# Patient Record
Sex: Male | Born: 1970
Health system: Southern US, Community
[De-identification: ages and names within clinical notes are randomized; demographics above are authoritative.]

## PROBLEM LIST (undated history)

## (undated) DIAGNOSIS — F319 Bipolar disorder, unspecified: Secondary | ICD-10-CM

## (undated) DIAGNOSIS — E669 Obesity, unspecified: Secondary | ICD-10-CM

## (undated) DIAGNOSIS — S82891A Other fracture of right lower leg, initial encounter for closed fracture: Secondary | ICD-10-CM

## (undated) DIAGNOSIS — I10 Essential (primary) hypertension: Secondary | ICD-10-CM

## (undated) DIAGNOSIS — E785 Hyperlipidemia, unspecified: Secondary | ICD-10-CM

## (undated) DIAGNOSIS — G4733 Obstructive sleep apnea (adult) (pediatric): Secondary | ICD-10-CM

## (undated) DIAGNOSIS — G510 Bell's palsy: Secondary | ICD-10-CM

## (undated) DIAGNOSIS — G4730 Sleep apnea, unspecified: Secondary | ICD-10-CM

## (undated) HISTORY — PX: VASECTOMY: SHX75

## (undated) HISTORY — PX: KELOID EXCISION: SHX1856

## (undated) HISTORY — DX: Sleep apnea, unspecified: G47.30

## (undated) HISTORY — DX: Hyperlipidemia, unspecified: E78.5

---

## 1999-07-06 ENCOUNTER — Emergency Department (HOSPITAL_COMMUNITY): Admission: EM | Admit: 1999-07-06 | Discharge: 1999-07-06 | Payer: Self-pay | Admitting: *Deleted

## 1999-08-02 ENCOUNTER — Emergency Department (HOSPITAL_COMMUNITY): Admission: EM | Admit: 1999-08-02 | Discharge: 1999-08-02 | Payer: Self-pay | Admitting: Emergency Medicine

## 1999-08-02 ENCOUNTER — Encounter: Payer: Self-pay | Admitting: Emergency Medicine

## 2000-03-12 ENCOUNTER — Emergency Department (HOSPITAL_COMMUNITY): Admission: EM | Admit: 2000-03-12 | Discharge: 2000-03-12 | Payer: Self-pay | Admitting: Emergency Medicine

## 2000-03-12 ENCOUNTER — Encounter: Payer: Self-pay | Admitting: Emergency Medicine

## 2000-03-20 ENCOUNTER — Encounter: Admission: RE | Admit: 2000-03-20 | Discharge: 2000-03-20 | Payer: Self-pay | Admitting: Internal Medicine

## 2000-05-30 ENCOUNTER — Encounter: Admission: RE | Admit: 2000-05-30 | Discharge: 2000-05-30 | Payer: Self-pay | Admitting: Internal Medicine

## 2000-06-02 ENCOUNTER — Encounter: Admission: RE | Admit: 2000-06-02 | Discharge: 2000-06-02 | Payer: Self-pay | Admitting: Internal Medicine

## 2000-06-09 ENCOUNTER — Encounter: Admission: RE | Admit: 2000-06-09 | Discharge: 2000-06-09 | Payer: Self-pay | Admitting: Internal Medicine

## 2000-06-14 ENCOUNTER — Emergency Department (HOSPITAL_COMMUNITY): Admission: EM | Admit: 2000-06-14 | Discharge: 2000-06-14 | Payer: Self-pay | Admitting: Emergency Medicine

## 2000-06-22 ENCOUNTER — Encounter: Admission: RE | Admit: 2000-06-22 | Discharge: 2000-06-22 | Payer: Self-pay | Admitting: Hematology and Oncology

## 2000-06-23 ENCOUNTER — Ambulatory Visit (HOSPITAL_COMMUNITY): Admission: RE | Admit: 2000-06-23 | Discharge: 2000-06-23 | Payer: Self-pay | Admitting: *Deleted

## 2000-07-06 ENCOUNTER — Encounter: Admission: RE | Admit: 2000-07-06 | Discharge: 2000-07-06 | Payer: Self-pay | Admitting: Hematology and Oncology

## 2000-07-09 ENCOUNTER — Emergency Department (HOSPITAL_COMMUNITY): Admission: EM | Admit: 2000-07-09 | Discharge: 2000-07-09 | Payer: Self-pay | Admitting: Emergency Medicine

## 2000-07-13 ENCOUNTER — Encounter: Admission: RE | Admit: 2000-07-13 | Discharge: 2000-07-13 | Payer: Self-pay | Admitting: Hematology and Oncology

## 2000-07-18 ENCOUNTER — Encounter: Payer: Self-pay | Admitting: *Deleted

## 2000-07-18 ENCOUNTER — Ambulatory Visit (HOSPITAL_COMMUNITY): Admission: RE | Admit: 2000-07-18 | Discharge: 2000-07-18 | Payer: Self-pay | Admitting: *Deleted

## 2000-07-19 ENCOUNTER — Emergency Department (HOSPITAL_COMMUNITY): Admission: EM | Admit: 2000-07-19 | Discharge: 2000-07-19 | Payer: Self-pay | Admitting: Emergency Medicine

## 2000-07-19 ENCOUNTER — Encounter: Payer: Self-pay | Admitting: Emergency Medicine

## 2000-08-09 ENCOUNTER — Encounter: Admission: RE | Admit: 2000-08-09 | Discharge: 2000-08-09 | Payer: Self-pay | Admitting: Internal Medicine

## 2000-08-22 ENCOUNTER — Encounter: Payer: Self-pay | Admitting: Emergency Medicine

## 2000-08-22 ENCOUNTER — Emergency Department (HOSPITAL_COMMUNITY): Admission: EM | Admit: 2000-08-22 | Discharge: 2000-08-22 | Payer: Self-pay | Admitting: Emergency Medicine

## 2000-11-27 ENCOUNTER — Emergency Department (HOSPITAL_COMMUNITY): Admission: EM | Admit: 2000-11-27 | Discharge: 2000-11-27 | Payer: Self-pay | Admitting: Emergency Medicine

## 2000-12-08 ENCOUNTER — Emergency Department (HOSPITAL_COMMUNITY): Admission: EM | Admit: 2000-12-08 | Discharge: 2000-12-08 | Payer: Self-pay | Admitting: Emergency Medicine

## 2000-12-08 ENCOUNTER — Encounter: Payer: Self-pay | Admitting: Emergency Medicine

## 2001-02-21 ENCOUNTER — Emergency Department (HOSPITAL_COMMUNITY): Admission: EM | Admit: 2001-02-21 | Discharge: 2001-02-21 | Payer: Self-pay | Admitting: Emergency Medicine

## 2001-02-21 ENCOUNTER — Encounter: Payer: Self-pay | Admitting: Emergency Medicine

## 2001-05-19 ENCOUNTER — Encounter: Payer: Self-pay | Admitting: Emergency Medicine

## 2001-05-19 ENCOUNTER — Emergency Department (HOSPITAL_COMMUNITY): Admission: EM | Admit: 2001-05-19 | Discharge: 2001-05-19 | Payer: Self-pay | Admitting: Emergency Medicine

## 2001-05-24 ENCOUNTER — Encounter: Admission: RE | Admit: 2001-05-24 | Discharge: 2001-05-24 | Payer: Self-pay | Admitting: Nephrology

## 2001-05-24 ENCOUNTER — Encounter: Admission: RE | Admit: 2001-05-24 | Discharge: 2001-05-24 | Payer: Self-pay | Admitting: Internal Medicine

## 2001-05-24 ENCOUNTER — Encounter: Payer: Self-pay | Admitting: Nephrology

## 2001-08-12 ENCOUNTER — Emergency Department (HOSPITAL_COMMUNITY): Admission: EM | Admit: 2001-08-12 | Discharge: 2001-08-12 | Payer: Self-pay | Admitting: Emergency Medicine

## 2001-08-12 ENCOUNTER — Encounter: Payer: Self-pay | Admitting: Emergency Medicine

## 2001-12-04 ENCOUNTER — Ambulatory Visit (HOSPITAL_COMMUNITY): Admission: RE | Admit: 2001-12-04 | Discharge: 2001-12-04 | Payer: Self-pay | Admitting: Neurology

## 2001-12-04 ENCOUNTER — Encounter: Payer: Self-pay | Admitting: Neurology

## 2001-12-24 ENCOUNTER — Emergency Department (HOSPITAL_COMMUNITY): Admission: EM | Admit: 2001-12-24 | Discharge: 2001-12-24 | Payer: Self-pay | Admitting: Emergency Medicine

## 2001-12-24 ENCOUNTER — Encounter: Payer: Self-pay | Admitting: Emergency Medicine

## 2001-12-26 ENCOUNTER — Encounter: Payer: Self-pay | Admitting: Neurology

## 2001-12-26 ENCOUNTER — Encounter (INDEPENDENT_AMBULATORY_CARE_PROVIDER_SITE_OTHER): Payer: Self-pay | Admitting: *Deleted

## 2001-12-26 ENCOUNTER — Ambulatory Visit (HOSPITAL_COMMUNITY): Admission: RE | Admit: 2001-12-26 | Discharge: 2001-12-26 | Payer: Self-pay | Admitting: Neurology

## 2002-02-24 ENCOUNTER — Emergency Department (HOSPITAL_COMMUNITY): Admission: EM | Admit: 2002-02-24 | Discharge: 2002-02-24 | Payer: Self-pay | Admitting: Emergency Medicine

## 2002-04-09 ENCOUNTER — Emergency Department (HOSPITAL_COMMUNITY): Admission: EM | Admit: 2002-04-09 | Discharge: 2002-04-09 | Payer: Self-pay | Admitting: Emergency Medicine

## 2002-05-20 ENCOUNTER — Emergency Department (HOSPITAL_COMMUNITY): Admission: EM | Admit: 2002-05-20 | Discharge: 2002-05-21 | Payer: Self-pay | Admitting: Emergency Medicine

## 2002-05-20 ENCOUNTER — Encounter: Payer: Self-pay | Admitting: Emergency Medicine

## 2002-07-17 ENCOUNTER — Emergency Department (HOSPITAL_COMMUNITY): Admission: EM | Admit: 2002-07-17 | Discharge: 2002-07-17 | Payer: Self-pay | Admitting: Emergency Medicine

## 2002-07-24 ENCOUNTER — Encounter: Admission: RE | Admit: 2002-07-24 | Discharge: 2002-07-24 | Payer: Self-pay | Admitting: Internal Medicine

## 2003-04-07 ENCOUNTER — Emergency Department (HOSPITAL_COMMUNITY): Admission: EM | Admit: 2003-04-07 | Discharge: 2003-04-07 | Payer: Self-pay | Admitting: Emergency Medicine

## 2003-04-07 ENCOUNTER — Encounter: Payer: Self-pay | Admitting: Emergency Medicine

## 2003-05-25 ENCOUNTER — Emergency Department (HOSPITAL_COMMUNITY): Admission: AD | Admit: 2003-05-25 | Discharge: 2003-05-25 | Payer: Self-pay | Admitting: Emergency Medicine

## 2003-05-25 ENCOUNTER — Encounter: Payer: Self-pay | Admitting: Emergency Medicine

## 2003-06-06 ENCOUNTER — Emergency Department (HOSPITAL_COMMUNITY): Admission: EM | Admit: 2003-06-06 | Discharge: 2003-06-07 | Payer: Self-pay | Admitting: Emergency Medicine

## 2003-06-07 ENCOUNTER — Encounter: Payer: Self-pay | Admitting: Emergency Medicine

## 2003-07-21 ENCOUNTER — Emergency Department (HOSPITAL_COMMUNITY): Admission: EM | Admit: 2003-07-21 | Discharge: 2003-07-21 | Payer: Self-pay | Admitting: Emergency Medicine

## 2004-05-14 ENCOUNTER — Inpatient Hospital Stay (HOSPITAL_COMMUNITY): Admission: EM | Admit: 2004-05-14 | Discharge: 2004-05-18 | Payer: Self-pay | Admitting: Psychiatry

## 2004-05-16 ENCOUNTER — Encounter: Payer: Self-pay | Admitting: Emergency Medicine

## 2004-06-09 ENCOUNTER — Inpatient Hospital Stay (HOSPITAL_COMMUNITY): Admission: EM | Admit: 2004-06-09 | Discharge: 2004-06-10 | Payer: Self-pay | Admitting: Emergency Medicine

## 2004-09-28 ENCOUNTER — Emergency Department (HOSPITAL_COMMUNITY): Admission: EM | Admit: 2004-09-28 | Discharge: 2004-09-29 | Payer: Self-pay | Admitting: *Deleted

## 2005-07-26 ENCOUNTER — Emergency Department (HOSPITAL_COMMUNITY): Admission: EM | Admit: 2005-07-26 | Discharge: 2005-07-26 | Payer: Self-pay | Admitting: Family Medicine

## 2005-08-18 ENCOUNTER — Emergency Department (HOSPITAL_COMMUNITY): Admission: EM | Admit: 2005-08-18 | Discharge: 2005-08-18 | Payer: Self-pay | Admitting: Emergency Medicine

## 2006-02-14 ENCOUNTER — Emergency Department (HOSPITAL_COMMUNITY): Admission: EM | Admit: 2006-02-14 | Discharge: 2006-02-14 | Payer: Self-pay | Admitting: Family Medicine

## 2006-05-25 ENCOUNTER — Ambulatory Visit: Payer: Self-pay | Admitting: Family Medicine

## 2006-05-26 ENCOUNTER — Ambulatory Visit: Payer: Self-pay | Admitting: Family Medicine

## 2006-06-07 ENCOUNTER — Ambulatory Visit: Payer: Self-pay

## 2006-06-07 ENCOUNTER — Encounter: Admission: RE | Admit: 2006-06-07 | Discharge: 2006-09-05 | Payer: Self-pay | Admitting: Family Medicine

## 2006-06-07 ENCOUNTER — Encounter: Payer: Self-pay | Admitting: Cardiovascular Disease

## 2006-06-22 ENCOUNTER — Ambulatory Visit: Payer: Self-pay | Admitting: Family Medicine

## 2006-06-28 ENCOUNTER — Emergency Department (HOSPITAL_COMMUNITY): Admission: EM | Admit: 2006-06-28 | Discharge: 2006-06-28 | Payer: Self-pay | Admitting: Family Medicine

## 2006-07-20 ENCOUNTER — Encounter: Payer: Self-pay | Admitting: Pulmonary Disease

## 2006-07-20 ENCOUNTER — Ambulatory Visit (HOSPITAL_BASED_OUTPATIENT_CLINIC_OR_DEPARTMENT_OTHER): Admission: RE | Admit: 2006-07-20 | Discharge: 2006-07-20 | Payer: Self-pay | Admitting: Family Medicine

## 2006-07-26 ENCOUNTER — Ambulatory Visit: Payer: Self-pay | Admitting: Pulmonary Disease

## 2006-08-11 ENCOUNTER — Ambulatory Visit: Payer: Self-pay | Admitting: Family Medicine

## 2006-08-11 LAB — CONVERTED CEMR LAB
ALT: 26 units/L (ref 0–40)
AST: 23 units/L (ref 0–37)
Albumin: 3.6 g/dL (ref 3.5–5.2)
Alkaline Phosphatase: 63 units/L (ref 39–117)
BUN: 13 mg/dL (ref 6–23)
CO2: 29 meq/L (ref 19–32)
GFR calc non Af Amer: 80 mL/min
Glomerular Filtration Rate, Af Am: 97 mL/min/{1.73_m2}
MCHC: 33.3 g/dL (ref 30.0–36.0)
Monocytes Relative: 9.9 % (ref 3.0–11.0)
Platelets: 304 10*3/uL (ref 150–400)
RBC: 4.89 M/uL (ref 4.22–5.81)
RDW: 12.8 % (ref 11.5–14.6)
Total Bilirubin: 0.8 mg/dL (ref 0.3–1.2)
Total Protein: 7.6 g/dL (ref 6.0–8.3)

## 2006-08-31 ENCOUNTER — Ambulatory Visit: Payer: Self-pay | Admitting: Pulmonary Disease

## 2006-09-14 ENCOUNTER — Ambulatory Visit: Payer: Self-pay | Admitting: Family Medicine

## 2006-10-10 ENCOUNTER — Ambulatory Visit: Payer: Self-pay | Admitting: Family Medicine

## 2006-10-23 ENCOUNTER — Ambulatory Visit: Payer: Self-pay | Admitting: Family Medicine

## 2006-11-15 ENCOUNTER — Ambulatory Visit: Admission: RE | Admit: 2006-11-15 | Discharge: 2006-12-16 | Payer: Self-pay | Admitting: Radiation Oncology

## 2006-12-05 ENCOUNTER — Encounter (INDEPENDENT_AMBULATORY_CARE_PROVIDER_SITE_OTHER): Payer: Self-pay | Admitting: Specialist

## 2006-12-05 ENCOUNTER — Ambulatory Visit (HOSPITAL_BASED_OUTPATIENT_CLINIC_OR_DEPARTMENT_OTHER): Admission: RE | Admit: 2006-12-05 | Discharge: 2006-12-05 | Payer: Self-pay | Admitting: Otolaryngology

## 2006-12-28 ENCOUNTER — Ambulatory Visit: Payer: Self-pay | Admitting: Family Medicine

## 2007-01-03 ENCOUNTER — Encounter: Admission: RE | Admit: 2007-01-03 | Discharge: 2007-01-03 | Payer: Self-pay | Admitting: Family Medicine

## 2007-02-23 ENCOUNTER — Ambulatory Visit: Payer: Self-pay | Admitting: Family Medicine

## 2007-02-26 DIAGNOSIS — F209 Schizophrenia, unspecified: Secondary | ICD-10-CM | POA: Insufficient documentation

## 2007-02-26 DIAGNOSIS — F319 Bipolar disorder, unspecified: Secondary | ICD-10-CM | POA: Insufficient documentation

## 2007-02-26 DIAGNOSIS — G4730 Sleep apnea, unspecified: Secondary | ICD-10-CM | POA: Insufficient documentation

## 2007-02-27 ENCOUNTER — Encounter: Payer: Self-pay | Admitting: Family Medicine

## 2007-03-01 ENCOUNTER — Telehealth (INDEPENDENT_AMBULATORY_CARE_PROVIDER_SITE_OTHER): Payer: Self-pay | Admitting: *Deleted

## 2007-03-01 DIAGNOSIS — R809 Proteinuria, unspecified: Secondary | ICD-10-CM | POA: Insufficient documentation

## 2007-03-01 DIAGNOSIS — IMO0002 Reserved for concepts with insufficient information to code with codable children: Secondary | ICD-10-CM | POA: Insufficient documentation

## 2007-03-01 DIAGNOSIS — E1165 Type 2 diabetes mellitus with hyperglycemia: Secondary | ICD-10-CM

## 2007-03-01 LAB — CONVERTED CEMR LAB
AST: 20 units/L (ref 0–37)
Bilirubin, Direct: 0.1 mg/dL (ref 0.0–0.3)
Chloride: 108 meq/L (ref 96–112)
GFR calc non Af Amer: 101 mL/min
Glucose, Bld: 102 mg/dL — ABNORMAL HIGH (ref 70–99)
Hgb A1c MFr Bld: 6.6 % — ABNORMAL HIGH (ref 4.6–6.0)
Protein, Ur: 2050 mg/24hr — ABNORMAL HIGH (ref 50–100)
Sodium: 142 meq/L (ref 135–145)

## 2007-03-16 ENCOUNTER — Telehealth (INDEPENDENT_AMBULATORY_CARE_PROVIDER_SITE_OTHER): Payer: Self-pay | Admitting: *Deleted

## 2007-03-16 DIAGNOSIS — R079 Chest pain, unspecified: Secondary | ICD-10-CM | POA: Insufficient documentation

## 2007-03-27 ENCOUNTER — Telehealth (INDEPENDENT_AMBULATORY_CARE_PROVIDER_SITE_OTHER): Payer: Self-pay | Admitting: *Deleted

## 2007-03-29 ENCOUNTER — Ambulatory Visit: Payer: Self-pay | Admitting: Pulmonary Disease

## 2007-04-10 ENCOUNTER — Encounter: Payer: Self-pay | Admitting: Family Medicine

## 2007-04-19 ENCOUNTER — Encounter: Payer: Self-pay | Admitting: Family Medicine

## 2007-05-06 ENCOUNTER — Ambulatory Visit: Payer: Self-pay | Admitting: Psychiatry

## 2007-05-06 ENCOUNTER — Inpatient Hospital Stay (HOSPITAL_COMMUNITY): Admission: AD | Admit: 2007-05-06 | Discharge: 2007-05-08 | Payer: Self-pay | Admitting: Psychiatry

## 2007-05-06 ENCOUNTER — Emergency Department (HOSPITAL_COMMUNITY): Admission: EM | Admit: 2007-05-06 | Discharge: 2007-05-06 | Payer: Self-pay | Admitting: Emergency Medicine

## 2007-05-19 ENCOUNTER — Emergency Department (HOSPITAL_COMMUNITY): Admission: EM | Admit: 2007-05-19 | Discharge: 2007-05-19 | Payer: Self-pay | Admitting: Family Medicine

## 2007-05-22 ENCOUNTER — Encounter: Admission: RE | Admit: 2007-05-22 | Discharge: 2007-05-22 | Payer: Self-pay | Admitting: Nephrology

## 2007-05-25 ENCOUNTER — Telehealth (INDEPENDENT_AMBULATORY_CARE_PROVIDER_SITE_OTHER): Payer: Self-pay | Admitting: *Deleted

## 2007-05-30 ENCOUNTER — Encounter (INDEPENDENT_AMBULATORY_CARE_PROVIDER_SITE_OTHER): Payer: Self-pay | Admitting: *Deleted

## 2007-06-18 ENCOUNTER — Encounter: Payer: Self-pay | Admitting: Family Medicine

## 2007-07-02 ENCOUNTER — Emergency Department (HOSPITAL_COMMUNITY): Admission: EM | Admit: 2007-07-02 | Discharge: 2007-07-02 | Payer: Self-pay | Admitting: Emergency Medicine

## 2007-07-18 ENCOUNTER — Telehealth: Payer: Self-pay | Admitting: Family Medicine

## 2007-10-03 ENCOUNTER — Telehealth (INDEPENDENT_AMBULATORY_CARE_PROVIDER_SITE_OTHER): Payer: Self-pay | Admitting: *Deleted

## 2007-10-31 ENCOUNTER — Telehealth (INDEPENDENT_AMBULATORY_CARE_PROVIDER_SITE_OTHER): Payer: Self-pay | Admitting: *Deleted

## 2007-11-01 ENCOUNTER — Encounter: Payer: Self-pay | Admitting: Family Medicine

## 2007-11-01 ENCOUNTER — Encounter (INDEPENDENT_AMBULATORY_CARE_PROVIDER_SITE_OTHER): Payer: Self-pay | Admitting: *Deleted

## 2007-11-26 ENCOUNTER — Telehealth: Payer: Self-pay | Admitting: Family Medicine

## 2008-03-12 ENCOUNTER — Encounter: Payer: Self-pay | Admitting: Family Medicine

## 2008-05-13 ENCOUNTER — Telehealth (INDEPENDENT_AMBULATORY_CARE_PROVIDER_SITE_OTHER): Payer: Self-pay | Admitting: *Deleted

## 2008-08-18 ENCOUNTER — Emergency Department (HOSPITAL_COMMUNITY): Admission: EM | Admit: 2008-08-18 | Discharge: 2008-08-18 | Payer: Self-pay | Admitting: Family Medicine

## 2008-09-23 ENCOUNTER — Emergency Department (HOSPITAL_COMMUNITY): Admission: EM | Admit: 2008-09-23 | Discharge: 2008-09-23 | Payer: Self-pay | Admitting: Family Medicine

## 2008-10-17 ENCOUNTER — Ambulatory Visit: Payer: Self-pay | Admitting: Family Medicine

## 2008-10-18 ENCOUNTER — Encounter: Payer: Self-pay | Admitting: Family Medicine

## 2008-10-22 ENCOUNTER — Encounter (INDEPENDENT_AMBULATORY_CARE_PROVIDER_SITE_OTHER): Payer: Self-pay | Admitting: *Deleted

## 2008-10-30 ENCOUNTER — Encounter: Payer: Self-pay | Admitting: Family Medicine

## 2008-11-03 ENCOUNTER — Telehealth (INDEPENDENT_AMBULATORY_CARE_PROVIDER_SITE_OTHER): Payer: Self-pay | Admitting: *Deleted

## 2008-11-03 ENCOUNTER — Encounter (INDEPENDENT_AMBULATORY_CARE_PROVIDER_SITE_OTHER): Payer: Self-pay | Admitting: *Deleted

## 2008-11-14 ENCOUNTER — Ambulatory Visit: Payer: Self-pay | Admitting: Family Medicine

## 2008-11-14 DIAGNOSIS — E785 Hyperlipidemia, unspecified: Secondary | ICD-10-CM | POA: Insufficient documentation

## 2008-11-18 ENCOUNTER — Encounter (INDEPENDENT_AMBULATORY_CARE_PROVIDER_SITE_OTHER): Payer: Self-pay | Admitting: *Deleted

## 2008-11-18 ENCOUNTER — Encounter: Payer: Self-pay | Admitting: Family Medicine

## 2008-11-26 LAB — CONVERTED CEMR LAB: Protein, Ur: 2760 mg/24hr — ABNORMAL HIGH (ref 50–100)

## 2008-11-27 ENCOUNTER — Telehealth (INDEPENDENT_AMBULATORY_CARE_PROVIDER_SITE_OTHER): Payer: Self-pay | Admitting: *Deleted

## 2009-01-19 ENCOUNTER — Encounter: Payer: Self-pay | Admitting: Family Medicine

## 2009-02-04 ENCOUNTER — Encounter: Payer: Self-pay | Admitting: Family Medicine

## 2009-02-04 ENCOUNTER — Telehealth (INDEPENDENT_AMBULATORY_CARE_PROVIDER_SITE_OTHER): Payer: Self-pay | Admitting: *Deleted

## 2009-02-06 ENCOUNTER — Encounter: Payer: Self-pay | Admitting: Family Medicine

## 2009-03-13 ENCOUNTER — Emergency Department (HOSPITAL_COMMUNITY): Admission: EM | Admit: 2009-03-13 | Discharge: 2009-03-13 | Payer: Self-pay | Admitting: Emergency Medicine

## 2009-03-13 ENCOUNTER — Emergency Department (HOSPITAL_COMMUNITY): Admission: EM | Admit: 2009-03-13 | Discharge: 2009-03-13 | Payer: Self-pay | Admitting: Family Medicine

## 2009-03-16 ENCOUNTER — Telehealth (INDEPENDENT_AMBULATORY_CARE_PROVIDER_SITE_OTHER): Payer: Self-pay | Admitting: *Deleted

## 2009-03-17 ENCOUNTER — Telehealth (INDEPENDENT_AMBULATORY_CARE_PROVIDER_SITE_OTHER): Payer: Self-pay | Admitting: *Deleted

## 2009-03-19 ENCOUNTER — Encounter: Payer: Self-pay | Admitting: Family Medicine

## 2009-03-23 ENCOUNTER — Emergency Department (HOSPITAL_COMMUNITY): Admission: EM | Admit: 2009-03-23 | Discharge: 2009-03-23 | Payer: Self-pay | Admitting: Family Medicine

## 2009-05-18 ENCOUNTER — Emergency Department (HOSPITAL_COMMUNITY): Admission: EM | Admit: 2009-05-18 | Discharge: 2009-05-18 | Payer: Self-pay | Admitting: Family Medicine

## 2009-06-16 ENCOUNTER — Ambulatory Visit: Payer: Self-pay | Admitting: Family Medicine

## 2009-06-16 DIAGNOSIS — S93409A Sprain of unspecified ligament of unspecified ankle, initial encounter: Secondary | ICD-10-CM | POA: Insufficient documentation

## 2009-06-16 DIAGNOSIS — M1A0710 Idiopathic chronic gout, right ankle and foot, without tophus (tophi): Secondary | ICD-10-CM | POA: Insufficient documentation

## 2009-06-16 DIAGNOSIS — L02818 Cutaneous abscess of other sites: Secondary | ICD-10-CM | POA: Insufficient documentation

## 2009-06-16 DIAGNOSIS — L03818 Cellulitis of other sites: Secondary | ICD-10-CM

## 2009-06-17 ENCOUNTER — Telehealth (INDEPENDENT_AMBULATORY_CARE_PROVIDER_SITE_OTHER): Payer: Self-pay | Admitting: *Deleted

## 2009-06-17 ENCOUNTER — Encounter: Payer: Self-pay | Admitting: Family Medicine

## 2009-06-23 ENCOUNTER — Encounter: Payer: Self-pay | Admitting: Family Medicine

## 2009-06-24 ENCOUNTER — Telehealth: Payer: Self-pay | Admitting: Family Medicine

## 2009-06-24 LAB — CONVERTED CEMR LAB
ALT: 16 units/L (ref 0–53)
BUN: 9 mg/dL (ref 6–23)
Basophils Absolute: 0.1 10*3/uL (ref 0.0–0.1)
Bilirubin, Direct: 0.1 mg/dL (ref 0.0–0.3)
Chloride: 105 meq/L (ref 96–112)
Cholesterol: 242 mg/dL — ABNORMAL HIGH (ref 0–200)
Creatinine, Ser: 0.9 mg/dL (ref 0.4–1.5)
Eosinophils Absolute: 0.2 10*3/uL (ref 0.0–0.7)
Eosinophils Relative: 5.1 % — ABNORMAL HIGH (ref 0.0–5.0)
Glucose, Bld: 84 mg/dL (ref 70–99)
HCT: 43 % (ref 39.0–52.0)
Hgb A1c MFr Bld: 6.2 % (ref 4.6–6.5)
Lymphs Abs: 2.5 10*3/uL (ref 0.7–4.0)
MCHC: 32.6 g/dL (ref 30.0–36.0)
MCV: 88.3 fL (ref 78.0–100.0)
Microalb Creat Ratio: 695.4 mg/g — ABNORMAL HIGH (ref 0.0–30.0)
Microalb, Ur: 240.8 mg/dL — ABNORMAL HIGH (ref 0.0–1.9)
Monocytes Absolute: 0.9 10*3/uL (ref 0.1–1.0)
Neutrophils Relative %: 17 % — ABNORMAL LOW (ref 43.0–77.0)
Platelets: 207 10*3/uL (ref 150.0–400.0)
Potassium: 4.5 meq/L (ref 3.5–5.1)
RDW: 13 % (ref 11.5–14.6)
TSH: 2.7 microintl units/mL (ref 0.35–5.50)
Total Bilirubin: 0.8 mg/dL (ref 0.3–1.2)
Uric Acid, Serum: 9.3 mg/dL — ABNORMAL HIGH (ref 4.0–7.8)
VLDL: 51.6 mg/dL — ABNORMAL HIGH (ref 0.0–40.0)
WBC: 4.5 10*3/uL (ref 4.5–10.5)

## 2010-03-24 ENCOUNTER — Telehealth (INDEPENDENT_AMBULATORY_CARE_PROVIDER_SITE_OTHER): Payer: Self-pay | Admitting: *Deleted

## 2010-03-30 ENCOUNTER — Ambulatory Visit: Payer: Self-pay | Admitting: Family Medicine

## 2010-03-30 DIAGNOSIS — IMO0001 Reserved for inherently not codable concepts without codable children: Secondary | ICD-10-CM | POA: Insufficient documentation

## 2010-03-30 DIAGNOSIS — N529 Male erectile dysfunction, unspecified: Secondary | ICD-10-CM | POA: Insufficient documentation

## 2010-03-31 ENCOUNTER — Encounter: Payer: Self-pay | Admitting: Family Medicine

## 2010-04-01 ENCOUNTER — Telehealth (INDEPENDENT_AMBULATORY_CARE_PROVIDER_SITE_OTHER): Payer: Self-pay | Admitting: *Deleted

## 2010-04-01 DIAGNOSIS — R799 Abnormal finding of blood chemistry, unspecified: Secondary | ICD-10-CM | POA: Insufficient documentation

## 2010-04-01 LAB — CONVERTED CEMR LAB
Anti Nuclear Antibody(ANA): POSITIVE — AB
Rhuematoid fact SerPl-aCnc: <20 intl units/mL (ref 0–20)

## 2010-04-02 ENCOUNTER — Encounter: Payer: Self-pay | Admitting: Family Medicine

## 2010-04-06 ENCOUNTER — Telehealth (INDEPENDENT_AMBULATORY_CARE_PROVIDER_SITE_OTHER): Payer: Self-pay | Admitting: *Deleted

## 2010-04-06 ENCOUNTER — Encounter: Payer: Self-pay | Admitting: Family Medicine

## 2010-04-06 LAB — CONVERTED CEMR LAB
ALT: 20 units/L (ref 0–53)
Albumin: 3.5 g/dL (ref 3.5–5.2)
Alkaline Phosphatase: 71 units/L (ref 39–117)
Basophils Relative: 0.4 % (ref 0.0–3.0)
Bilirubin, Direct: 0.1 mg/dL (ref 0.0–0.3)
CO2: 27 meq/L (ref 19–32)
Calcium: 9 mg/dL (ref 8.4–10.5)
Chloride: 108 meq/L (ref 96–112)
Cholesterol: 240 mg/dL — ABNORMAL HIGH (ref 0–200)
Creatinine, Ser: 0.8 mg/dL (ref 0.4–1.5)
Creatinine,U: 219.5 mg/dL
Direct LDL: 125.1 mg/dL
Eosinophils Relative: 3.6 % (ref 0.0–5.0)
Hemoglobin: 13.9 g/dL (ref 13.0–17.0)
Lymphocytes Relative: 22 % (ref 12.0–46.0)
MCV: 88.3 fL (ref 78.0–100.0)
Microalb Creat Ratio: 61 mg/g — ABNORMAL HIGH (ref 0.0–30.0)
Neutro Abs: 5.3 10*3/uL (ref 1.4–7.7)
Neutrophils Relative %: 64.7 % (ref 43.0–77.0)
RBC: 4.63 M/uL (ref 4.22–5.81)
Sed Rate: 21 mm/hr (ref 0–22)
Sodium: 140 meq/L (ref 135–145)
Total CHOL/HDL Ratio: 7
Total Protein: 7.3 g/dL (ref 6.0–8.3)
VLDL: 60.4 mg/dL — ABNORMAL HIGH (ref 0.0–40.0)
WBC: 8.2 10*3/uL (ref 4.5–10.5)

## 2010-04-08 ENCOUNTER — Encounter: Payer: Self-pay | Admitting: Family Medicine

## 2010-04-08 ENCOUNTER — Telehealth: Payer: Self-pay | Admitting: Family Medicine

## 2010-04-20 ENCOUNTER — Encounter: Payer: Self-pay | Admitting: Family Medicine

## 2010-04-22 ENCOUNTER — Encounter: Payer: Self-pay | Admitting: Family Medicine

## 2010-05-18 ENCOUNTER — Encounter: Payer: Self-pay | Admitting: Cardiovascular Disease

## 2010-05-18 ENCOUNTER — Emergency Department (HOSPITAL_COMMUNITY): Admission: EM | Admit: 2010-05-18 | Discharge: 2010-05-18 | Payer: Self-pay | Admitting: Family Medicine

## 2010-05-19 ENCOUNTER — Ambulatory Visit: Payer: Self-pay | Admitting: Cardiovascular Disease

## 2010-05-19 DIAGNOSIS — R55 Syncope and collapse: Secondary | ICD-10-CM | POA: Insufficient documentation

## 2010-05-19 DIAGNOSIS — R072 Precordial pain: Secondary | ICD-10-CM | POA: Insufficient documentation

## 2010-05-20 ENCOUNTER — Telehealth (INDEPENDENT_AMBULATORY_CARE_PROVIDER_SITE_OTHER): Payer: Self-pay | Admitting: *Deleted

## 2010-06-06 ENCOUNTER — Emergency Department (HOSPITAL_COMMUNITY): Admission: EM | Admit: 2010-06-06 | Discharge: 2010-06-06 | Payer: Self-pay | Admitting: Emergency Medicine

## 2010-06-07 ENCOUNTER — Emergency Department (HOSPITAL_COMMUNITY): Admission: EM | Admit: 2010-06-07 | Discharge: 2010-06-07 | Payer: Self-pay | Admitting: Emergency Medicine

## 2010-06-09 ENCOUNTER — Encounter: Payer: Self-pay | Admitting: Family Medicine

## 2010-06-17 ENCOUNTER — Emergency Department (HOSPITAL_COMMUNITY): Admission: EM | Admit: 2010-06-17 | Discharge: 2010-06-17 | Payer: Self-pay | Admitting: Emergency Medicine

## 2010-07-29 ENCOUNTER — Encounter: Payer: Self-pay | Admitting: Family Medicine

## 2010-07-29 ENCOUNTER — Ambulatory Visit: Payer: Self-pay | Admitting: Family Medicine

## 2010-08-03 ENCOUNTER — Encounter: Payer: Self-pay | Admitting: Cardiovascular Disease

## 2010-08-03 ENCOUNTER — Ambulatory Visit (HOSPITAL_COMMUNITY): Admission: RE | Admit: 2010-08-03 | Discharge: 2010-08-03 | Payer: Self-pay | Admitting: Cardiovascular Disease

## 2010-08-03 ENCOUNTER — Ambulatory Visit: Payer: Self-pay | Admitting: Cardiology

## 2010-08-03 ENCOUNTER — Encounter: Payer: Self-pay | Admitting: Family Medicine

## 2010-08-04 ENCOUNTER — Ambulatory Visit: Payer: Self-pay | Admitting: Family Medicine

## 2010-08-10 LAB — CONVERTED CEMR LAB
Basophils Relative: 0.5 % (ref 0.0–3.0)
Eosinophils Absolute: 0.3 10*3/uL (ref 0.0–0.7)
Eosinophils Relative: 3.9 % (ref 0.0–5.0)
HCT: 39.2 % (ref 39.0–52.0)
Hemoglobin: 13.1 g/dL (ref 13.0–17.0)
Lymphs Abs: 1.7 10*3/uL (ref 0.7–4.0)
MCHC: 33.4 g/dL (ref 30.0–36.0)
MCV: 88.7 fL (ref 78.0–100.0)
Monocytes Absolute: 0.7 10*3/uL (ref 0.1–1.0)
Neutro Abs: 6 10*3/uL (ref 1.4–7.7)
RBC: 4.41 M/uL (ref 4.22–5.81)

## 2010-08-13 ENCOUNTER — Ambulatory Visit: Payer: Self-pay | Admitting: Pulmonary Disease

## 2010-08-13 DIAGNOSIS — G4733 Obstructive sleep apnea (adult) (pediatric): Secondary | ICD-10-CM | POA: Insufficient documentation

## 2010-09-06 ENCOUNTER — Ambulatory Visit: Payer: Self-pay | Admitting: Family Medicine

## 2010-09-24 ENCOUNTER — Encounter: Payer: Self-pay | Admitting: Family Medicine

## 2010-09-29 ENCOUNTER — Encounter: Payer: Self-pay | Admitting: Family Medicine

## 2010-09-30 ENCOUNTER — Encounter: Payer: Self-pay | Admitting: Family Medicine

## 2010-10-12 ENCOUNTER — Encounter: Payer: Self-pay | Admitting: Family Medicine

## 2010-10-16 ENCOUNTER — Encounter: Payer: Self-pay | Admitting: Cardiovascular Disease

## 2010-10-20 ENCOUNTER — Telehealth (INDEPENDENT_AMBULATORY_CARE_PROVIDER_SITE_OTHER): Payer: Self-pay | Admitting: *Deleted

## 2010-10-24 LAB — CONVERTED CEMR LAB
ALT: 23 units/L (ref 0–53)
AST: 19 units/L (ref 0–37)
Albumin: 3.4 g/dL — ABNORMAL LOW (ref 3.5–5.2)
Alkaline Phosphatase: 68 units/L (ref 39–117)
BUN: 7 mg/dL (ref 6–23)
Basophils Relative: 0.7 % (ref 0.0–3.0)
CO2: 29 meq/L (ref 19–32)
Chloride: 102 meq/L (ref 96–112)
Creatinine, Ser: 0.9 mg/dL (ref 0.4–1.5)
Creatinine,U: 245.5 mg/dL
Direct LDL: 146.7 mg/dL
Eosinophils Relative: 2.8 % (ref 0.0–5.0)
HDL: 27.1 mg/dL — ABNORMAL LOW (ref >39.0)
Lymphocytes Relative: 18.2 % (ref 12.0–46.0)
Neutrophils Relative %: 71.2 % (ref 43.0–77.0)
Nitrite: NEGATIVE
Potassium: 4.2 meq/L (ref 3.5–5.1)
Protein, U semiquant: >=300
RBC: 4.83 M/uL (ref 4.22–5.81)
Specific Gravity, Urine: >=1.03
TSH: 2.88 microintl units/mL (ref 0.35–5.50)
Total Bilirubin: 0.9 mg/dL (ref 0.3–1.2)
Total CHOL/HDL Ratio: 9.3
Urobilinogen, UA: NEGATIVE
VLDL: 63 mg/dL — ABNORMAL HIGH (ref 0–40)
WBC Urine, dipstick: NEGATIVE
WBC: 9.9 10*3/uL (ref 4.5–10.5)

## 2010-10-26 NOTE — Miscellaneous (Signed)
Summary: Appointment Canceled  Appointment status changed to canceled by [employer] on 06/09/2010 2:50 PM.  Cancellation Comments --------------------- wt 400/c r/s/780.2/mcalhany/humana/prec. req/ echo @ 10:30/saf  Appointment Information ----------------------- Appt Type:  CARDIOLOGY NUCLEAR TESTING      Date:  Thursday, June 10, 2010      Time:  8:15 AM for 15 min   Urgency:  Routine   Made By:  Pearson Grippe  To Visit:  [id number]    Reason:  wt 400/c r/s/780.2/mcalhany/humana/prec. req/ echo @ 10:30/saf  Appt Comments ------------- -- 06/09/10 14:50: ([facility]) CANCELED -- wt 400/c r/s/780.2/mcalhany/humana/prec. req/ echo @ 10:30/saf -- 06/09/10 12:28: ([facility]) BOOKED -- Routine CARDIOLOGY NUCLEAR TESTING at 06/10/2010 8:15 AM for 15 min wt 400/c r/s/780.2/mcalhany/humana/prec. req/ ec

## 2010-10-26 NOTE — Medication Information (Signed)
Summary: Diabetes Supplies/[facility]  Diabetes Supplies/[facility]   Imported By: Lanelle Bal 04/06/2010 12:43:33  _____________________________________________________________________  External Attachment:    Type:   Image     Comment:   External Document

## 2010-10-26 NOTE — Assessment & Plan Note (Signed)
Summary: PAINS IN JOINTS--FEET, ELBOW, NECK; ALSO SEE FOR ED, FASTING ...   Vital Signs:  Patient profile:   40 year old male Height:      75.5 inches Weight:      405.25 pounds BMI:     50.16 O2 Sat:      97 % on Room air Temp:     98.3 degrees F oral Pulse rate:   84 / minute BP sitting:   134 / 88  (right arm) Cuff size:   large  O2 Flow:  Room air CC: Est pt c/o joint pain x 2 weeks./kb Is Patient Diabetic? Yes Pain Assessment Patient in pain? yes     Location: knee Intensity: 4 Type: aching Onset of pain  6 months ago but worse x2 weeks Comments Patient also c/o inability to perform and neck spams nightly. Patient is taking only one Crestor, but is unsure of dosage./kb   History of Present Illness: Pt here c/o joint pains for about 6 months but the last 2 weeks it has been worse.  Pain is mostly in knees but is also in forearms and he c/o spasms in neck about 3 days a week.  It lasts 10 min and then goes away.    Type 1 diabetes mellitus follow-up      This is a 40 year old man who presents with Type 2 diabetes mellitus follow-up.  The patient denies polyuria, polydipsia, blurred vision, self managed hypoglycemia, hypoglycemia requiring help, weight loss, weight gain, and numbness of extremities.  The patient denies the following symptoms: neuropathic pain, chest pain, vomiting, orthostatic symptoms, poor wound healing, intermittent claudication, vision loss, and foot ulcer.  Since the last visit the patient reports good dietary compliance, compliance with medications, exercising regularly, and monitoring blood glucose.  The patient has been measuring capillary blood glucose before breakfast and at bedtime.  Since the last visit, the patient reports having had eye care by an ophthalmologist and foot care by a podiatrist.  Complications from diabetes include sexual dysfunction.    Hyperlipidemia follow-up      The patient also presents for Hyperlipidemia follow-up.  The  patient complains of muscle aches, but denies GI upset, abdominal pain, flushing, itching, constipation, diarrhea, and fatigue.  The patient denies the following symptoms: chest pain/pressure, exercise intolerance, dypsnea, palpitations, syncope, and pedal edema.  Compliance with medications (by patient report) has been near 100%.  Dietary compliance has been good.  The patient reports exercising 3-4X per week.    Hypertension follow-up      The patient also presents for Hypertension follow-up.  The patient complains of impotence, but denies lightheadedness, urinary frequency, headaches, edema, rash, and fatigue.  The patient denies the following associated symptoms: chest pain, chest pressure, exercise intolerance, dyspnea, palpitations, syncope, leg edema, and pedal edema.  Compliance with medications (by patient report) has been near 100%.  The patient reports that dietary compliance has been good.  The patient reports exercising 3-4X per week.  Adjunctive measures currently used by the patient include salt restriction.    Current Medications (verified): 1)  Freestyle Lite   Strp (Glucose Blood) .... Accu Check Tid 2)  Januvia 100 Mg Tabs (Sitagliptin Phosphate) .Marland Kitchen.. 1 By Mouth Once Daily 3)  Lisinopril 10 Mg Tabs (Lisinopril) .... Take 1 By Mouth Once Daily 4)  Crestor 40 Mg Tabs (Rosuvastatin Calcium) .Marland Kitchen.. 1 By Mouth At Bedtime 5)  Levitra 20 Mg Tabs (Vardenafil Hcl) .... As Directed  Allergies (verified): No Known  Drug Allergies  Past History:  Past medical, surgical, family and social histories (including risk factors) reviewed for relevance to current acute and chronic problems.  Past Medical History: Reviewed history from 11/14/2008 and no changes required. ? Lupus Diabetes mellitus, type II Current Problems:  BIPOLAR DISORDER UNSPECIFIED (ICD-296.80) DIABETES MELLITUS, TYPE II (ICD-250.00) CHEST PAIN, ACUTE (ICD-786.50) DM, UNCOMPLICATED, TYPE II (ICD-250.00) PROTEINURIA  (ICD-791.0) FAMILY HISTORY DIABETES 1ST DEGREE RELATIVE (ICD-V18.0) SLEEP APNEA (ICD-780.57) DISORDER, BIPOLAR NOS (ICD-296.80) SCHIZOPHRENIA (ICD-295.90) Hyperlipidemia  Past Surgical History: Reviewed history from 10/17/2008 and no changes required. keloid surgery Vasectomy  Family History: Reviewed history from 10/17/2008 and no changes required. Family History of Arthritis Family History Diabetes 1st degree relative Family History Hypertension Family History Other cancer-Throat Family History Heart Attack Family History Kidney Failure F--Pancreatic Cancer  Social History: Reviewed history from 10/17/2008 and no changes required. Married Current Smoker Occupation: disability Alcohol use-no Drug use-no Regular exercise-yes  Review of Systems      See HPI  Physical Exam  General:  Well-developed,well-nourished,in no acute distress; alert,appropriate and cooperative throughout examination Neck:  No deformities, masses, or tenderness noted. Lungs:  Normal respiratory effort, chest expands symmetrically. Lungs are clear to auscultation, no crackles or wheezes. Heart:  normal rate and no murmur.   Msk:  normal ROM, no joint tenderness, no joint swelling, no joint warmth, and no redness over joints.   Extremities:  No clubbing, cyanosis, edema, or deformity noted with normal full range of motion of all joints.    Diabetes Management Exam:    Foot Exam (with socks and/or shoes not present):       Sensory-Pinprick/Light touch:          Left medial foot (L-4): normal          Left dorsal foot (L-5): normal          Left lateral foot (S-1): normal          Right medial foot (L-4): normal          Right dorsal foot (L-5): normal          Right lateral foot (S-1): normal       Sensory-Monofilament:          Left foot: normal          Right foot: normal       Inspection:          Left foot: normal          Right foot: normal       Nails:          Left foot: thickened           Right foot: normal    Eye Exam:       Eye Exam done elsewhere   Impression & Recommendations:  Problem # 1:  MUSCLE PAIN (ICD-729.1) stop crestor for 2 weeks--we may need to change meds  tylenol arthritis  rto as needed  Orders: Venipuncture (96045) TLB-Lipid Panel (80061-LIPID) TLB-BMP (Basic Metabolic Panel-BMET) (80048-METABOL) TLB-CBC Platelet - w/Differential (85025-CBCD) TLB-Hepatic/Liver Function Pnl (80076-HEPATIC) TLB-TSH (Thyroid Stimulating Hormone) (84443-TSH) TLB-Uric Acid, Blood (84550-URIC) TLB-CK Total Only(Creatine Kinase/CPK) (82550-CK) TLB-A1C / Hgb A1C (Glycohemoglobin) (83036-A1C) TLB-Microalbumin/Creat Ratio, Urine (82043-MALB) TLB-Sedimentation Rate (ESR) (85652-ESR) T-Antinuclear Antib (ANA) (40981-19147) T-Vitamin D (25-Hydroxy) (82956-21308)  Problem # 2:  ERECTILE DYSFUNCTION, ORGANIC (ICD-607.84)  His updated medication list for this problem includes:    Levitra 20 Mg Tabs (Vardenafil hcl) .Marland Kitchen... As directed  Orders: Urology Referral (Urology)  Discussed proper  use of medications, as well as side effects.   Problem # 3:  GOUT, UNSPECIFIED (ICD-274.9)  Orders: Venipuncture (56213) TLB-Lipid Panel (80061-LIPID) TLB-BMP (Basic Metabolic Panel-BMET) (80048-METABOL) TLB-CBC Platelet - w/Differential (85025-CBCD) TLB-Hepatic/Liver Function Pnl (80076-HEPATIC) TLB-TSH (Thyroid Stimulating Hormone) (84443-TSH) TLB-Uric Acid, Blood (84550-URIC) TLB-CK Total Only(Creatine Kinase/CPK) (82550-CK) TLB-A1C / Hgb A1C (Glycohemoglobin) (83036-A1C) TLB-Microalbumin/Creat Ratio, Urine (82043-MALB)  Elevate extremity; warm compresses, symptomatic relief and medication as directed.   Problem # 4:  HYPERLIPIDEMIA (ICD-272.4)  The following medications were removed from the medication list:    Crestor 20 Mg Tabs (Rosuvastatin calcium) .Marland Kitchen... Take 1 by mouth at bedtime**labs due now** His updated medication list for this problem includes:    Crestor  40 Mg Tabs (Rosuvastatin calcium) .Marland Kitchen... 1 by mouth at bedtime  Orders: Venipuncture (08657) TLB-Lipid Panel (80061-LIPID) TLB-BMP (Basic Metabolic Panel-BMET) (80048-METABOL) TLB-CBC Platelet - w/Differential (85025-CBCD) TLB-Hepatic/Liver Function Pnl (80076-HEPATIC) TLB-TSH (Thyroid Stimulating Hormone) (84443-TSH) TLB-Uric Acid, Blood (84550-URIC) TLB-CK Total Only(Creatine Kinase/CPK) (82550-CK) TLB-A1C / Hgb A1C (Glycohemoglobin) (83036-A1C) TLB-Microalbumin/Creat Ratio, Urine (82043-MALB)  Labs Reviewed: SGOT: 17 (06/16/2009)   SGPT: 16 (06/16/2009)   HDL:25.00 (06/16/2009), 27.1 (10/17/2008)  LDL:DEL (10/17/2008)  Chol:242 (06/16/2009), 253 (10/17/2008)  Trig:258.0 (06/16/2009), 316 (10/17/2008)  Problem # 5:  DIABETES MELLITUS, TYPE II (ICD-250.00)  His updated medication list for this problem includes:    Januvia 100 Mg Tabs (Sitagliptin phosphate) .Marland Kitchen... 1 by mouth once daily    Lisinopril 10 Mg Tabs (Lisinopril) .Marland Kitchen... Take 1 by mouth once daily  Orders: Venipuncture (84696) TLB-Lipid Panel (80061-LIPID) TLB-BMP (Basic Metabolic Panel-BMET) (80048-METABOL) TLB-CBC Platelet - w/Differential (85025-CBCD) TLB-Hepatic/Liver Function Pnl (80076-HEPATIC) TLB-TSH (Thyroid Stimulating Hormone) (84443-TSH) TLB-Uric Acid, Blood (84550-URIC) TLB-CK Total Only(Creatine Kinase/CPK) (82550-CK) TLB-A1C / Hgb A1C (Glycohemoglobin) (83036-A1C) TLB-Microalbumin/Creat Ratio, Urine (82043-MALB)  Labs Reviewed: Creat: 0.9 (06/16/2009)    Reviewed HgBA1c results: 6.2 (06/16/2009)  7.4 (10/17/2008)  Complete Medication List: 1)  Freestyle Lite Strp (Glucose blood) .... Accu check tid 2)  Januvia 100 Mg Tabs (Sitagliptin phosphate) .Marland Kitchen.. 1 by mouth once daily 3)  Lisinopril 10 Mg Tabs (Lisinopril) .... Take 1 by mouth once daily 4)  Crestor 40 Mg Tabs (Rosuvastatin calcium) .Marland Kitchen.. 1 by mouth at bedtime 5)  Levitra 20 Mg Tabs (Vardenafil hcl) .... As directed  Patient Instructions: 1)   stop crestor for 2 weeks--- call us to let us know if muscle aches have improved 2)  Take tylenol arthritis for pain as needed ---as directed on bottle Prescriptions: LISINOPRIL 10 MG TABS (LISINOPRIL) Take 1 by mouth once daily  #30 x 5   Entered and Authorized by:   Loreen Freud DO   Signed by:   Loreen Freud DO on 03/30/2010   Method used:   Electronically to        [facility] (retail)       [address]       [place], [place]  [id number]       Ph: [phone]       Fax: [phone]   RxID:   [id number]
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
## 2010-10-26 NOTE — Progress Notes (Signed)
Summary: Diabetic Medications  Phone Note Outgoing Call Call back at [facility] Phone [phone]   Call placed by: Rea College, CMA,  May 20, 2010 3:59 PM Call placed to: Patient Reason for Call: Get patient information Summary of Call: I spoke with [name] yesterday and gave him instructions for his nuclear stress test on 06/10/10 with regards of medications.  He told be he was on diabetic meds., but there were none listed on his papers.  I checked EMR and Dr. Laury Axon listed [name], so I called the patient back and ask him about that med.  He said he would call his pharmacy and call me back.  He called back saying that it had been put on "hold" for some unknown reason, but that he was getting it filled today.  He has been off of his Januvia for about 1 month.     Appended Document: Diabetic Medications pt needed prior auth --- it was approved

## 2010-10-26 NOTE — Medication Information (Signed)
Summary: Approved Janumet / [employer]  Approved Janumet / [employer]   Imported By: Lennie Odor 04/21/2010 14:37:25  _____________________________________________________________________  External Attachment:    Type:   Image     Comment:   External Document

## 2010-10-26 NOTE — Medication Information (Signed)
Summary: Prior Authorization for Janumet/[employer]  Prior Authorization for Janumet/[employer]   Imported By: Lanelle Bal 04/14/2010 09:37:35  _____________________________________________________________________  External Attachment:    Type:   Image     Comment:   External Document

## 2010-10-26 NOTE — Progress Notes (Signed)
Summary: [facility]- Verbal order for DM supplies  Phone Note From Other Clinic Call back at [phone]   Caller: [facility] DM Testing Supplies Summary of Call: Message left on Dr.Lowne's assistants VM: Please call to give verbal order for DM testing supplies Ref # [id number]  I called [facility] and ok'd for DM supplies to be dispensed, testing 2x daily based on previous order for DM supplies./[facility]  March 24, 2010 2:46 PM

## 2010-10-26 NOTE — Progress Notes (Signed)
Summary: lab result  Phone Note Outgoing Call   Call placed by: [facility] CMA,  April 06, 2010 9:22 AM Details for Reason: Ideally your LDL (bad cholesterol) should be <_70___, your HDL (good cholesterol) should be >__40_ and your triglycerides should be< 150.  Diet and exercise will increase HDL and decrease the LDL and triglycerides. Read Dr. Danice Goltz book--Eat Drink and Be Healthy.  We will recheck labs in __3_ months. Secondary to joint pains and elevated cpk---stop crestor for 2 weeks.---recheck cpk 2 weeks start tricor 145mg  #30  1 by mouth once daily , 2 refills  Uric acid is high----start allopurinol 100mg  #30  1 by mouth once daily 2 refills---this is to help prevent gout  DM---not Alverda Skeans Alma Friendly to janumet 50/1000 #60  1 by mouth  two times a day ,  2refills    repeat labs in 3 months----250.00  272.4  bmp, hgba1c, lipid, hep,uric acid (dx gout)  Summary of Call: left message to call  office..............Marland KitchenFelecia Deloach CMA  April 06, 2010 9:23 AM   Follow-up for Phone Call        DISCUSS WITH PATIENT, letter mailed, rx sent to pharmacy............Marland KitchenFelecia Deloach CMA  April 07, 2010 12:39 PM     New/Updated Medications: JANUMET 50-1000 MG TABS (SITAGLIPTIN-METFORMIN HCL) Take 1 by mouth  two times a day TRICOR 145 MG TABS (FENOFIBRATE) Take 1 by mouth once daily ALLOPURINOL 100 MG TABS (ALLOPURINOL) Take 1 by mouth once daily to prevent gout Prescriptions: JANUMET 50-1000 MG TABS (SITAGLIPTIN-METFORMIN HCL) Take 1 by mouth  two times a day  #60 x 2   Entered by:   Jeremy Johann CMA   Authorized by:   Loreen Freud DO   Signed by:   Jeremy Johann CMA on 04/07/2010   Method used:   Faxed to ...       [facility] (retail)       [address]       [place], [place]  [id number]       Ph: [phone]       Fax: [phone]   RxID:   [id number] ALLOPURINOL 100 MG TABS (ALLOPURINOL) Take 1 by mouth once daily to prevent  gout  #30 x 2   Entered by:   Jeremy Johann CMA   Authorized by:   Loreen Freud DO   Signed by:   Jeremy Johann CMA on 04/07/2010   Method used:   Faxed to ...       [facility] (retail)       [address]       [place], [place]  [id number]       Ph: [phone]       Fax: [phone]   RxID:   [id number] TRICOR 145 MG TABS (FENOFIBRATE) Take 1 by mouth once daily  #30 x 2   Entered by:   Jeremy Johann CMA   Authorized by:   Loreen Freud DO   Signed by:   Jeremy Johann CMA on 04/07/2010   Method used:   Faxed to ...       [facility] [address] (retail)       [address]       [place], [place]  [id number]       Ph: [phone]       Fax: [phone]   RxID:   [id number]

## 2010-10-26 NOTE — Progress Notes (Signed)
Summary: [facility]  Phone Note From Pharmacy Call back at [phone]   Caller: [facility] Summary of Call: Message left on triage VM: Please call with update status on Vaccum device paperwork. Ref # [id number]   Shonna Chock  April 01, 2010 3:23 PM   left message to call  office.............Marland KitchenFelecia Deloach CMA  April 01, 2010 4:12 PM   Follow-up for Phone Call        per dr Laury Axon pt referred to urology. form faxed back indicated that............Marland KitchenFelecia Deloach CMA  April 02, 2010 10:48 AM

## 2010-10-26 NOTE — Letter (Signed)
Summary: Medical Report Form/[facility]  Medical Report Form/[facility]   Imported By: Lanelle Bal 08/05/2010 13:39:08  _____________________________________________________________________  External Attachment:    Type:   Image     Comment:   External Document

## 2010-10-26 NOTE — Letter (Signed)
[facility]: [facility]  [facility]   Imported By: Lanelle Bal 05/25/2010 09:28:43  _____________________________________________________________________  External Attachment:    Type:   Image     Comment:   External Document  Appended Document: [facility] please make sure labs were faxed to urology  Appended Document: [facility] labs faxed.

## 2010-10-26 NOTE — Medication Information (Signed)
Summary: [facility]  [facility]   Imported By: Lanelle Bal 04/12/2010 12:53:21  _____________________________________________________________________  External Attachment:    Type:   Image     Comment:   External Document

## 2010-10-26 NOTE — Letter (Signed)
Summary: CMN Request for Vacuum Erection System/[facility]  CMN Request for Vacuum Erection System/[facility]   Imported By: Lanelle Bal 04/09/2010 09:20:35  _____________________________________________________________________  External Attachment:    Type:   Image     Comment:   External Document

## 2010-10-26 NOTE — Consult Note (Signed)
Summary: Stacey Drain MD Rheumatology  Stacey Drain MD Rheumatology   Imported By: Lanelle Bal 05/05/2010 09:56:13  _____________________________________________________________________  External Attachment:    Type:   Image     Comment:   External Document
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
## 2010-10-26 NOTE — Progress Notes (Signed)
Summary: labs results  Phone Note Call from Patient Call back at Home Phone [phone]   Caller: Patient Summary of Call: Message left on Triage VM: Patient was returning call, I reviewed chart -? who called patient (no phone note on hold)    Chrae Malloy  April 01, 2010 2:35 PM   Follow-up for Phone Call        left message to call office. Marti Sleigh Deloach CMA  April 01, 2010 2:56 PM   -- ANA- elevated---refer to rheum  --low---take otc vita D3 2000u daily---recheck 3 months left message to call office.................Marland KitchenFelecia Deloach CMA  April 02, 2010 8:42 AM  DISCUSS WITH PATIENT............Marland KitchenFelecia Deloach CMA  April 05, 2010 11:57 AM

## 2010-10-26 NOTE — Letter (Signed)
Summary: [facility]   [facility]   Imported By: Roderic Ovens 05/25/2010 16:45:40  _____________________________________________________________________  External Attachment:    Type:   Image     Comment:   External Document

## 2010-10-26 NOTE — Miscellaneous (Signed)
Summary: Appointment Canceled  Appointment status changed to canceled by [employer] on 06/09/2010 2:50 PM.  Cancellation Comments --------------------- echo/780.2/humana/no prec req/stress @ 8:15/saf  Appointment Information ----------------------- Appt Type:  CARDIOLOGY ANCILLARY VISIT      Date:  Thursday, June 10, 2010      Time:  10:30 AM for 60 min   Urgency:  Routine   Made By:  Pearson Grippe  To Visit:  [id number]    Reason:  echo/780.2/humana/no prec req/stress @ 8:15/saf  Appt Comments ------------- -- 06/09/10 14:50: ([facility]) CANCELED -- echo/780.2/humana/no prec req/stress @ 8:15/saf -- 05/19/10 15:47: ([facility]) BOOKED -- Routine CARDIOLOGY ANCILLARY VISIT at 06/10/2010 10:30 AM for 60 min echo/780.2/humana/no prec req/stress @ 8:15/saf
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
## 2010-10-26 NOTE — Progress Notes (Signed)
Summary: Janumet PA  Phone Note Refill Request Call back at [phone] Message from:  Pharmacy on April 08, 2010 7:50 AM  Refills Requested: Medication #1:  JANUMET 50-1000 MG TABS Take 1 by mouth  two times a day   Dosage confirmed as above?Dosage Confirmed   Supply Requested: 1 month [facility] [address] PRIOR AUTH: [id number]  Next Appointment Scheduled: none Initial call taken by: Lavell Islam,  April 08, 2010 7:51 AM  Follow-up for Phone Call        Form requested. Lucious Groves CMA  April 08, 2010 9:26 AM   Form completed and faxed, will await insurance company reply. Lucious Groves CMA  April 08, 2010 11:21 AM      Appended Document: Janumet PA Called insurance company to check status and is approved until 2013.

## 2010-10-28 ENCOUNTER — Telehealth: Payer: Self-pay | Admitting: Family Medicine

## 2010-10-28 NOTE — Medication Information (Signed)
Summary: [facility]  [facility]   Imported By: Lanelle Bal 10/05/2010 12:48:00  _____________________________________________________________________  External Attachment:    Type:   Image     Comment:   External Document

## 2010-10-28 NOTE — Medication Information (Signed)
Summary: [facility]  [facility]   Imported By: Lanelle Bal 10/08/2010 13:48:05  _____________________________________________________________________  External Attachment:    Type:   Image     Comment:   External Document

## 2010-10-28 NOTE — Letter (Signed)
Summary: [facility]  [facility]   Imported By: Lanelle Bal 10/22/2010 10:23:14  _____________________________________________________________________  External Attachment:    Type:   Image     Comment:   External Document

## 2010-10-28 NOTE — Progress Notes (Signed)
Summary: cpap order--order sent to [facility]  Phone Note Call from Patient Call back at Home Phone [phone]   Caller: Patient Call For: clance Summary of Call: Needs an order sent to [facility] for his cpap machine. Initial call taken by: Darletta Moll,  October 20, 2010 9:03 AM  Follow-up for Phone Call        Spoke with pt.  He states that back in November when [facility] came out to bring him his new mask and do download, they advised that his machine was too old and his humidifier was not working so they said he needs order for a new machine.  Pls advise thanks. Follow-up by: Vernie Murders,  October 20, 2010 9:13 AM  Additional Follow-up for Phone Call Additional follow up Details #1::        will send an order to [facility] for this.  Let pt know. Additional Follow-up by: Barbaraann Share MD,  October 20, 2010 5:22 PM    Additional Follow-up for Phone Call Additional follow up Details #2::    atc pt to inform him order was sent. Was unable to leave vm due to "the subsciber i have reached is out of the area". Will try back later [facility]  October 20, 2010 5:26 PM   Pt is aware order has been sent to [facility] per Dr. Shelle Iron.Michel Bickers CMA  October 21, 2010 9:12 AM

## 2010-10-28 NOTE — Consult Note (Signed)
Summary: [facility]  [facility]   Imported By: Lanelle Bal 10/12/2010 10:58:25  _____________________________________________________________________  External Attachment:    Type:   Image     Comment:   External Document

## 2010-10-28 NOTE — Progress Notes (Signed)
Summary: [facility]   Imported By: Sherian Rein 09/06/2010 10:14:36  _____________________________________________________________________  External Attachment:    Type:   Image     Comment:   External Document

## 2010-11-03 NOTE — Progress Notes (Signed)
Summary: letter for [facility]mv  Phone Note Call from Patient Call back at Home Phone [phone]   Caller: Patient Summary of Call: patient wants dr Laury Axon to send another letter to [facility]mv stating he can drive - he said he was cleared by neuro & cardio Initial call taken by: Okey Regal Spring,  October 28, 2010 1:50 PM  Follow-up for Phone Call        please advise Follow-up by: Almeta Monas CMA Duncan Dull),  October 28, 2010 4:56 PM  Additional Follow-up for Phone Call Additional follow up Details #1::        only notes I have from Neuro says not to drive----I need notes from cardio and neuro Additional Follow-up by: Loreen Freud DO,  October 28, 2010 8:18 PM    Additional Follow-up for Phone Call Additional follow up Details #2::    I made pt aware and advised him to have the other offices fax [place] information stating he can operate a Motor Vehicle, I gave him the fax number to send to [place] and he voiced understanding....  Follow-up by: Almeta Monas CMA (AAMA),  October 29, 2010 8:20 AM

## 2010-11-08 ENCOUNTER — Ambulatory Visit: Payer: Self-pay | Admitting: Family Medicine

## 2010-11-11 ENCOUNTER — Telehealth (INDEPENDENT_AMBULATORY_CARE_PROVIDER_SITE_OTHER): Payer: Self-pay | Admitting: *Deleted

## 2010-11-17 NOTE — Progress Notes (Signed)
Summary: Results---  Phone Note Outgoing Call   Call placed by: Almeta Monas CMA Duncan Dull),  November 11, 2010 1:20 PM Call placed to: Patient Summary of Call: [facility] came back 08/23/10 and I have contacted patient several times but he had No Showed or cancelled appts to review.... I will mail a copy of the [facility] t the patient but he will need to come in to pick up his booklet. RX Crestor 20mg  was recommended by Dr.Lowne and has been sent along with the lab results for patient to start....... Both mailed Initial call taken by: Almeta Monas CMA (AAMA),  November 11, 2010 1:22 PM    New/Updated Medications: CRESTOR 20 MG TABS (ROSUVASTATIN CALCIUM) 1 by mouth at bedtime-recheck labs in 3 mos Prescriptions: CRESTOR 20 MG TABS (ROSUVASTATIN CALCIUM) 1 by mouth at bedtime-recheck labs in 3 mos  #30 x 2   Entered by:   Almeta Monas CMA (AAMA)   Authorized by:   Loreen Freud DO   Signed by:   Almeta Monas CMA (AAMA) on 11/11/2010   Method used:   Print then Give to Patient   RxID:   [id number]

## 2010-12-09 LAB — POCT I-STAT, CHEM 8
Hemoglobin: 16.3 g/dL (ref 13.0–17.0)
Sodium: 140 mEq/L (ref 135–145)
TCO2: 26 mmol/L (ref 0–100)

## 2010-12-27 ENCOUNTER — Other Ambulatory Visit: Payer: Self-pay | Admitting: Pulmonary Disease

## 2010-12-27 DIAGNOSIS — G4733 Obstructive sleep apnea (adult) (pediatric): Secondary | ICD-10-CM

## 2010-12-28 ENCOUNTER — Encounter: Payer: Self-pay | Admitting: Family Medicine

## 2011-01-03 LAB — POCT URINALYSIS DIP (DEVICE)
Bilirubin Urine: NEGATIVE
Glucose, UA: NEGATIVE mg/dL
Nitrite: NEGATIVE
Urobilinogen, UA: 0.2 mg/dL (ref 0.0–1.0)
pH: 5.5 (ref 5.0–8.0)

## 2011-02-08 NOTE — Assessment & Plan Note (Signed)
[facility]                             PULMONARY OFFICE NOTE   OZIAS, DICENZO                      MRN:          [id number]  DATE:03/29/2007                            DOB:          11-14-1970    SUBJECTIVE:  Mr. Loftin comes in today for followup of his obstructive  sleep apnea.  He has been wearing his CPAP most nights and is having no  difficulties with the mask or pressure.  He feels that he is sleeping  better on a more consistent basis, and has noticed increased alertness.  He is going to need surgical clearance for gastric bypass.   PHYSICAL EXAMINATION:  IN GENERAL:  He is an obese male in no acute  distress.  Blood pressure is 120/98, pulse is 72, temperature is 98.0, weight is  409 pounds.  O2 saturation on room air is 93%.  There is no evidence of skin breakdown or pressure necrosis from the  CPAP mask.   IMPRESSION:  Severe obstructive sleep apnea which is better with CPAP  therapy.  The patient is having no difficulty with mask or pressure.  At  this point in time he needs to have his pressure optimized, and will  arrange for an auto titrate device as a loaner for 2 weeks with  download.  From a surgical standpoint, he should have no difficulty with  his gastric surgery with respect to his sleep apnea.  I have asked him  to take his mask and head gear with him to his surgery, but hospital  policy dictates that he will have to use their machine.  I would be  happy to see him in the postoperative period if needed.   PLAN:  1. Auto titrate device for 2 weeks with download for pressure      optimization.  I will call the patient and let him know.  2. Work on weight loss.  3. He will follow up in 6 months or sooner if there are problems.     Barbaraann Share, MD,FCCP  Electronically Signed    [facility]/MedQ  DD: 03/29/2007  DT: 03/29/2007  Job #: [id number]   cc:   Lelon Perla, DO

## 2011-02-08 NOTE — Discharge Summary (Signed)
[name], [name]               ACCOUNT NO.:  [id number]   MEDICAL RECORD NO.:  [id number]          PATIENT TYPE:  IPS   LOCATION:  0507                          [facility]ILITY[facility]   PHYSICIAN:  Geoffery Lyons, M.D.      DATE OF BIRTH:  1971/05/07   DATE OF ADMISSION:  05/06/2007  DATE OF DISCHARGE:  05/08/2007                               DISCHARGE SUMMARY   CHIEF COMPLAINT AND PRESENT ILLNESS:  This was the second admission to  [facility] for this 40 year old married African-  American male voluntarily admitted.  History of intentional overdose.  Took Aricept, his medication, about 20 tablets.  Endorsed it was very  impulsive.  Felt he didn't need to be here.  Left a note for his wife  who was working nights and found him the next day and called EMS.  On  this evaluation though he was glad he was still alive.  He took himself  off all his medication nine months prior to this admission because it  was making him too sedated.  Off his medication, felt he was doing well.   PAST PSYCHIATRIC HISTORY:  Only admission two years prior to this  admission due to depression.  Had a history of overdosing at age 40.  Was also an outpatient at [facility].  Has been on Wellbutrin,  Aricept and Abilify.   ALCOHOL/DRUG HISTORY:  Claims that he drinks alcohol on rare occasions.  Denies any abuse.   MEDICAL HISTORY:  Hypertension, hypercholesterolemia, sleep apnea, on a  C-PAP machine.   MEDICATIONS:  Has been on Lipitor.   PHYSICAL EXAMINATION:  Performed and failed to show any acute findings.   LABORATORY DATA:  CMET within normal limits.  Alcohol level less than 5.  CBC within normal limits.  Urinalysis negative.  UDS was negative.   MENTAL STATUS EXAM:  Fully alert, cooperative male, fair eye contact.  Casually dressed, appropriately groomed.  Speech is clear, normal in  pace, tempo and production.  Mood endorsed he was feeling stupid for  what he did.  Affect was  polite, pleasant, broad.  Thought processes are  logical, coherent and relevant.  There is no evidence of delusions.  No  active suicidal or homicidal ideation.  No hallucinations.  Cognition  well-preserved.   ADMISSION DIAGNOSES:  AXIS I:  Major depressive disorder.  AXIS II:  No diagnosis.  AXIS III:  Hypertension, sleep apnea, elevated cholesterol.  AXIS IV:  Moderate.  AXIS V:  GAF upon admission 35; highest GAF in the last year 65-70.   HOSPITAL COURSE:  He was admitted.  He was started in individual and  group psychotherapy.  He was on Crestor but last seen in January and  lisinopril/hydrochlorothiazide on hold since June so he has not been  taking any medications.  He admitted that he felt he did something  stupid.  The wife told him she wanted to leave.  He got very upset and  took 20 of his pills.  Has two children, ages 74 and 69.  He is on  disability, working a  few hours in the post office.  Endorsed he did  really well for a long time.  Was able to come off his medications.  After the incident with his wife telling him she had found someone else,  he became very upset.  Has not been able to sleep, constantly thinking,  ruminating about what happened with the wife.   As already stated, Earnestine Leys in 2005.  No current  follow-up.  Initially depressed, tearful, upset but denied any suicidal  ideas.  Endorsed that he found out by being in the hospital that there  were people who really liked him and loved him.  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  We tried to schedule a session with his wife  to address some of their issues.  There was a family session with his  wife.  Wife endorsed that they had a lot of work to do between them once  he got home but apparently her perception of things was that he took a  whole lot of nothing and made it into something.  He expressed to her  how vulnerable he felt.  He also endorsed that he learned that   overdosing would not accomplish anything and, if his wife and he did  not make it, he would just have to learn how to move on.  On May 08, 2007, he was in full contact with reality.  Endorsing no suicidal or  homicidal ideation.  No hallucinations.  No delusions.  The wife was  going to be willing to allow him to stay in the house in separate rooms  and he was clear in that they might not get back together but he felt he  was ready to work on the relationship or, if it was not going to work  out, he was ready to let go.   DISCHARGE DIAGNOSES:  AXIS I:  Adjustment disorder with depressed mood.  AXIS II:  No diagnosis.  AXIS III:  Hypertension, sleep apnea, hypercholesterolemia.  AXIS IV:  Moderate.  AXIS V:  GAF upon discharge 55-60.   DISCHARGE MEDICATIONS:  None.   FOLLOWUP:  Going to pursue counseling through [facility].      Geoffery Lyons, M.D.  Electronically Signed     IL/MEDQ  D:  05/29/2007  T:  05/29/2007  Job:  [id number]

## 2011-02-08 NOTE — H&P (Signed)
[name], [name]               ACCOUNT NO.:  [id number]   MEDICAL RECORD NO.:  [id number]          PATIENT TYPE:  IPS   LOCATION:  0507                          [facility]ACILITY[facility]  [facility]   PHYSICIAN:  [name], M.D.      DATE OF BIRTH:  January 07, 1971   DATE OF ADMISSION:  05/06/2007  DATE OF DISCHARGE:  05/08/2007                       PSYCHIATRIC ADMISSION ASSESSMENT   IDENTIFYING INFORMATION:  This is a 41 year old married African-American  male voluntarily admitted on May 06, 2007.   HISTORY OF PRESENT ILLNESS:  The patient presents with a history of  intentional overdose.  The patient reports that he took Aricept, his  medications, about 20 tablets on Saturday.  States it was very  impulsive.  His intention was that he didn't think he needed to be  here.  He had left a note for his wife.  Has wife was working nights  and found him the next day and had called EMS.  Per chart, medication  identified as an overdose was Risperdal but the patient believes that he  overdosed on Aricept.  Today, he is glad he is still here.  He states  he took himself off all of his medication about nine months ago because  it was making him sedated and he states, after he was off his  medications, he felt he was doing well.  He had gotten himself a job.  He denies any hallucinations.  Denies any substance abuse.   PAST PSYCHIATRIC HISTORY:  The patient was here about two years ago for  depressive symptoms.  Has a history of overdosing in the past at the age  of 71.  He was hospitalized at that time.  Was an outpatient at [facility].  In the past, has been on Wellbutrin, Aricept and  Abilify.   SOCIAL HISTORY:  This is a 40 year old African-American male.  Married  for two years.  This is his second marriage.  He has three children.  He  lives with his wife and two youngest child and no legal problems.   FAMILY HISTORY:  Mother on depressive medication.   ALCOHOL/DRUG HISTORY:  The  patient smokes.  Drinks alcohol on rare  occasions.  Denies any drug use.   PRIMARY CARE PHYSICIAN:  Primary care [name] is Dr. Deatra [name] in [place]  on [facility] at the [facility].   MEDICAL PROBLEMS:  Hypertension, hypercholesteremia, sleep apnea, is on  the C-PAP machine.   MEDICATIONS:  Has been on Lipitor for his cholesterol.  Again in the  past has been on Wellbutrin, Aricept and Abilify.   ALLERGIES:  ASPIRIN (he states he gets a nosebleed).   PHYSICAL EXAMINATION:  The patient was assessed at [facility].  This is a morbidly obese male with no acute distress.  His temperature  is 99.2, heart rate 87, respirations 20, blood pressure 138/92.   LABORATORY DATA:  Urine drug screen was negative.  CMET is within normal  limits.  Alcohol level less than 5.  CBC within normal limits.  Urinalysis is negative.  This is a fully alert, cooperative  male.  Fair  eye contact.  He is casually dressed.  His speech is clear, normal pace  and tone.  The patient's mood, he feels stupid today.  Affect is  polite, pleasant.  Thought processes are coherent.  There is no evidence  of any thought disorder.  Cognitive function intact.  Memory is good.  Judgment is fair.  Insight is fair.   DIAGNOSES:  AXIS I:  Depressive disorder not otherwise specified.  AXIS II:  Deferred.  AXIS III:  Hypertension, sleep apnea and elevated cholesterol.  AXIS IV:  Psychosocial problems, possible medical problems.  AXIS V:  Current 35.   PLAN:  Contract for safety.  Stabilize mood and thinking.  Will check  his pulse ox.  Will obtain a C-PAP machine for the patient.  Will have a  family session with wife.  Will consider an antidepressant.  The patient  will follow up with mental health.   TENTATIVE LENGTH OF STAY:  Three to five days.      [name], N.P.      [name], M.D.  Electronically Signed    JO/MEDQ  D:  05/08/2007  T:  05/09/2007  Job:  [id number]

## 2011-02-11 NOTE — Assessment & Plan Note (Signed)
[facility]                             PULMONARY OFFICE NOTE   CARTHEL, CASTILLE                      MRN:          [id number]  DATE:08/31/2006                            DOB:          09-Jun-1971    REFERRING PHYSICIAN:  Loreen Freud, M.D.   SLEEP MEDICINE CONSULTATION:   HISTORY OF PRESENT ILLNESS:  Patient is a 40 year old male who I have  been asked to see for obstructive sleep apnea.  The patient underwent  nocturnal polysomnography on July 20, 2006, whereby a split-night  protocol, he was found to have severe obstructive sleep apnea with a  Respiratory Disturbance Index of 65 events per hour and O2 desaturations  as low as 87%.  He was then placed on CPAP and titrated to 9 cm with  what appeared to be excellent tolerance and control.  Patient states  that he has been told he has loud snoring and pauses in his breathing  during sleep, and he has frequent awakenings with choking arousals.  He  typically gets to bed at 10 and 11 and gets up at 6:20 to start his day.  He has not rested upon arising.  Patient currently on disability and  states that he will fall asleep almost anytime he sits down.  He does  have sleepiness with driving.  Of note, his weight has been neutral over  the last two years.   PAST MEDICAL HISTORY:  1. Hypertension.  2. Dyslipidemia.  3. History of depression.  4. History of bipolar illness.  5. History of schizophrenia.   CURRENT MEDICATIONS:  1. Lisinopril/hydrochlorothiazide 10/12.5 daily.  2. Crestor 10 mg daily.   Patient has no known drug allergies.   SOCIAL HISTORY:  He is married and has children.  He has a history of  smoking one pack per day for seven years.  He has not smoked since  October, 2007.   FAMILY HISTORY:  Remarkable his father having asthma, brother having  heart disease, otherwise noncontributory.   REVIEW OF SYSTEMS:  As per the history of present illness, also see  patient  intake form documented on the chart.   PHYSICAL EXAMINATION:  GENERAL:  He is a morbidly obese male in no acute  distress.  VITAL SIGNS:  Blood pressure 116/84, pulse 71, temperature 98.2.  Weight  is 388 pounds.  O2 saturation on room air is 97%.  HEENT:  Pupils are equal, round and reactive to light and accommodation.  Extraocular movements are intact.  Nares show deviated septum to the  left with significant narrowing.  Oropharynx shows significant  elongation of the uvula and palate with side wall narrowing.  NECK:  Supple without JVD or lymphadenopathy.  There is no palpable  thyromegaly.  CHEST:  Totally clear.  HEART:  Regular rate and rhythm with a 2/6 systolic murmur.  ABDOMEN:  Soft, nontender.  Good bowel sounds.  GENITOURINARY/RECTAL/BREASTS:  Not done, not indicated.  LOWER EXTREMITIES:  Trace edema.  Pulses are intact distally.  NEUROLOGIC:  Alert and oriented with no obvious motor deficits.   IMPRESSION:  Severe obstructive  sleep apnea documented by nocturnal  polysomnography.  Patient is very symptomatic and morbidly obese.  I  think his only treatment option at this time is weight loss coupled with  CPAP.  Patient is agreeable to this approach.   PLAN:  1. Initiate CPAP at 10 cm.  2. Work on weight loss.  3. Patient will follow up in four weeks or sooner if there are      problems.  4. I have reminded the patient of his moral responsibilities to not      drive if he is sleepy.     Barbaraann Share, MD,FCCP  Electronically Signed    [facility]/MedQ  DD: 10/12/2006  DT: 10/12/2006  Job #: [id number]   cc:   Loreen Freud, M.D.

## 2011-02-11 NOTE — Procedures (Signed)
NAMECARY, [name]               ACCOUNT NO.:  [id number]   MEDICAL RECORD NO.:  [id number]          PATIENT TYPE:  OUT   LOCATION:  SLEEP CENTER                 [facility]ACILITY[facility]  [facility]   PHYSICIAN:  Barbaraann Share, MD,FCCPDATE OF BIRTH:  03/07/1971   DATE OF STUDY:  07/20/2006                              NOCTURNAL POLYSOMNOGRAM   INDICATION FOR STUDY:  Hypersomnia, with sleep apnea.   EPWORTH SLEEPINESS SCORE:  20.   MEDICATIONS:   SLEEP ARCHITECTURE:  The patient had a total sleep time of 369 minutes, with  very large amounts of REM in the amount of 178 minutes, however, never  achieved slow-wave sleep.  Sleep onset latency was normal, as was REM onset.  Sleep efficiency was decreased at 88%.   RESPIRATORY DATA:  The patient underwent split-night protocol, where they  were noted to have 143 obstructive events in the first 132 minutes of sleep.  This gave the patient a respiratory disturbance index of 65 events per hour  during the first half of the night.  The events were not positional, but  there was mild snoring noted throughout.  By protocol, the patient was then  placed on a large ResMed Quattro CPAP mask and ultimately titrated to a  final pressure of 9 cm in order to relieve both obstructive events and  snoring.  The patient had significant REM rebound associated with the  initiation of CPAP, and had excellent control of the events even through  REM.   OXYGEN DATA:  His O2 saturations were as low as 87% with the obstructive  events.   CARDIAC DATA:  No clinically significant cardiac arrhythmias.   MOVEMENT-PARASOMNIA:  The patient was found to have 49 leg jerks, with no  significant sleep disruption.   IMPRESSIONS-RECOMMENDATIONS:  Split-night study reveals severe obstructive  sleep apnea, with a respiratory disturbance index of 65 events per hour and  O2 desaturation as low at 87% during the first half of the night.  The  patient was then placed on CPAP with a large  ResMed Quattro mask and  ultimately titrated to 9 cm, with significant REM rebound documented.           ______________________________  Barbaraann Share, MD,FCCP  Diplomate, American Board of Sleep  Medicine    [facility]/MEDQ  D:  07/27/2006 16:12:06  T:  07/28/2006 07:26:16  Job:  [id number]

## 2011-02-11 NOTE — Op Note (Signed)
NAMESACHIN, FERENCZ               ACCOUNT NO.:  [id number]   MEDICAL RECORD NO.:  [id number]          PATIENT TYPE:  AMB   LOCATION:  DSC                          FACILITY:  [facility]   PHYSICIAN:  Christopher E. Ezzard Standing, M.D.DATE OF BIRTH:  [birth year]   DATE OF PROCEDURE:  12/05/2006  DATE OF DISCHARGE:  12/05/2006                               OPERATIVE REPORT   PREOPERATIVE DIAGNOSIS:  Posterior occipital keloid, 2.5 cm wide x 5.5  cm long.   POSTOPERATIVE DIAGNOSIS:  Posterior occipital keloid.   OPERATION:  Excision of posterior occipital keloid.   SURGEON:  Kristine Garbe. Ezzard Standing, M.D.   ANESTHESIA:  Local 1% Xylocaine with epinephrine.   COMPLICATIONS:  None.   BRIEF CLINICAL NOTE:  Octaviano Mukai is a 40 year old gentleman, who has  had a posterior occipital keloid for a number of years.  He previously  tried steroid injections without much benefit.  On examination, he had a  large, approximately a 2.5 to 3-cm x 6-cm keloid in the occipital area,  high posterior neck region.  He is taken to the operating room at this  time for excision of keloid and planned postoperative radiation therapy.   DESCRIPTION OF PROCEDURE:  The patient previously shaved the hair around  the keloid.  The neck and occipital area were then prepped with Betadine  solution and draped out with sterile towels.  Then, using 12 to 14 mL of  Xylocaine with epinephrine, the area was injected for local anesthetic.  Using a 15 blade, the keloid was totally excised.  Hemostasis was  obtained with cautery.  A small subcutaneous flap was elevated  inferiorly so as to allow adequate closure.  The wound was then closed  with 3-0 Vicryl sutures subcutaneously and a 5-0 nylon subcuticular  stitch to reapproximate the skin edges.  Steri-Strips and a dressing  were applied.  The patient tolerated the procedure well and was  subsequently discharged home.   DISPOSITION:  The Patient Is discharged home on Keflex 500 mg  t.i.d. for  5 days, Tylenol and Vicodin p.r.n. pain.  He has a scheduled appointment  to receive postoperative radiation therapy this afternoon with the  radiation therapist to help deter the recurrence of keloid.           ______________________________  Kristine Garbe. Ezzard Standing, M.D.     CEN/MEDQ  D:  12/05/2006  T:  12/07/2006  Job:  [id number]   cc:   Lelon Perla, DO

## 2011-02-11 NOTE — Discharge Summary (Signed)
NAME:  [name], [name]                         ACCOUNT NO.:  [id number]   MEDICAL RECORD NO.:  [id number]                   PATIENT TYPE:  INP   LOCATION:  0375                                 FACILITY:  [facility]   PHYSICIAN:  Elliot Cousin, M.D.                 DATE OF BIRTH:  10-Mar-1971   DATE OF ADMISSION:  06/09/2004  DATE OF DISCHARGE:  06/10/2004                                 DISCHARGE SUMMARY   DISCHARGE DIAGNOSIS:  1.  Chest pain, myocardial infarction ruled out.  2.  Hyperlipidemia.  3.  Tobacco abuse.   SECONDARY DIAGNOSIS:  1.  Morbid obesity.  2.  Schizophrenia.  3.  Question history of lupus.  4.  History of Bell's palsy.  5.  History of kidney stones.   DISCHARGE MEDICATIONS:  1.  Aspirin 81 mg daily.  2.  Lipitor 20 mg q.h.s.  3.  Protonix 40 mg daily.  4.  Neurontin 1200 mg q.h.s.  5.  Wellbutrin 300 mg daily.  6.  Trazodone 100 to 200 mg q.h.s.  7.  Risperdal 1 mg b.i.d.  8.  Aricept 10 mg q.h.s.   DISCHARGE DISPOSITION:  The patient was discharged to home on June 10, 2004, in improved and stable condition.  He has an outpatient stress test  scheduled with [facility] on Tuesday, September 20 at 12 noon.  The  patient was advised to follow up with the outpatient clinic in 3-4 weeks for  management of hyperlipidemia and obesity.  He was advised to follow up with  mental health as previously scheduled.   HISTORY OF PRESENT ILLNESS:  The patient is a 40 year old man with a past  medical history significant for tobacco use and obesity who presented to the  emergency department on June 09, 2004, with the onset of substernal  chest pain.  The patient stated that he woke up on the morning of admission  with a sharp, retrosternal chest pain that he graded as an 8 to 10/10 in  intensity.  There was no radiation and no diaphoresis.  The pain was  subsided with one sublingual nitroglycerin.  The patient developed chest  pain last Thanksgiving and  was in the hospital in [place].  His  workup was apparently negative and he was discharged to home.  During this  presentation, the patient had sharp chest pain but no pleurisy.  It seemed  to worsen with movement.  It was not relieved by rest.  He denied any  shortness of breath, cough, fever, or chills.   HOSPITAL COURSE:  The patient was given 1 sublingual nitroglycerin which  appeared to have eased the pain in the emergency department.  Cardiac  markers were ordered in the emergency department and were negative.  Subsequently, cardiac enzymes q.8h. x 3 were ordered.  The cardiac enzymes  were negative x 3 sets, although the patient's CK was elevated ranging  between 540 and 882.  The relative indices were all negative.  The patient's  troponin I x 3 was 0.01.  The patient's EKG on admission revealed normal  sinus rhythm with a heart rate of 69 and nonspecific ST wave abnormalities.  There were no ST elevations significantly and no ST depressions  significantly.  The patient was monitored on telemetry overnight.  He did  have several episodes of bradycardia with his heart rate dropping down into  the 35 to 50 range.  However, this was only during sleep.  While he was  awake, his pulse rate ranged between 62 and 72.  The patient's TSH was  assessed and was found to be within normal limits at 4.303.  His fasting  lipid panel was assessed and revealed a total cholesterol of 225,  triglycerides 246, HDL cholesterol 26, LDL 150.  The patient was, therefore,  started on Lipitor 20 mg q.h.s.  The patient was initially treated with  Plavix 75 mg daily and sublingual nitroglycerin as needed.  He was also  started on empiric treatment with Protonix 40 mg daily.  Following hospital  admission, the patient had no further complaints of chest pain.  The Plavix  was discontinued and the patient was started on an aspirin 81 mg daily until  the outpatient stress test.  [facility] was  contacted for scheduling  of the outpatient stress test for the patient.  This has been arranged for  Tuesday, September 20, at 12 noon.  It is also important to note that the  patient does smoke.  He was advised to stop smoking.  He is also morbidly  obese.  The patient was advised to follow up with the physicians in  outpatient clinic for assistance with weight loss.      DF/MEDQ  D:  06/14/2004  T:  06/14/2004  Job:  [id number]   cc:   [facility]

## 2011-02-11 NOTE — Consult Note (Signed)
[facility]  Patient:    WINDSOR, GOEKEN Visit Number: [id number] MRN: [id number]          Service Type: EMS Location: ED Attending Physician:  Tobey Bride Dictated by:   Deanna Artis. Sharene Skeans, M.D. Proc. Date: 02/24/02 Admit Date:  02/24/2002   CC:         Marlan Palau, M.D.   Consultation Report  DATE OF BIRTH:  [birth year]  HISTORY OF PRESENT ILLNESS:  The patient is a 40 year old right-handed African American gentleman who had onset of Bells palsy, December 08, 2000.  The patient had significant facial weakness on the left side, alteration in his hearing, alteration in taste in his mouth.  He was evaluated in the emergency room at [facility].  The evaluation showed evidence of a Bells palsy.  CT scan of the brain was negative.  The patient was treated with prednisone 5 mg, six-day Dosepak, and Valtrex 500 mg twice daily.  He was seen on December 19, 2000 with complaints of worsening weakness in the left face, severe constant pain localized to the left jaw and behind his left ear.  It was triggered by movement and cold and then an occipital headache. The patient continued to have problems with hearing, stating that there was an echoing sound in his left ear, tearing of his left eye.  There was disequilibrium and falling, blurring of the vision of the left eye and pain when talking and swallowing.  The patient had been given hydrocodone as well as Celebrex, in addition to his other treatments.  His examination showed evidence of a left peripheral 7th, no alteration in sensation, however, he was able to close his left eye.  He had no other focal neurologic deficits.  He was treated with Famvir and Neurontin was started.  This had to be discontinued because he developed gynecomastia and some sun sensitivity.  MRI scan of the brain, June 27, 2001, was normal, with and without contrast, and showed no evidence of abnormality  either in the craniocervical junction or within the pons.  CT scan of the neck with contrast on August 20, 2001 showed increased density in the right parotid gland compared with the left, prominent nasopharyngeal soft tissue, prominent submandibular glands, extensive adenopathy, but no signs of a discrete mass.  Differential diagnosis of the enlarged right parotid included sarcoidosis, infiltrative disorder such as lymphoma.  Other possibilities included infectious or autoimmune processes.  The patient has continued to have bouts of lancinating pain that emanates from the left mastoid process around the occipital region.  On June 25, 2001, he complains of having shooting pains in the V1 and V2 distributions of his face on the left, increased pain with chewing and talking, blurring of vision. His examination showed tenderness to light touch in the V2 distribution on the left, left facial weakness and no other focal findings.  Possibility of trigeminal neuralgia was entertained.  It was at that point that an MRI scan was ordered, followed by the CT scan.  The patient was treated with prednisone which did not improve his current symptomatology.  He was then evaluated with a sedimentation rate, thyroid profile, ANA, rheumatoid factor, angiotensin-converting enzyme level, comprehensive metabolic panel and a CBC. These studies were normal other than an ANA, which was positive speckled titer 1:640, and a glucose that was borderline at 115, sample drawn at 9 oclock; I do not know if it was a fasting study.  The patient was  seen in December by an ENT physician who concluded that the hyperplastic salivary and cervical lymph nodes and adenoidal tissue were suspicious for HIV.  HIV titer was obtained by them and was negative.  The patient was treated with Zanaflex, which did not help for more than a short time.  He continued to have pain localized in the left jaw area, shooting into the  left occipital region, associated with neck stiffness, difficulty chewing and pain with swallowing.  Dose of Zanaflex was 2 mg q.i.d. His examination was unchanged other than decreased sensation in his left face and scalp in the V1 distribution.  Plans were made to repeat MRI scan and carry out further laboratory workup.  He was also given Voltaren for his pain, Zanaflex was discontinued and plans were made to set up a lumbar puncture.  The MRI scan on December 04, 2001 showed no evidence of acute ischemic changes, no mass effect or enhancing mass in the cerebellopontine angle, persistence of reactive lymphoid hyperplasia, mild pansinusitis changes and a hyperintense T1 image, as it was thought to be a hemangioma in the parietal region posteriorly which is of no consequence to this.  The patient also had lumbar puncture that was fluoroscopically guided that showed normal opening pressure and no evidence of abnormality in CSF IgG production or oligoclonal bands, Lyme titer that was negative and CSF angiotensin-converting enzyme that was negative and VDRL that was negative.  Unfortunately, these results were never shared with the patient and he did not call.  His last visit to our office was December 24, 2001, again with similar complaints, the pain which flared and decreased over three to four days in similar distribution, extending from the left mastoid process to the back of his head.  Examination again showed left facial weakness and altered sensation in the V1 distribution.  The patient was started on Carbatrol 200 mg daily for one week and then 200 mg twice a day.  He has failed to keep appointments on April 30th, May 15th, December 18, 2001, October 21st and January 11, 2001.  The patient called this morning around 4 a.m. stating that he had had one hour of excruciating pain beginning in the left occipital region, extending around the back of his head, affecting his neck, tightening his jaw to  the point where he felt that it was difficult to open and close his mouth.  He states  that his pain has recently been relatively brief in duration, lasting minutes at a time, and that this was the longest, most intense pain that he had had in quite some time.  His medications included diclofenac, which had been prescribed by Dr. Marlan Palau, and hydrocodone, which had been prescribed years ago, which had run out.  I directed him to the emergency room, where he was seen by Dr. Smitty Cords. Cheek.  I then saw him after reviewing the chart in detail.  PAST MEDICAL HISTORY:  Remarkable for lumbosacral scoliosis, followed by Dr. Javier Docker, idiopathic proteinuria, followed by Dr. Fayrene Fearing L. Deterding, Neurontin-induced gynecomastia, hypercholesterolemia, morbid obesity.  CURRENT MEDICATIONS:  His current medications only include diclofenac 75 mg twice a day and p.r.n. hydrocodone, which has run out.  Other medication that have been tried for this include prednisone, Zanaflex, Carbatrol in low doses and Neurontin.  ALLERGIES TO MEDICINES:  None known.  Intolerances:  ASPIRIN induced nose bleeds.  NEURONTIN induced gynecomastia.  REVIEW OF SYSTEMS:  Review of systems is negative for fevers, chills,  productive cough, respiratory distress, palpitations, nausea, vomiting, diarrhea, back pain, difficulty with balance, difficulty with gait.  The patient does complain of blurred vision in his eye, pain and tenderness at the angle of his jaw and in the mastoid region and inability to sleep tonight because of his pain.  SOCIAL HISTORY:  The patient is a Emergency planning/management officer, working for [employer].  He smokes cigarettes rarely.  He does not drink alcohol.  He is single. He has two children who are alive and well.  FAMILY HISTORY:  Mother -- hypertension with two strokes, father -- degenerative arthritis, a brother who died of a myocardial infarction at age 83, a brother who is alive  and well, a sister with lupus and chronic renal insufficiency, on peritoneal dialysis.  There is also a family history of diabetes and thyroid disorder.  PHYSICAL EXAMINATION:  GENERAL:  Morbidly obese gentleman, pleasant, who is sleeping, having been given a shot of Demerol 100 mg, Vistaril 50 mg about 40 minutes before.  VITAL SIGNS:  Blood pressure 133/73.  Resting pulse 72.  Respirations 22. Temperature 97.2.  Pulse oximetry 96%.  HEENT:  No signs of infection.  NECK:  Supple neck with full range of motion, although he had tenderness when the neck on the left side was stretched or touched.  LUNGS:  Clear to auscultation.  HEART:  No murmurs.  Pulses normal.  ABDOMEN:  Protuberant.  Bowel sounds normal.  No masses palpated.  EXTREMITIES:  Extremities are well-formed without edema, cyanosis or alterations in tone.  NEUROLOGIC:  Mental status:  The patient was awake, somewhat sleepy and fully oriented without dysphasia or dyspraxia.  Cranial nerves:  Round, reactive pupils.  Normal fundi.  Full visual fields to double simultaneous stimuli.  He complained of blurred vision in his left eye.  I did not check visual acuity. He did not have an afferent pupillary defect or any other abnormality in the eye that I could discern.  He has evidence of facial weakness involving the mouth, the eyelid and the frontalis on the left.  He also had altered sensation that seemed to involve both V1 and V2 as well as the upper cervical C2 region.  He seemed to have preservation of V3 and C3.  He was able to protrude his tongue and elevate his uvula in midline.  Air conduction was greater than bone conduction bilaterally.  Motor examination:  Normal strength, tone and mass; good fine motor movements; no pronator drift. Sensation, except for his face, intact to cold, vibration and stereoagnosis. Cerebellar examination:  Good finger-to-nose, rapid repetitive movements and gait was not tested  because he was recently given narcotics and antiemetic. Deep tendon reflexes were symmetric but diminished.  The patient had bilateral flexor plantar responses.  IMPRESSION: 1. Remote effects of Bells palsy, 351.0. 2. Left pain that involves principally the upper cervical dermatome region,    including the angle of his jaw, mastoid region and the back of his head.  MEDICAL DECISION-MAKING:  It certainly would appear that at the same time that he developed inflammation of his facial nerve, that there was some coincident process involving possibly the trigeminal branches of his upper cervical nerves.  This seems to emanate and to behave very much like a tic douloureux but unfortunately has not responded well to medications for neuritis.  In certain cases, he has not given the drug a fair trial, such as Carbatrol. Other cases with Neurontin, he has had unfortunate side-effects that led  to it being discontinued.  Anti-inflammatory medication, diclofenac, has worked as well for him as anything, according to the patient, and should be continued. We need to consider use of other medications for neuritis which could include return to Tegretol products, Trileptal, Topamax, or Keppra.  I spoke with the patient and told him that it was very important for him to keep appointments that were made, to contact the office when he had questions and to deal with the office during office hours rather than on an episodic basis through the emergency room.  I acknowledged to him that I understood that the pain was very severe and that we were happy to help him with his problem tonight.  I told him that we would not prescribe narcotic analgesics and that he should continue to take the diclofenac and contact Demetrio Lapping, our physicians assistant, for a return appointment in our office on a day when Dr. Anne Hahn could be there. Dictated by:   Deanna Artis. Sharene Skeans, M.D. Attending Physician:  Tobey Bride DD:  02/24/02 TD:  02/26/02 Job: [id number]

## 2011-02-11 NOTE — H&P (Signed)
NAME:  Justin, [name]                         ACCOUNT NO.:  [id number]   MEDICAL RECORD NO.:  [id number]                   PATIENT TYPE:  INP   LOCATION:  0101                                 FACILITY:  [facility]   PHYSICIAN:  Lonia Blood, M.D.                   DATE OF BIRTH:  [birth year]   DATE OF ADMISSION:  06/09/2004  DATE OF DISCHARGE:                                HISTORY & PHYSICAL   PRIMARY CARE PHYSICIAN:  Unassigned.   CHIEF COMPLAINT:  Chest pain.   HISTORY OF PRESENT ILLNESS:  This is a 40 year old morbidly obese man who  presented with substernal chest pain.  The patient woke up early this  morning with a sharp retrosternal chest pain that he graded as 8-10/10.  It  was located centrally with no radiation.  He denies any diaphoresis.  The  pain was persistent until he came to the ED and was relieved only after he  got some nitroglycerin.  The patient has developed chest pain last  Thanksgiving, and was in the hospital over in [place].  He had a  workup done overnight and was released.  The pain was described as sharp but  not pleuritic. It is worsened by movement.  It is not relieved by rest.  The  patient has been fairly active and denies any aching or pain in his  extremities.  He denied any shortness of breath.  He denied any fever or  cough.   PAST MEDICAL HISTORY:  Bell's palsy, questionable lupus diagnosis, kidney  stones, obesity, psychiatric illnesses, depression.   MEDICATIONS:  The patient is taking Neurontin, trazodone, Risperdal and  Wellbutrin.  He is does not know the doses.  His fiance is going to bring  the medicine later today.   ALLERGIES:  The patient is allergic to ASPIRIN.  According to patient, it  causes some bleeding.   SOCIAL HISTORY:  The patient lives with his fiancee here in [place].  He  smokes about 1/2 to 1 pack per day.  Occasional alcohol.  The patient is  unemployed currently.   FAMILY HISTORY:  Significant for coronary  artery disease and hypertension.  His mother had myocardial infarction at an early age.  His brother died of  myocardial infarction in his 47's.   REVIEW OF SYSTEMS:  GENERAL:  He denied any fever, weight loss or gain.  CHEST:  Denied any cough or shortness of breath.  CARDIOVASCULAR:  Denied  any PND, orthopnea or shortness of breath.  ABDOMEN: Denied any abdominal  pain, nausea, vomiting, constipation or diarrhea.  EXTREMITIES:  Denies any  swelling, weakness or joint pain.   PHYSICAL EXAMINATION:  VITAL SIGNS:  Temperature 98.5, blood pressure 167/98  on admission, and down to 133/87.  Pulse 70.  Respiratory rate 18.  Saturations 93% on room air.  GENERAL:  The patient is alert and oriented, in no acute  distress.  He is  morbidly obese.  NECK:  Supple.  No JVD.  Positive keloid posteriorly.  CHEST:  Clear to auscultation bilaterally.  No rales or rubs.  CARDIOVASCULAR SYSTEM:  Heart sounds 1 and 2, with no murmurs.  ABDOMEN:  Obese, nontender.  Positive bowel sounds.  EXTREMITIES:  No edema, cyanosis or clubbing.   LABORATORY DATA:  White count 7.8, hemoglobin 12.8, platelets 285,000.  Sodium 140.  Potassium 3.9.  Chloride 106.  CO2 of 27.  BUN 8.  Creatinine  1.0.  Glucose 107.  LFT's are essentially negative.  Initial cardiac enzymes  were also negative.   The patient's EKG showed nonspecific with a rate of 69 beats per minute.  Normal intervals. No ST-depression or T wave inversion is appreciated.   Chest x-ray showed cardiomegaly but no edema with no active cardiopulmonary  disease.   ASSESSMENT:  This is a 40 year old African American man who is morbidly  obese, with risk factors for cardiac disease, including male sex, obvious  hypertension, family history of coronary artery disease at young age  strongly, and morbid obesity.  In addition, the patient is not aware of his  cholesterol level.  He is presenting with chest pain which seems to be  unprovoked.  Differentials  include acute coronary syndrome, but could also  be GI related or musculoskeletal.  The patient also could have had a PE,  although clinical suspicion is extremely low.   PLAN:  1.  Admit the patient for 23 hour observation to rule out myocardial      infarction.  Also check fasting lipid panel.  Check TSH.  Check a D-      dimer.  We will initiate nitroglycerin sublingually p.r.n., use Plavix      as needed daily for now, since the patient is allergic to aspirin.  The      patient has already been loaded with 300 mg of Plavix.  We will also      treat with Protonix 40 mg daily for possible GI causes.  Pain control      will also be considered with some morphine and oxygen also.  If the      patient rules out for myocardial infarction, I would strongly suggest      the patient should have some form of Cardiolite or stress test,      especially where this is second episode.  2.  Tobacco abuse.  The patient has been counseled extensively regarding his      tobacco use, especially with his recurrent chest pain and his strong      risk factors for cardiac disease.  3.  Psyche issues. The patient's fiancee will bring his medications, and we      will restart his home medicines as needed.                                                Lonia Blood, M.D.    Verlin Grills  D:  06/09/2004  T:  06/10/2004  Job:  [id number]

## 2011-02-11 NOTE — Consult Note (Signed)
[facility]  Patient:    [name], [name]                      MRN: [id number] Proc. Date: 12/08/00 Adm. Date:  [id number] Attending:  Ilene Qua CC:         Javier Docker, M.D.  [facility]  [facility]   Consultation Report  HISTORY OF PRESENT ILLNESS:  [name] is a 40 year old right-handed black gentleman born [birth year] with a history of obesity, low back pain currently followed by Dr. Paula Libra for this.  Patient has recently undergone an MRI scan of the lumbosacral spine but this morning awoke with some problems with left facial weakness, pain behind the left ear.  Patient has also noted alteration in hearing, feeling that the left ear is hearing "echoes."  The right ear is normal.  Patient has also had taste alteration in the mouth and not able to localize it one side to the next.  Patient became quite concerned, felt that the left hand was tingling a little bit, thought he might be having a stroke.  Came to the emergency room for evaluation.  A CT scan of the head was performed and was unremarkable.  Patient did note left facial pain beginning yesterday, but no weakness until this morning.  Patient is seen by neurology for further evaluation.  PAST MEDICAL HISTORY: 1. History of low back pain. 2. History of obesity. 3. New onset left Bells palsy. 4. History of "spot on the kidney."  Patient is being evaluated for this.    Cause unknown.  PAST SURGICAL HISTORY:  None.  CURRENT MEDICATIONS: 1. Celebrex 200 mg q.d. 2. Oxycodone for his back.  ALLERGIES:  ASPIRIN which causes nose bleeds.  SOCIAL HISTORY:  Smokes cigarettes on occasion.  Does not drink alcohol. Patient lives in the [place] area.  Works as a Emergency planning/management officer.  Is single. Has two children who are alive and well.  FAMILY HISTORY:  Notable in that mother is alive with hypertension, two strokes.  Father is alive  with degenerative arthritis.  Patient has one brother who died with an MI at age 74.  Patient has one brother who is alive and well, one sister with lupus, chronic renal insufficiency on peritoneal dialysis.  REVIEW OF SYSTEMS:  No fevers, chills.  Patient denies headache, double vision.  Does note taste alteration and hearing alteration as above.  Denies shortness of breath, chest pain, nausea, vomiting, abdominal pain.  Denies ______ the bowels or bladder.  Does have some back pain.  Denies blackout episodes previously.  PHYSICAL EXAMINATION  VITAL SIGNS:  Blood pressure 133/92, heart rate 78, respiratory rate 26, temperature afebrile.  GENERAL:  This patient is a markedly obese black male who is alert and cooperative at the time of examination.  HEENT:  Head is atraumatic.  Eyes:  Pupils are equal, round and reactive to light.  Disks:  Soft and flat bilaterally.  NECK:  Supple.  No carotid bruits noted.  RESPIRATORY:  Clear to auscultation and percussion.  CARDIOVASCULAR:  Regular rate and rhythm.  No obvious murmurs or rubs noted.  EXTREMITIES:  Without significant edema.  NEUROLOGIC:  Cranial nerves as above.  Very definite peripheral seventh nerve palsy noted on the left.  Patient is able to close the left eye with full effort.  Patient has normal sensation of the face to pin prick and soft touch bilaterally.  Tympanic membranes are clear bilaterally.  Patient is able to protrude the tongue in the midline.  Patient has normal speech pattern.  No aphasia was noted.  Extraocular movements are full.  Visual fields are full to double simultaneous stimulation.  No aphasia is seen.  Motor test reveals 5/5 strength in all four.  Good symmetric motor, tone is noted throughout. Sensory testing is intact to pin prick, soft touch, vibratory sensation throughout.  Finger-nose-finger, heel-to-shin is symmetric, normal.  No drift is seen.  Patient was not ambulated.  Deep tendon  reflexes are depressed, but symmetric.  Toes are neutral bilaterally.  LABORATORIES:  White count 7.3, hemoglobin 13.3, hematocrit 39.2, MCV 83.8, platelets 277,000.  CT of the head is unremarkable.  IMPRESSION:  Left Bells palsy.  The patient has had an acute onset of left Bells palsy.  Will initiate trial on low dose prednisone with a 5 mg six day pack and get patient on Valtrex for the next five days 500 mg p.o. b.i.d. Patient will follow-up with [facility] with Victorino Dike in three to four weeks.  Patient is to patch the left eye at night. DD:  12/08/00 TD:  12/08/00 Job: [id number]
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
## 2011-07-11 LAB — DIFFERENTIAL
Eosinophils Absolute: 0.2
Eosinophils Relative: 3
Lymphocytes Relative: 20
Lymphs Abs: 1.5
Monocytes Absolute: 0.5
Monocytes Relative: 6

## 2011-07-11 LAB — CBC
MCHC: 33.7
MCV: 83.7
Platelets: 254
RBC: 4.71
RDW: 13.9

## 2011-07-11 LAB — URINALYSIS, ROUTINE W REFLEX MICROSCOPIC
Bilirubin Urine: NEGATIVE
Nitrite: NEGATIVE
Protein, ur: >300 — AB
Specific Gravity, Urine: 1.026
Urobilinogen, UA: 1

## 2011-07-11 LAB — COMPREHENSIVE METABOLIC PANEL
ALT: 14
AST: 24
Albumin: 3.4 — ABNORMAL LOW
CO2: 27
Calcium: 9
Creatinine, Ser: 0.98
GFR calc Af Amer: >60
Sodium: 138
Total Protein: 7.1

## 2011-07-11 LAB — RAPID URINE DRUG SCREEN, HOSP PERFORMED
Amphetamines: NOT DETECTED
Cocaine: NOT DETECTED
Tetrahydrocannabinol: NOT DETECTED

## 2011-07-11 LAB — ACETAMINOPHEN LEVEL: Acetaminophen (Tylenol), Serum: <10 — ABNORMAL LOW

## 2011-07-11 LAB — URINE MICROSCOPIC-ADD ON

## 2011-07-11 LAB — PROTIME-INR: Prothrombin Time: 13.1

## 2011-10-02 ENCOUNTER — Encounter: Payer: Self-pay | Admitting: Emergency Medicine

## 2011-10-02 ENCOUNTER — Emergency Department (HOSPITAL_COMMUNITY)
Admission: EM | Admit: 2011-10-02 | Discharge: 2011-10-02 | Disposition: A | Discharge status: Home / Self Care | Payer: Medicare Other | Attending: Emergency Medicine | Admitting: Emergency Medicine

## 2011-10-02 ENCOUNTER — Emergency Department (HOSPITAL_COMMUNITY): Payer: Medicare Other

## 2011-10-02 DIAGNOSIS — S60229A Contusion of unspecified hand, initial encounter: Secondary | ICD-10-CM | POA: Insufficient documentation

## 2011-10-02 DIAGNOSIS — W208XXA Other cause of strike by thrown, projected or falling object, initial encounter: Secondary | ICD-10-CM | POA: Insufficient documentation

## 2011-10-02 DIAGNOSIS — M79609 Pain in unspecified limb: Secondary | ICD-10-CM | POA: Insufficient documentation

## 2011-10-02 DIAGNOSIS — M7989 Other specified soft tissue disorders: Secondary | ICD-10-CM | POA: Insufficient documentation

## 2011-10-02 HISTORY — DX: Essential (primary) hypertension: I10

## 2011-10-02 HISTORY — DX: Obesity, unspecified: E66.9

## 2011-10-02 HISTORY — DX: Bipolar disorder, unspecified: F31.9

## 2011-10-02 MED ORDER — OXYCODONE-ACETAMINOPHEN 5-325 MG PO TABS
2.0000 | ORAL_TABLET | Freq: Once | ORAL | Status: AC
  Administered 2011-10-02: 2 via ORAL
  Filled 2011-10-02: qty 2
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
## 2012-01-13 ENCOUNTER — Telehealth: Payer: Self-pay | Admitting: Family Medicine

## 2012-01-13 NOTE — Telephone Encounter (Signed)
Pt states he needs a physical before 02-01-12 for his insurance. I do not see any open slots. Is there anywhere I can put him?

## 2012-01-13 NOTE — Telephone Encounter (Signed)
This message is ok to wait for Dr.Lowne's CMA to address once reviewing the schedule

## 2012-01-16 NOTE — Telephone Encounter (Signed)
Done  KP

## 2012-01-26 ENCOUNTER — Ambulatory Visit (INDEPENDENT_AMBULATORY_CARE_PROVIDER_SITE_OTHER): Payer: Medicare Other | Admitting: Family Medicine

## 2012-01-26 ENCOUNTER — Encounter: Payer: Self-pay | Admitting: Family Medicine

## 2012-01-26 VITALS — BP 134/92 | HR 79 | Temp 98.3°F | Ht 74.0 in | Wt >= 6400 oz

## 2012-01-26 DIAGNOSIS — Z0000 Encounter for general adult medical examination without abnormal findings: Secondary | ICD-10-CM

## 2012-01-26 DIAGNOSIS — F209 Schizophrenia, unspecified: Secondary | ICD-10-CM

## 2012-01-26 DIAGNOSIS — I10 Essential (primary) hypertension: Secondary | ICD-10-CM

## 2012-01-26 DIAGNOSIS — Z125 Encounter for screening for malignant neoplasm of prostate: Secondary | ICD-10-CM

## 2012-01-26 DIAGNOSIS — Z23 Encounter for immunization: Secondary | ICD-10-CM

## 2012-01-26 DIAGNOSIS — E119 Type 2 diabetes mellitus without complications: Secondary | ICD-10-CM

## 2012-01-26 DIAGNOSIS — E785 Hyperlipidemia, unspecified: Secondary | ICD-10-CM

## 2012-01-26 DIAGNOSIS — G4733 Obstructive sleep apnea (adult) (pediatric): Secondary | ICD-10-CM

## 2012-01-26 LAB — POCT URINALYSIS DIPSTICK
Bilirubin, UA: NEGATIVE
Glucose, UA: NEGATIVE
Ketones, UA: NEGATIVE
Spec Grav, UA: 1.02
pH, UA: 6

## 2012-01-26 MED ORDER — FREESTYLE SYSTEM KIT: 1.0000 | PACK | Status: AC | PRN

## 2012-01-26 MED ORDER — LISINOPRIL 20 MG PO TABS: 20.0000 mg | ORAL_TABLET | Freq: Every day | ORAL | Status: DC

## 2012-01-26 NOTE — Assessment & Plan Note (Signed)
Pt is noncompliant with meds but blood sugars are controlled per pt He does not have his readings with him

## 2012-01-26 NOTE — Assessment & Plan Note (Signed)
Check labs Pt is off his meds

## 2012-01-26 NOTE — Assessment & Plan Note (Signed)
Discussed diet and exercise with pt 

## 2012-01-26 NOTE — Assessment & Plan Note (Signed)
Pt stopped cpap on his own Need ONO

## 2012-01-26 NOTE — Assessment & Plan Note (Signed)
Pt is off meds I believe pt took himself off meds--- we need records

## 2012-01-26 NOTE — Progress Notes (Signed)
Subjective:    [name] is a 41 y.o. male who presents for Medicare Annual/Subsequent preventive examination.   Preventive Screening-Counseling & Management  Tobacco History  Smoking status  . Current Everyday Smoker -- 0.5 packs/day for 25 years  . Types: Cigarettes  Smokeless tobacco  . Not on file    Problems Prior to Visit 1.   Current Problems (verified) Patient Active Problem List  Diagnoses  . DM w/o Complication Type II  . HYPERLIPIDEMIA  . GOUT, UNSPECIFIED  . MORBID OBESITY  . SCHIZOPHRENIA  . Bipolar Disorder, Unspecified  . OBSTRUCTIVE SLEEP APNEA  . ERECTILE DYSFUNCTION, ORGANIC  . CELLULITIS AND ABSCESS OF OTHER SPECIFIED SITE  . MUSCLE PAIN  . SYNCOPE AND COLLAPSE  . SLEEP APNEA  . CHEST PAIN, ACUTE  . CHEST PAIN-PRECORDIAL  . ANA POSITIVE  . PROTEINURIA  . Unspecified site of ankle sprain and strain    Medications Prior to Visit Current Outpatient Prescriptions on File Prior to Visit  Medication Sig Dispense Refill  . lisinopril (PRINIVIL,ZESTRIL) 20 MG tablet Take 1 tablet (20 mg total) by mouth daily.  90 tablet  3  . naproxen sodium (ANAPROX) 220 MG tablet Take 440 mg by mouth every 8 (eight) hours as needed. For pain         Current Medications (verified) Current Outpatient Prescriptions  Medication Sig Dispense Refill  . glucose monitoring kit (FREESTYLE) monitoring kit 1 each by Does not apply route as needed for other.  1 each    . lisinopril (PRINIVIL,ZESTRIL) 20 MG tablet Take 1 tablet (20 mg total) by mouth daily.  90 tablet  3  . naproxen sodium (ANAPROX) 220 MG tablet Take 440 mg by mouth every 8 (eight) hours as needed. For pain          Allergies (verified) Review of patient's allergies indicates no known allergies.   PAST HISTORY  Family History Family History  Problem Relation Age of Onset  . Hypertension Mother   . Diabetes Mother   . Hyperlipidemia Mother   . Throat cancer      aunt  . Kidney failure    .  Pancreatitis Father   . Arthritis Father   . Heart attack Brother     35  . Heart disease Brother     MI  . Heart attack Maternal Grandmother   . Cancer Maternal Grandmother     breast  . Kidney disease Sister     dialysis  . Lupus Sister   . Stroke Sister   . Diabetes Maternal Aunt   . Heart disease Maternal Aunt   . Diabetes Paternal Aunt   . Heart disease Paternal Aunt     pacemaker  . Heart disease Paternal Uncle     pacemaker  . Diabetes Paternal Grandmother   . Stroke Paternal Aunt   . COPD Maternal Aunt   . Heart disease Maternal Aunt     Social History History  Substance Use Topics  . Smoking status: Current Everyday Smoker -- 0.5 packs/day for 25 years    Types: Cigarettes  . Smokeless tobacco: Not on file  . Alcohol Use: No    Are there smokers in your home (other than you)?  No  Risk Factors Current exercise habits: Home exercise routine includes walking 1 hrs per day.  Dietary issues discussed: na   Cardiac risk factors: diabetes mellitus, dyslipidemia, family history of premature cardiovascular disease, hypertension, male gender, obesity (BMI >= 30 kg/m2), sedentary lifestyle  and smoking/ tobacco exposure.  Depression Screen (Note: if answer to either of the following is "Yes", a more complete depression screening is indicated)   Q1: Over the past two weeks, have you felt down, depressed or hopeless? No  Q2: Over the past two weeks, have you felt little interest or pleasure in doing things? No  Have you lost interest or pleasure in daily life? No  Do you often feel hopeless? No  Do you cry easily over simple problems? No  Activities of Daily Living In your present state of health, do you have any difficulty performing the following activities?:  Driving? No Managing money?  No Feeding yourself? No Getting from bed to chair? No Climbing a flight of stairs? No Preparing food and eating?: No Bathing or showering? No Getting dressed: No Getting to  the toilet? No Using the toilet:No Moving around from place to place: No In the past year have you fallen or had a near fall?:No   Are you sexually active?  Yes  Do you have more than one partner?  No  Hearing Difficulties: No Do you often ask people to speak up or repeat themselves? No Do you experience ringing or noises in your ears? No Do you have difficulty understanding soft or whispered voices? No   Do you feel that you have a problem with memory? No  Do you often misplace items? No  Do you feel safe at home?  No  Cognitive Testing  Alert? Yes  Normal Appearance?Yes  Oriented to person? Yes  Place? Yes   Time? Yes  Recall of three objects?  Yes  Can perform simple calculations? Yes  Displays appropriate judgment?Yes  Can read the correct time from a watch face?Yes   Advanced Directives have been discussed with the patient? Yes   List the Names of Other Physician/Practitioners you currently use: 1.  none  Indicate any recent Medical Services you may have received from other than [facility] providers in the past year (date may be approximate).  Immunization History  Administered Date(s) Administered  . Td 07/29/2010    Screening Tests Health Maintenance  Topic Date Due  . Influenza Vaccine  06/26/2012  . Tetanus/tdap  07/29/2020    All answers were reviewed with the patient and necessary referrals were made:  Loreen Freud, DO   01/26/2012   History reviewed: allergies, current medications, past family history, past medical history, past social history, past surgical history and problem list  Review of Systems  Review of Systems  Constitutional: Negative for activity change, appetite change and fatigue.  HENT: Negative for hearing loss, congestion, tinnitus and ear discharge.   Eyes: Negative for visual disturbance (see optho q1y -- vision corrected to 20/20 with glasses).  Respiratory: Negative for cough, chest tightness and shortness of breath.   Cardiovascular:  Negative for chest pain, palpitations and leg swelling.  Gastrointestinal: Negative for abdominal pain, diarrhea, constipation and abdominal distention.  Genitourinary: Negative for urgency, frequency, decreased urine volume and difficulty urinating.  Musculoskeletal: Negative for back pain, arthralgias and gait problem.  Skin: Negative for color change, pallor and rash.  Neurological: Negative for dizziness, light-headedness, numbness and headaches.  Hematological: Negative for adenopathy. Does not bruise/bleed easily.  Psychiatric/Behavioral: Negative for suicidal ideas, confusion, sleep disturbance, self-injury, dysphoric mood, decreased concentration and agitation.  Pt is able to read and write and can do all ADLs No risk for falling No abuse/ violence in home      Objective:  Vision by Snellen chart: right eye:20/25, left eye:20/25 Blood pressure 134/92, pulse 79, temperature 98.3 F (36.8 C), temperature source Oral, height 6\' 2"  (1.88 m), weight 405 lb (183.707 kg), SpO2 97.00%. Body mass index is 52.00 kg/(m^2).  BP 134/92  Pulse 79  Temp(Src) 98.3 F (36.8 C) (Oral)  Ht 6\' 2"  (1.88 m)  Wt 405 lb (183.707 kg)  BMI 52.00 kg/m2  SpO2 97%  General Appearance:    Alert, cooperative, no distress, appears stated age  Head:    Normocephalic, without obvious abnormality, atraumatic  Eyes:    PERRL, conjunctiva/corneas clear, EOM's intact, fundi    benign, both eyes       Ears:    Normal TM's and external ear canals, both ears  Nose:   Nares normal, septum midline, mucosa normal, no drainage    or sinus tenderness  Throat:   Lips, mucosa, and tongue normal; teeth and gums normal  Neck:   Supple, symmetrical, trachea midline, no adenopathy;       thyroid:  No enlargement/tenderness/nodules; no carotid   bruit or JVD  Back:     Symmetric, no curvature, ROM normal, no CVA tenderness  Lungs:     Clear to auscultation bilaterally, respirations unlabored  Chest wall:    No  tenderness or deformity  Heart:    Regular rate and rhythm, S1 and S2 normal, no murmur, rub   or gallop  Abdomen:     Soft, non-tender, bowel sounds active all four quadrants,    no masses, no organomegaly--obese  Genitalia:    Normal male without lesion, discharge or tenderness  Rectal:    Normal tone, normal prostate, no masses or tenderness;   guaiac negative stool  Extremities:   Extremities normal, atraumatic, no cyanosis or edema  Pulses:   2+ and symmetric all extremities  Skin:   Skin color, texture, turgor normal, no rashes or lesions  Lymph nodes:   Cervical, supraclavicular, and axillary nodes normal  Neurologic:   CNII-XII intact. Normal strength, sensation and reflexes      throughout       Assessment:    cpe     Plan:     During the course of the visit the patient was educated and counseled about appropriate screening and preventive services including:    Influenza vaccine  Td vaccine  Screening electrocardiogram  Prostate cancer screening  Diabetes screening  Glaucoma screening  Smoking cessation counseling  Advanced directives: has an advanced directive - a copy HAS NOT been provided.  Diet review for nutrition referral? Yes ____  Not Indicated __x_--pt refused   Patient Instructions (the written plan) was given to the patient.  Medicare Attestation I have personally reviewed: The patient's medical and social history Their use of alcohol, tobacco or illicit drugs Their current medications and supplements The patient's functional ability including ADLs,fall risks, home safety risks, cognitive, and hearing and visual impairment Diet and physical activities Evidence for depression or mood disorders  The patient's weight, height, BMI, and visual acuity have been recorded in the chart.  I have made referrals, counseling, and provided education to the patient based on review of the above and I have provided the patient with a written personalized  care plan for preventive services.     Loreen Freud, DO   01/26/2012

## 2012-01-26 NOTE — Patient Instructions (Addendum)
Preventive Care for Adults, Male A healthy lifestyle and preventative care can promote health and wellness. Preventative health guidelines for men include the following key practices:  A routine yearly physical is a good way to check with your caregiver about your health and preventative screening. It is a chance to share any concerns and updates on your health, and to receive a thorough exam.   Visit your dentist for a routine exam and preventative care every 6 months. Brush your teeth twice a day and floss once a day. Good oral hygiene prevents tooth decay and gum disease.   The frequency of eye exams is based on your age, health, family medical history, use of contact lenses, and other factors. Follow your caregiver's recommendations for frequency of eye exams.   Eat a healthy diet. Foods like vegetables, fruits, whole grains, low-fat dairy products, and lean protein foods contain the nutrients you need without too many calories. Decrease your intake of foods high in solid fats, added sugars, and salt. Eat the right amount of calories for you.Get information about a proper diet from your caregiver, if necessary.   Regular physical exercise is one of the most important things you can do for your health. Most adults should get at least 150 minutes of moderate-intensity exercise (any activity that increases your heart rate and causes you to sweat) each week. In addition, most adults need muscle-strengthening exercises on 2 or more days a week.   Maintain a healthy weight. The body mass index (BMI) is a screening tool to identify possible weight problems. It provides an estimate of body fat based on height and weight. Your caregiver can help determine your BMI, and can help you achieve or maintain a healthy weight.For adults 20 years and older:   A BMI below 18.5 is considered underweight.   A BMI of 18.5 to 24.9 is normal.   A BMI of 25 to 29.9 is considered overweight.   A BMI of 30 and above  is considered obese.   Maintain normal blood lipids and cholesterol levels by exercising and minimizing your intake of saturated fat. Eat a balanced diet with plenty of fruit and vegetables. Blood tests for lipids and cholesterol should begin at age 20 and be repeated every 5 years. If your lipid or cholesterol levels are high, you are over 50, or you are a high risk for heart disease, you may need your cholesterol levels checked more frequently.Ongoing high lipid and cholesterol levels should be treated with medicines if diet and exercise are not effective.   If you smoke, find out from your caregiver how to quit. If you do not use tobacco, do not start.   If you choose to drink alcohol, do not exceed 2 drinks per day. One drink is considered to be 12 ounces (355 mL) of beer, 5 ounces (148 mL) of wine, or 1.5 ounces (44 mL) of liquor.   Avoid use of street drugs. Do not share needles with anyone. Ask for help if you need support or instructions about stopping the use of drugs.   High blood pressure causes heart disease and increases the risk of stroke. Your blood pressure should be checked at least every 1 to 2 years. Ongoing high blood pressure should be treated with medicines, if weight loss and exercise are not effective.   If you are 45 to 43 years old, ask your caregiver if you should take aspirin to prevent heart disease.   Diabetes screening involves taking a blood   sample to check your fasting blood sugar level. This should be done once every 3 years, after age 45, if you are within normal weight and without risk factors for diabetes. Testing should be considered at a younger age or be carried out more frequently if you are overweight and have at least 1 risk factor for diabetes.   Colorectal cancer can be detected and often prevented. Most routine colorectal cancer screening begins at the age of 50 and continues through age 75. However, your caregiver may recommend screening at an earlier  age if you have risk factors for colon cancer. On a yearly basis, your caregiver may provide home test kits to check for hidden blood in the stool. Use of a small camera at the end of a tube, to directly examine the colon (sigmoidoscopy or colonoscopy), can detect the earliest forms of colorectal cancer. Talk to your caregiver about this at age 50, when routine screening begins. Direct examination of the colon should be repeated every 5 to 10 years through age 75, unless early forms of pre-cancerous polyps or small growths are found.   Hepatitis C blood testing is recommended for all people born from 1945 through 1965 and any individual with known risks for hepatitis C.   Practice safe sex. Use condoms and avoid high-risk sexual practices to reduce the spread of sexually transmitted infections (STIs). STIs include gonorrhea, chlamydia, syphilis, trichomonas, herpes, HPV, and human immunodeficiency virus (HIV). Herpes, HIV, and HPV are viral illnesses that have no cure. They can result in disability, cancer, and death.   A one-time screening for abdominal aortic aneurysm (AAA) and surgical repair of large AAAs by sound wave imaging (ultrasonography) is recommended for ages 65 to 75 years who are current or former smokers.   Healthy men should no longer receive prostate-specific antigen (PSA) blood tests as part of routine cancer screening. Consult with your caregiver about prostate cancer screening.   Testicular cancer screening is not recommended for adult males who have no symptoms. Screening includes self-exam, caregiver exam, and other screening tests. Consult with your caregiver about any symptoms you have or any concerns you have about testicular cancer.   Use sunscreen with skin protection factor (SPF) of 30 or more. Apply sunscreen liberally and repeatedly throughout the day. You should seek shade when your shadow is shorter than you. Protect yourself by wearing long sleeves, pants, a  wide-brimmed hat, and sunglasses year round, whenever you are outdoors.   Once a month, do a whole body skin exam, using a mirror to look at the skin on your back. Notify your caregiver of new moles, moles that have irregular borders, moles that are larger than a pencil eraser, or moles that have changed in shape or color.   Stay current with required immunizations.   Influenza. You need a dose every fall (or winter). The composition of the flu vaccine changes each year, so being vaccinated once is not enough.   Pneumococcal polysaccharide. You need 1 to 2 doses if you smoke cigarettes or if you have certain chronic medical conditions. You need 1 dose at age 65 (or older) if you have never been vaccinated.   Tetanus, diphtheria, pertussis (Tdap, Td). Get 1 dose of Tdap vaccine if you are younger than age 65 years, are over 65 and have contact with an infant, are a healthcare worker, or simply want to be protected from whooping cough. After that, you need a Td booster dose every 10 years. Consult your caregiver if   you have not had at least 3 tetanus and diphtheria-containing shots sometime in your life or have a deep or dirty wound.   HPV. This vaccine is recommended for males 13 through 43 years of age. This vaccine may be given to men 22 through 43 years of age who have not completed the 3 dose series. It is recommended for men through age 26 who have sex with men or whose immune system is weakened because of HIV infection, other illness, or medications. The vaccine is given in 3 doses over 6 months.   Measles, mumps, rubella (MMR). You need at least 1 dose of MMR if you were born in 1957 or later. You may also need a 2nd dose.   Meningococcal. If you are age 19 to 21 years and a first-year college student living in a residence hall, or have one of several medical conditions, you need to get vaccinated against meningococcal disease. You may also need additional booster doses.   Zoster (shingles).  If you are age 60 years or older, you should get this vaccine.   Varicella (chickenpox). If you have never had chickenpox or you were vaccinated but received only 1 dose, talk to your caregiver to find out if you need this vaccine.   Hepatitis A. You need this vaccine if you have a specific risk factor for hepatitis A virus infection, or you simply wish to be protected from this disease. The vaccine is usually given as 2 doses, 6 to 18 months apart.   Hepatitis B. You need this vaccine if you have a specific risk factor for hepatitis B virus infection or you simply wish to be protected from this disease. The vaccine is given in 3 doses, usually over 6 months.  Preventative Service / Frequency Ages 19 to 39  Blood pressure check.** / Every 1 to 2 years.   Lipid and cholesterol check.** / Every 5 years beginning at age 20.   Hepatitis C blood test.** / For any individual with known risks for hepatitis C.   Skin self-exam. / Monthly.   Influenza immunization.** / Every year.   Pneumococcal polysaccharide immunization.** / 1 to 2 doses if you smoke cigarettes or if you have certain chronic medical conditions.   Tetanus, diphtheria, pertussis (Tdap,Td) immunization. / A one-time dose of Tdap vaccine. After that, you need a Td booster dose every 10 years.   HPV immunization. / 3 doses over 6 months, if 26 and younger.   Measles, mumps, rubella (MMR) immunization. / You need at least 1 dose of MMR if you were born in 1957 or later. You may also need a 2nd dose.   Meningococcal immunization. / 1 dose if you are age 19 to 21 years and a first-year college student living in a residence hall, or have one of several medical conditions, you need to get vaccinated against meningococcal disease. You may also need additional booster doses.   Varicella immunization.** / Consult your caregiver.   Hepatitis A immunization.** / Consult your caregiver. 2 doses, 6 to 18 months apart.   Hepatitis B  immunization.** / Consult your caregiver. 3 doses usually over 6 months.  Ages 40 to 64  Blood pressure check.** / Every 1 to 2 years.   Lipid and cholesterol check.** / Every 5 years beginning at age 20.   Fecal occult blood test (FOBT) of stool. / Every year beginning at age 50 and continuing until age 75. You may not have to do this test if   you get colonoscopy every 10 years.   Flexible sigmoidoscopy** or colonoscopy.** / Every 5 years for a flexible sigmoidoscopy or every 10 years for a colonoscopy beginning at age 47 and continuing until age 64.   Hepatitis C blood test.** / For all people born from [birth year] through 1965 and any individual with known risks for hepatitis C.   Skin self-exam. / Monthly.   Influenza immunization.** / Every year.   Pneumococcal polysaccharide immunization.** / 1 to 2 doses if you smoke cigarettes or if you have certain chronic medical conditions.   Tetanus, diphtheria, pertussis (Tdap/Td) immunization.** / A one-time dose of Tdap vaccine. After that, you need a Td booster dose every 10 years.   Measles, mumps, rubella (MMR) immunization. / You need at least 1 dose of MMR if you were born in 1957 or later. You may also need a 2nd dose.   Varicella immunization.**/ Consult your caregiver.   Meningococcal immunization.** / Consult your caregiver.   Hepatitis A immunization.** / Consult your caregiver. 2 doses, 6 to 18 months apart.   Hepatitis B immunization.** / Consult your caregiver. 3 doses, usually over 6 months.  Ages 20 and over  Blood pressure check.** / Every 1 to 2 years.   Lipid and cholesterol check.**/ Every 5 years beginning at age 63.   Fecal occult blood test (FOBT) of stool. / Every year beginning at age 91 and continuing until age 18. You may not have to do this test if you get colonoscopy every 10 years.   Flexible sigmoidoscopy** or colonoscopy.** / Every 5 years for a flexible sigmoidoscopy or every 10 years for a colonoscopy  beginning at age 70 and continuing until age 52.   Hepatitis C blood test.** / For all people born from [birth year] through 1965 and any individual with known risks for hepatitis C.   Abdominal aortic aneurysm (AAA) screening.** / A one-time screening for ages 70 to 74 years who are current or former smokers.   Skin self-exam. / Monthly.   Influenza immunization.** / Every year.   Pneumococcal polysaccharide immunization.** / 1 dose at age 56 (or older) if you have never been vaccinated.   Tetanus, diphtheria, pertussis (Tdap, Td) immunization. / A one-time dose of Tdap vaccine if you are over 65 and have contact with an infant, are a Research scientist (physical sciences), or simply want to be protected from whooping cough. After that, you need a Td booster dose every 10 years.   Varicella immunization. ** / Consult your caregiver.   Meningococcal immunization.** / Consult your caregiver.   Hepatitis A immunization. ** / Consult your caregiver. 2 doses, 6 to 18 months apart.   Hepatitis B immunization.** / Check with your caregiver. 3 doses, usually over 6 months.  **Family history and personal history of risk and conditions may change your caregiver's recommendations. Document Released: 11/08/2001 Document Revised: 09/01/2011 Document Reviewed: 02/07/2011 [facility] Patient Information 2012 [place], [place]. Smoking Cessation, Tips for Success YOU CAN QUIT SMOKING If you are ready to quit smoking, congratulations! You have chosen to help yourself be healthier. Cigarettes bring nicotine, tar, carbon monoxide, and other irritants into your body. Your lungs, heart, and blood vessels will be able to work better without these poisons. There are many different ways to quit smoking. Nicotine gum, nicotine patches, a nicotine inhaler, or nicotine nasal spray can help with physical craving. Hypnosis, support groups, and medicines help break the habit of smoking. Here are some tips to help you quit for good.  Throw  away all  cigarettes.   Clean and remove all ashtrays from your home, work, and car.   On a card, write down your reasons for quitting. Carry the card with you and read it when you get the urge to smoke.   Cleanse your body of nicotine. Drink enough water and fluids to keep your urine clear or pale yellow. Do this after quitting to flush the nicotine from your body.   Learn to predict your moods. Do not let a bad situation be your excuse to have a cigarette. Some situations in your life might tempt you into wanting a cigarette.   Never have "just one" cigarette. It leads to wanting another and another. Remind yourself of your decision to quit.   Change habits associated with smoking. If you smoked while driving or when feeling stressed, try other activities to replace smoking. Stand up when drinking your coffee. Brush your teeth after eating. Sit in a different chair when you read the paper. Avoid alcohol while trying to quit, and try to drink fewer caffeinated beverages. Alcohol and caffeine may urge you to smoke.   Avoid foods and drinks that can trigger a desire to smoke, such as sugary or spicy foods and alcohol.   Ask people who smoke not to smoke around you.   Have something planned to do right after eating or having a cup of coffee. Take a walk or exercise to perk you up. This will help to keep you from overeating.   Try a relaxation exercise to calm you down and decrease your stress. Remember, you may be tense and nervous for the first 2 weeks after you quit, but this will pass.   Find new activities to keep your hands busy. Play with a pen, coin, or rubber band. Doodle or draw things on paper.   Brush your teeth right after eating. This will help cut down on the craving for the taste of tobacco after meals. You can try mouthwash, too.   Use oral substitutes, such as lemon drops, carrots, a cinnamon stick, or chewing gum, in place of cigarettes. Keep them handy so they are available when you  have the urge to smoke.   When you have the urge to smoke, try deep breathing.   Designate your home as a nonsmoking area.   If you are a heavy smoker, ask your caregiver about a prescription for nicotine chewing gum. It can ease your withdrawal from nicotine.   Reward yourself. Set aside the cigarette money you save and buy yourself something nice.   Look for support from others. Join a support group or smoking cessation program. Ask someone at home or at work to help you with your plan to quit smoking.   Always ask yourself, "Do I need this cigarette or is this just a reflex?" Tell yourself, "Today, I choose not to smoke," or "I do not want to smoke." You are reminding yourself of your decision to quit, even if you do smoke a cigarette.  HOW WILL I FEEL WHEN I QUIT SMOKING?  The benefits of not smoking start within days of quitting.   You may have symptoms of withdrawal because your body is used to nicotine (the addictive substance in cigarettes). You may crave cigarettes, be irritable, feel very hungry, cough often, get headaches, or have difficulty concentrating.   The withdrawal symptoms are only temporary. They are strongest when you first quit but will go away within 10 to 14 days.   When  withdrawal symptoms occur, stay in control. Think about your reasons for quitting. Remind yourself that these are signs that your body is healing and getting used to being without cigarettes.   Remember that withdrawal symptoms are easier to treat than the major diseases that smoking can cause.   Even after the withdrawal is over, expect periodic urges to smoke. However, these cravings are generally short-lived and will go away whether you smoke or not. Do not smoke!   If you relapse and smoke again, do not lose hope. Most smokers quit 3 times before they are successful.   If you relapse, do not give up! Plan ahead and think about what you will do the next time you get the urge to smoke.  LIFE  AS A NONSMOKER: MAKE IT FOR A MONTH, MAKE IT FOR LIFE Day 1: Hang this page where you will see it every day. Day 2: Get rid of all ashtrays, matches, and lighters. Day 3: Drink water. Breathe deeply between sips. Day 4: Avoid places with smoke-filled air, such as bars, clubs, or the smoking section of restaurants. Day 5: Keep track of how much money you save by not smoking. Day 6: Avoid boredom. Keep a good book with you or go to the movies. Day 7: Reward yourself! One week without smoking! Day 8: Make a dental appointment to get your teeth cleaned. Day 9: Decide how you will turn down a cigarette before it is offered to you. Day 10: Review your reasons for quitting. Day 11: Distract yourself. Stay active to keep your mind off smoking and to relieve tension. Take a walk, exercise, read a book, do a crossword puzzle, or try a new hobby. Day 12: Exercise. Get off the bus before your stop or use stairs instead of escalators. Day 13: Call on friends for support and encouragement. Day 14: Reward yourself! Two weeks without smoking! Day 15: Practice deep breathing exercises. Day 16: Bet a friend that you can stay a nonsmoker. Day 17: Ask to sit in nonsmoking sections of restaurants. Day 18: Hang up "No Smoking" signs. Day 19: Think of yourself as a nonsmoker. Day 20: Each morning, tell yourself you will not smoke. Day 21: Reward yourself! Three weeks without smoking! Day 22: Think of smoking in negative ways. Remember how it stains your teeth, gives you bad breath, and leaves you short of breath. Day 23: Eat a nutritious breakfast. Day 24:Do not relive your days as a smoker. Day 25: Hold a pencil in your hand when talking on the telephone. Day 26: Tell all your friends you do not smoke. Day 27: Think about how much better food tastes. Day 28: Remember, one cigarette is one too many. Day 29: Take up a hobby that will keep your hands busy. Day 30: Congratulations! One month without smoking!  Give yourself a big reward. Your caregiver can direct you to community resources or hospitals for support, which may include:  Group support.   Education.   Hypnosis.   Subliminal therapy.  Document Released: 06/10/2004 Document Revised: 09/01/2011 Document Reviewed: 06/29/2009 [facility] Patient Information 2012 [place], [place]. You Can Quit Smoking If you are ready to quit smoking or are thinking about it, congratulations! You have chosen to help yourself be healthier and live longer! There are lots of different ways to quit smoking. Nicotine gum, nicotine patches, a nicotine inhaler, or nicotine nasal spray can help with physical craving. Hypnosis, support groups, and medicines help break the habit of smoking. TIPS TO GET  OFF AND STAY OFF CIGARETTES  Learn to predict your moods. Do not let a bad situation be your excuse to have a cigarette. Some situations in your life might tempt you to have a cigarette.   Ask friends and co-workers not to smoke around you.   Make your home smoke-free.   Never have "just one" cigarette. It leads to wanting another and another. Remind yourself of your decision to quit.   On a card, make a list of your reasons for not smoking. Read it at least the same number of times a day as you have a cigarette. Tell yourself everyday, "I do not want to smoke. I choose not to smoke."   Ask someone at home or work to help you with your plan to quit smoking.   Have something planned after you eat or have a cup of coffee. Take a walk or get other exercise to perk you up. This will help to keep you from overeating.   Try a relaxation exercise to calm you down and decrease your stress. Remember, you may be tense and nervous the first two weeks after you quit. This will pass.   Find new activities to keep your hands busy. Play with a pen, coin, or rubber band. Doodle or draw things on paper.   Brush your teeth right after eating. This will help cut down the craving for  the taste of tobacco after meals. You can try mouthwash too.   Try gum, breath mints, or diet candy to keep something in your mouth.  IF YOU SMOKE AND WANT TO QUIT:  Do not stock up on cigarettes. Never buy a carton. Wait until one pack is finished before you buy another.   Never carry cigarettes with you at work or at home.   Keep cigarettes as far away from you as possible. Leave them with someone else.   Never carry matches or a lighter with you.   Ask yourself, "Do I need this cigarette or is this just a reflex?"   Bet with someone that you can quit. Put cigarette money in a piggy bank every morning. If you smoke, you give up the money. If you do not smoke, by the end of the week, you keep the money.   Keep trying. It takes 21 days to change a habit!   Talk to your doctor about using medicines to help you quit. These include nicotine replacement gum, lozenges, or skin patches.  Document Released: 07/09/2009 Document Revised: 09/01/2011 Document Reviewed: 07/09/2009 [facility] Patient Information 2012 [place], [place]. Thank you for enrolling in MyChart. Please follow the instructions below to securely access your online medical record. MyChart allows you to send messages to your doctor, view your test results, manage appointments, and more.   How Do I Sign Up? 1. In your Internet browser, go to [email or web address]. 2. Click on the Sign Up Now link in the Sign In box. You will see the New Member Sign Up page. 3. Enter your MyChart Access Code exactly as it appears below. You will not need to use this code after you've completed the sign-up process. If you do not sign up before the expiration date, you must request a new code. MyChart Access Code: [email or web address] Expires: 02/25/2012  1:56 PM  4. Enter your Social Security Number ([id number]) and Date of Birth (mm/dd/yyyy) as indicated and click Submit. You will be taken to the next sign-up page. 5. Create a MyChart ID. This  will be your MyChart login  ID and cannot be changed, so think of one that is secure and easy to remember. 6. Create a MyChart password. You can change your password at any time. 7. Enter your Password Reset Question and Answer. This can be used at a later time if you forget your password.  8. Enter your e-mail address. You will receive e-mail notification when new information is available in MyChart. 9. Click Sign Up. You can now view your medical record.   Additional Information If you have questions, you can call [phone]) to talk to our MyChart staff. Remember, MyChart is NOT to be used for urgent needs. For medical emergencies, dial 911.

## 2012-01-27 ENCOUNTER — Ambulatory Visit: Payer: Medicare Other

## 2012-01-27 DIAGNOSIS — E119 Type 2 diabetes mellitus without complications: Secondary | ICD-10-CM

## 2012-01-27 LAB — CBC WITH DIFFERENTIAL/PLATELET
Eosinophils Relative: 4 % (ref 0.0–5.0)
HCT: 44.7 % (ref 39.0–52.0)
Hemoglobin: 14.6 g/dL (ref 13.0–17.0)
Lymphs Abs: 2.6 10*3/uL (ref 0.7–4.0)
MCV: 90.3 fl (ref 78.0–100.0)
Monocytes Absolute: 0.9 10*3/uL (ref 0.1–1.0)
Monocytes Relative: 14.2 % — ABNORMAL HIGH (ref 3.0–12.0)
Neutro Abs: 2.4 10*3/uL (ref 1.4–7.7)
Platelets: 213 10*3/uL (ref 150.0–400.0)
RDW: 13.9 % (ref 11.5–14.6)
WBC: 6.2 10*3/uL (ref 4.5–10.5)

## 2012-01-27 LAB — BASIC METABOLIC PANEL
BUN: 9 mg/dL (ref 6–23)
Chloride: 102 mEq/L (ref 96–112)
GFR: 89.99 mL/min (ref >60.00)
Glucose, Bld: 116 mg/dL — ABNORMAL HIGH (ref 70–99)
Potassium: 4.3 mEq/L (ref 3.5–5.1)
Sodium: 138 mEq/L (ref 135–145)

## 2012-01-27 LAB — HEPATIC FUNCTION PANEL
ALT: 22 U/L (ref 0–53)
AST: 26 U/L (ref 0–37)
Albumin: 3.7 g/dL (ref 3.5–5.2)

## 2012-01-27 LAB — LIPID PANEL
Cholesterol: 250 mg/dL — ABNORMAL HIGH (ref 0–200)
Triglycerides: 403 mg/dL — ABNORMAL HIGH (ref 0.0–149.0)

## 2012-01-27 LAB — HEMOGLOBIN A1C: Hgb A1c MFr Bld: 8.1 % — ABNORMAL HIGH (ref <5.7)

## 2012-01-27 LAB — MICROALBUMIN / CREATININE URINE RATIO
Creatinine, Urine: 186.9 mg/dL
Microalb Creat Ratio: 1105.8 mg/g — ABNORMAL HIGH (ref 0.0–30.0)
Microalb, Ur: 206.67 mg/dL — ABNORMAL HIGH (ref 0.00–1.89)

## 2012-01-28 LAB — URINE CULTURE
Colony Count: NO GROWTH
Organism ID, Bacteria: NO GROWTH

## 2012-01-30 ENCOUNTER — Other Ambulatory Visit: Payer: Medicare Other

## 2012-01-30 ENCOUNTER — Telehealth: Payer: Self-pay

## 2012-01-30 DIAGNOSIS — G4730 Sleep apnea, unspecified: Secondary | ICD-10-CM

## 2012-01-30 MED ORDER — ATORVASTATIN CALCIUM 20 MG PO TABS: 20.0000 mg | ORAL_TABLET | Freq: Every day | ORAL | Status: DC

## 2012-01-30 MED ORDER — METFORMIN HCL ER 500 MG PO TB24: 1000.0000 mg | ORAL_TABLET | Freq: Every day | ORAL | Status: DC

## 2012-01-30 NOTE — Telephone Encounter (Signed)
Message copied by Arnette Norris on Mon Jan 30, 2012  8:37 AM ------      Message from: Lelon Perla      Created: Sun Jan 29, 2012  9:29 PM       DM and cholesterol is not controlled.    Add hgba1c to labs  250.00      Start metformin xr  500  2 po qd  #60   2 refills         Refer to nutritionist      Start lipitor 20 mg  #30  1 po qhs,  2 refills      Recheck 3 months------250.00,  272.4  Lipid, hep, bmp, hgba1c, microalbumin,        We also need 24 hour urine protein 250.00

## 2012-01-30 NOTE — Telephone Encounter (Signed)
Discussed with patient and he voiced understanding, he picked up the 24 hour urine container and his meds have been sent to the pharmacy.    KP

## 2012-02-08 ENCOUNTER — Encounter: Payer: Medicare Other | Admitting: *Deleted

## 2012-02-22 ENCOUNTER — Institutional Professional Consult (permissible substitution): Payer: Medicare Other | Admitting: Pulmonary Disease

## 2012-02-29 ENCOUNTER — Telehealth: Payer: Self-pay | Admitting: Family Medicine

## 2012-02-29 NOTE — Telephone Encounter (Signed)
Will forward to [facility] for review      KP

## 2012-02-29 NOTE — Telephone Encounter (Signed)
Please call DMV and tell them we can not fill out forms until he sees pulm and psych--- remind pt about psych as well.

## 2012-02-29 NOTE — Telephone Encounter (Signed)
Pt states he needs Dr. Laury Axon to call the [facility] regarding his appt with pulmonary specialist. He needs an extension and the only way for him to get one is if his doctor calls and confirms that he does have an appt in July.  DMV- [phone]

## 2012-03-01 ENCOUNTER — Encounter: Payer: Self-pay | Admitting: Family Medicine

## 2012-03-01 NOTE — Telephone Encounter (Signed)
Letter done

## 2012-03-01 NOTE — Telephone Encounter (Signed)
I contacted DMV and was made aware that we will need to fax over a letter requesting and extension on the forms. This is to be faxed to the Medical Review unit at [phone].      KP

## 2012-03-01 NOTE — Telephone Encounter (Signed)
Faxed.   KP 

## 2012-03-07 ENCOUNTER — Encounter: Payer: Medicare Other | Attending: Family Medicine | Admitting: *Deleted

## 2012-03-07 VITALS — Ht 75.0 in | Wt >= 6400 oz

## 2012-03-07 DIAGNOSIS — E119 Type 2 diabetes mellitus without complications: Secondary | ICD-10-CM | POA: Insufficient documentation

## 2012-03-07 DIAGNOSIS — Z713 Dietary counseling and surveillance: Secondary | ICD-10-CM | POA: Insufficient documentation

## 2012-03-18 ENCOUNTER — Encounter: Payer: Self-pay | Admitting: *Deleted

## 2012-03-18 NOTE — Patient Instructions (Signed)
Attend Core Diabetes Courses II and III as scheduled or follow up prn.

## 2012-03-18 NOTE — Progress Notes (Signed)
  Patient was seen on 03/07/12 for the first of a series of three diabetes self-management courses at the [facility]. The following learning objectives were met by the patient during this course:   Defines the role of glucose and insulin  Identifies type of diabetes and pathophysiology  Defines the diagnostic criteria for diabetes and prediabetes  States the risk factors for Type 2 Diabetes  States the symptoms of Type 2 Diabetes  Defines Type 2 Diabetes treatment goals  Defines Type 2 Diabetes treatment options  States the rationale for glucose monitoring  Identifies A1C, glucose targets, and testing times  Identifies proper sharps disposal  Defines the purpose of a diabetes food plan  Identifies carbohydrate food groups  Defines effects of carbohydrate foods on glucose levels  Identifies carbohydrate choices/grams/food labels  States benefits of physical activity and effect on glucose  Review of suggested activity guidelines  Lab Results  Component Value Date   HGBA1C 8.1* 01/27/2012   Handouts given during class include:  Type 2 Diabetes: Basics Book  My Food Plan Book  Food and Activity Log  Patient has established the following initial goals:  Increase exercise.   Stop smoking.  Lose weight.  Follow-Up Plan: Patient will attend Core Diabetes Courses II and III as scheduled or follow up prn.

## 2012-03-28 ENCOUNTER — Encounter: Payer: Self-pay | Admitting: Pulmonary Disease

## 2012-03-28 ENCOUNTER — Ambulatory Visit (INDEPENDENT_AMBULATORY_CARE_PROVIDER_SITE_OTHER): Payer: Medicare Other | Admitting: Pulmonary Disease

## 2012-03-28 VITALS — BP 120/74 | HR 72 | Temp 98.6°F | Ht 74.5 in | Wt >= 6400 oz

## 2012-03-28 DIAGNOSIS — G4733 Obstructive sleep apnea (adult) (pediatric): Secondary | ICD-10-CM

## 2012-03-28 NOTE — Patient Instructions (Signed)
Will schedule sleep study Will call to schedule follow up after sleep study reviewed 

## 2012-03-28 NOTE — Progress Notes (Signed)
Chief Complaint  Patient presents with  . office visit    Pt states he turned in his cpap machine back into [facility] 3-4 months ago. he states he has been doing better w/o it. Pt states he is sleeping all night 8-8.5 hrs a night.    History of Present Illness: [name] is a 41 y.o. male with OSA.  He last saw Dr. Shelle Iron in November 2011.  He had sleep study in 2007 showing severe sleep apnea (PSG 07/20/06>>RDI 65).  He was started on CPAP therapy.  He had difficulty tolerating CPAP, and turned his machine to his DME several months ago.  He did not feel like he had a problem with his sleep, and did not understand why he would need to continue CPAP therapy.  He has continued snoring, and has history of hypertension and diabetes.  As a result he was advised to reconsider CPAP therapy.  He goes to bed at 930 pm, and falls asleep quickly.  He sleeps through the night.  He wakes up at 8 am, and feels okay.  He does not use anything to help him sleep or stay awake.  Epworth score is 6 out of 24.  The patient denies sleep walking, sleep talking, bruxism, or nightmares.  There is no history of restless legs.  The patient denies sleep hallucinations, sleep paralysis, or cataplexy.   Past Medical History  Diagnosis Date  . Diabetes mellitus   . Obesity   . Hypertension   . Bipolar affective disorder   . Sleep apnea   . Gout   . Hyperlipidemia     Per pt - reported on 03/07/12    Past Surgical History  Procedure Date  . Keloid excision   . Vasectomy     Outpatient Encounter Prescriptions as of 03/28/2012  Medication Sig Dispense Refill  . atorvastatin (LIPITOR) 20 MG tablet Take 1 tablet (20 mg total) by mouth daily.  30 tablet  2  . glucose monitoring kit (FREESTYLE) monitoring kit 1 each by Does not apply route as needed for other.  1 each    . lisinopril (PRINIVIL,ZESTRIL) 20 MG tablet Take 1 tablet (20 mg total) by mouth daily.  90 tablet  3  . metFORMIN (GLUCOPHAGE XR) 500 MG  24 hr tablet Take 2 tablets (1,000 mg total) by mouth daily with breakfast.  60 tablet  2  . DISCONTD: naproxen sodium (ANAPROX) 220 MG tablet Take 440 mg by mouth every 8 (eight) hours as needed. For pain         No Known Allergies  Physical Exam:  Blood pressure 120/74, pulse 72, temperature 98.6 F (37 C), temperature source Oral, height 6' 2.5" (1.892 m), weight 408 lb 9.6 oz (185.34 kg), SpO2 95.00%.  Body mass index is 51.76 kg/(m^2).  Wt Readings from Last 2 Encounters:  03/28/12 408 lb 9.6 oz (185.34 kg)  03/07/12 407 lb 1.6 oz (184.659 kg)   General - Obese HEENT - no sinus tenderness, no oral exudate, MP 4, no LAN Cardiac - s1s2 regular Chest - no wheeze/rales Abd - soft, non tender Ext - no edema Neuro - normal strength Psych - normal mood, behavior    Assessment/Plan:  Coralyn Helling, MD [place] [facility]/Critical Care/Sleep Pager:  [phone] 03/28/2012, 2:53 PM

## 2012-03-28 NOTE — Assessment & Plan Note (Signed)
He has history of sleep apnea.  He has snoring.  He has history of HTN and DM.  He was not sure how sleep apnea affected his health, and was not sure if CPAP was beneficial.    I have reviewed his sleep test results with the patient.  Explained how sleep apnea can affect the patient's health.  Driving precautions and importance of weight loss were discussed.  Treatment options for sleep apnea were reviewed.  He is willing to reconsider therapy for sleep apnea.  Will arrange for in lab study to assess current status of his sleep apnea.

## 2012-04-03 ENCOUNTER — Encounter: Payer: Medicare Other | Attending: Family Medicine

## 2012-04-03 DIAGNOSIS — Z713 Dietary counseling and surveillance: Secondary | ICD-10-CM | POA: Insufficient documentation

## 2012-04-03 DIAGNOSIS — E119 Type 2 diabetes mellitus without complications: Secondary | ICD-10-CM | POA: Insufficient documentation

## 2012-04-05 ENCOUNTER — Ambulatory Visit: Payer: Medicare Other

## 2012-04-13 ENCOUNTER — Telehealth: Payer: Self-pay | Admitting: Family Medicine

## 2012-04-13 NOTE — Telephone Encounter (Signed)
Faxed to Medical Review unit at [phone]   [facility]

## 2012-04-13 NOTE — Telephone Encounter (Signed)
Please advise      KP 

## 2012-04-13 NOTE — Telephone Encounter (Signed)
Patient states he needs [place] to call the [facility] Medical Review Board at [phone] and let them know that the pt has appt for sleep study on 7/24 and that is why we have not sent in the pts paperwork. Pt states they will revoke his license unless we inform them that the issue is still pending.

## 2012-04-13 NOTE — Telephone Encounter (Signed)
Maybe because we sent one in June, Im not sure.   KP

## 2012-04-13 NOTE — Telephone Encounter (Signed)
Ok to fax them note or call but I'm curious as to why he didn't ask Dr Craige Cotta to do it when he was there.

## 2012-04-17 ENCOUNTER — Ambulatory Visit (HOSPITAL_BASED_OUTPATIENT_CLINIC_OR_DEPARTMENT_OTHER): Payer: Medicare Other | Attending: Pulmonary Disease | Admitting: Radiology

## 2012-04-17 VITALS — Ht 75.0 in | Wt >= 6400 oz

## 2012-04-17 DIAGNOSIS — E119 Type 2 diabetes mellitus without complications: Secondary | ICD-10-CM | POA: Insufficient documentation

## 2012-04-17 DIAGNOSIS — I10 Essential (primary) hypertension: Secondary | ICD-10-CM | POA: Insufficient documentation

## 2012-04-17 DIAGNOSIS — Z79899 Other long term (current) drug therapy: Secondary | ICD-10-CM | POA: Insufficient documentation

## 2012-04-17 DIAGNOSIS — G4733 Obstructive sleep apnea (adult) (pediatric): Secondary | ICD-10-CM | POA: Insufficient documentation

## 2012-04-30 ENCOUNTER — Telehealth: Payer: Self-pay | Admitting: Pulmonary Disease

## 2012-04-30 DIAGNOSIS — G4733 Obstructive sleep apnea (adult) (pediatric): Secondary | ICD-10-CM

## 2012-04-30 NOTE — Telephone Encounter (Signed)
PSG 04/17/12>>AHI 71, SpO2 low 78%.  CPAP 18 cm H2O>>+R, +S.  Discussed with pt over phone.  Explained he has severe OSA, and needs to start CPAP.  He is agreeable to this.  Will arrange for CPAP 18 cm H2O.  Will have my nurse schedule ROV 2 months after CPAP set up.

## 2012-05-01 NOTE — Procedures (Signed)
[name], [name]               ACCOUNT NO.:  [id number]  MEDICAL RECORD NO.:  [id number]          PATIENT TYPE:  OUT  LOCATION:  SLEEP CENTER                 FACILITY[facility]  [facility]  PHYSICIAN:  Coralyn Helling, MD        DATE OF BIRTH:  May 19, 1971  DATE OF STUDY:  04/17/2012                           NOCTURNAL POLYSOMNOGRAM  REFERRING PHYSICIAN:  Coralyn Helling, MD  FACILITY:  [facility].  INDICATION FOR STUDY:  Mr. Kalas is a 41 year old male who has a history of hypertension and diabetes.  He has a previous diagnosis of obstructive sleep apnea, but had not been on therapy for this.  He has continued difficulties with sleep disruption, snoring, witnessed apnea, and daytime sleepiness.  He was referred to the sleep lab for further evaluation of hypersomnia with obstructive sleep apnea.  Height is 63 inches, weight is 404 pounds, BMI is 50, neck size is 20.5 inches.  EPWORTH SLEEPINESS SCORE:  5.  MEDICATIONS:  Metformin, lisinopril, and atorvastatin.  SLEEP ARCHITECTURE:  The patient followed a split night study protocol. During the diagnostic portion of the study, the total recording time was 168 minutes.  Total sleep time was 131 minutes.  Sleep efficiency was 77%.  Sleep latency was 4.5 minutes.  This portion of the study was notable for lack of slow-wave sleep and REM sleep.  He slept in both the supine and nonsupine positions.  During the titration portion of the study, total recording time was 235 minutes.  Total sleep time was 201 minutes.  Sleep efficiency was 85%. Sleep latency was 17.5 minutes.  REM latency was 48 minutes.  This portion of study was notable for lack of slow-wave sleep, and he slept in both the supine and nonsupine positions.  RESPIRATORY DATA:  The average respiratory rate was 18.  During the diagnostic portion of study, the overall apnea-hypopnea index was 71. There was 1 mixed respiratory event.  The remainder of the events were obstructive  in nature.  During the titration portion of the study, the patient was started on CPAP of 5 and increased to 19 cm of water.  With CPAP set at 18 cm of water, the apnea-hypopnea index was reduced to 0 and the patient was observed in REM sleep and supine sleep.  OXYGEN DATA:  The baseline oxygenation was 96%.  The oxygen saturation nadir was 78%.  The study was conducted without the use of supplemental oxygen.  CARDIAC DATA:  The average heart rate was 55 and the rhythm strip showed sinus rhythm and occasional PACs.  MOVEMENT-PARASOMNIA:  The periodic limb movement index was 0 and the patient had 1 restroom trip.  IMPRESSION-RECOMMENDATIONS:  This patient has evidence for severe obstructive sleep apnea with an apnea-hypopnea index of 71, and oxygen saturation nadir of 78%.  He had good control of his sleep-disordered breathing with CPAP set at 18 cm of water.  He was fitted with a large ResMed Mirage Quattro full- face mask.  In addition to diet, exercise, and weight reduction, I would recommend the patient be started on CPAP of 18 cm of water and monitored for his clinical response.  If he does have difficulty  tolerating high CPAP pressures, then consideration may need to be given to trying him on BiPAP therapy.     Coralyn Helling, MD Diplomat, American Board of Sleep Medicine    VS/MEDQ  D:  04/30/2012 14:48:16  T:  05/01/2012 03:58:19  Job:  [id number]

## 2012-05-08 NOTE — Telephone Encounter (Signed)
Pt returned call. [name]  

## 2012-05-08 NOTE — Telephone Encounter (Signed)
lmomtcb x1 for pt--needs appt 2 months after being set up w/ cpap (october schedule is out)

## 2012-05-08 NOTE — Telephone Encounter (Signed)
Pt returned Mindy's call & stated that he will be getting his CPAP on 05/29/12.  November schedule is not out yet.  Do you want to schedule towards the end of Oct or wait until the schedule for November opens up?  Antionette Fairy'

## 2012-05-08 NOTE — Telephone Encounter (Signed)
Pt returned call. Kathleen W Perdue  

## 2012-05-08 NOTE — Telephone Encounter (Signed)
lmomtcb x1 for pt 

## 2012-05-11 NOTE — Telephone Encounter (Signed)
I spoke with pt and is aware will schedule appt in November. I have placed reminder in epic and to my self.

## 2012-05-22 ENCOUNTER — Emergency Department (INDEPENDENT_AMBULATORY_CARE_PROVIDER_SITE_OTHER): Payer: Medicare Other

## 2012-05-22 ENCOUNTER — Emergency Department (HOSPITAL_COMMUNITY)
Admission: EM | Admit: 2012-05-22 | Discharge: 2012-05-22 | Disposition: A | Discharge status: Home / Self Care | Payer: Medicare Other | Source: Home / Self Care | Attending: Emergency Medicine | Admitting: Emergency Medicine

## 2012-05-22 ENCOUNTER — Encounter (HOSPITAL_COMMUNITY): Payer: Self-pay | Admitting: Emergency Medicine

## 2012-05-22 DIAGNOSIS — S93409A Sprain of unspecified ligament of unspecified ankle, initial encounter: Secondary | ICD-10-CM

## 2012-05-22 DIAGNOSIS — S93401A Sprain of unspecified ligament of right ankle, initial encounter: Secondary | ICD-10-CM

## 2012-05-22 HISTORY — DX: Other fracture of right lower leg, initial encounter for closed fracture: S82.891A

## 2012-05-22 MED ORDER — IBUPROFEN 600 MG PO TABS: 600.0000 mg | ORAL_TABLET | Freq: Four times a day (QID) | ORAL | Status: AC | PRN

## 2012-05-22 MED ORDER — HYDROCODONE-ACETAMINOPHEN 5-325 MG PO TABS: 2.0000 | ORAL_TABLET | ORAL | Status: AC | PRN

## 2012-05-22 NOTE — ED Notes (Signed)
Patient returned from xray.

## 2012-05-22 NOTE — ED Provider Notes (Signed)
History     CSN: [id number]  Arrival date & time 05/22/12  1205   First MD Initiated Contact with Patient 05/22/12 1247      Chief Complaint  Patient presents with  . Ankle Pain    (Consider location/radiation/quality/duration/timing/severity/associated sxs/prior treatment) HPI Comments: Patient states that he slipped on an area red, and rolled his foot forward and outward last night. Did not hear a "pop". Was able to bear weight on it medially afterwards, but states it is more painful today and is using a cane for ambulation. Reports pain, swelling, bruising along lateral aspect of his ankle. No numbness, weakness, fevers, gross deformity. History of right ankle fracture, managed nonoperatively. Pt is a diabetic, smoker.  ROS as noted in HPI. All other ROS negative.   Patient is a 41 y.o. male presenting with ankle pain. The history is provided by the patient.  Ankle Pain  The incident occurred yesterday. The incident occurred at home. The injury mechanism was a fall. The pain is present in the right ankle. The quality of the pain is described as aching and throbbing. The pain has been constant since onset. Associated symptoms include inability to bear weight. Pertinent negatives include no numbness, no loss of motion, no muscle weakness, no loss of sensation and no tingling. He reports no foreign bodies present. The symptoms are aggravated by activity, bearing weight and palpation. He has tried ice for the symptoms. The treatment provided mild relief.    Past Medical History  Diagnosis Date  . Diabetes mellitus   . Obesity   . Hypertension   . Bipolar affective disorder   . Sleep apnea   . Gout   . Hyperlipidemia     Per pt - reported on 03/07/12  . Ankle fracture, right     Past Surgical History  Procedure Date  . Keloid excision   . Vasectomy     Family History  Problem Relation Age of Onset  . Hypertension Mother   . Diabetes Mother   . Hyperlipidemia Mother   .  Throat cancer      aunt  . Kidney failure    . Pancreatitis Father   . Arthritis Father   . Heart attack Brother     35  . Heart disease Brother     MI  . Heart attack Maternal Grandmother   . Cancer Maternal Grandmother     breast  . Kidney disease Sister     dialysis  . Lupus Sister   . Stroke Sister   . Diabetes Maternal Aunt   . Heart disease Maternal Aunt   . Diabetes Paternal Aunt   . Heart disease Paternal Aunt     pacemaker  . Heart disease Paternal Uncle     pacemaker  . Diabetes Paternal Grandmother   . Stroke Paternal Aunt   . COPD Maternal Aunt   . Heart disease Maternal Aunt     History  Substance Use Topics  . Smoking status: Current Everyday Smoker -- 0.5 packs/day for 25 years    Types: Cigarettes  . Smokeless tobacco: Not on file  . Alcohol Use: No      Review of Systems  Neurological: Negative for tingling and numbness.    Allergies  Review of patient's allergies indicates no known allergies.  Home Medications   Current Outpatient Rx  Name Route Sig Dispense Refill  . ATORVASTATIN CALCIUM 20 MG PO TABS Oral Take 1 tablet (20 mg total) by mouth daily. 30  tablet 2  . FREESTYLE SYSTEM KIT Does not apply 1 each by Does not apply route as needed for other. 1 each   . HYDROCODONE-ACETAMINOPHEN 5-325 MG PO TABS Oral Take 2 tablets by mouth every 4 (four) hours as needed for pain. 20 tablet 0  . IBUPROFEN 600 MG PO TABS Oral Take 1 tablet (600 mg total) by mouth every 6 (six) hours as needed for pain. 30 tablet 0  . LISINOPRIL 20 MG PO TABS Oral Take 1 tablet (20 mg total) by mouth daily. 90 tablet 3  . METFORMIN HCL ER 500 MG PO TB24 Oral Take 2 tablets (1,000 mg total) by mouth daily with breakfast. 60 tablet 2    BP 142/87  Pulse 78  Temp 97.6 F (36.4 C) (Oral)  Resp 22  SpO2 98%  Physical Exam  Nursing note and vitals reviewed. Constitutional: He is oriented to person, place, and time. He appears well-developed and well-nourished.    HENT:  Head: Normocephalic and atraumatic.  Eyes: Conjunctivae and EOM are normal.  Neck: Normal range of motion.  Cardiovascular: Normal rate.   Pulmonary/Chest: Effort normal. No respiratory distress.  Abdominal: He exhibits no distension.  Musculoskeletal:       Right ankle: He exhibits decreased range of motion, swelling and ecchymosis. He exhibits no deformity and normal pulse. tenderness. Lateral malleolus tenderness found. No head of 5th metatarsal and no proximal fibula tenderness found. Achilles tendon normal.       Tenderness, swelling along lateral ligaments. Deltoid ligaments NT. Pain with ankle inversion  Neurological: He is alert and oriented to person, place, and time. Coordination normal.  Skin: Skin is warm and dry.  Psychiatric: He has a normal mood and affect. His behavior is normal. Judgment and thought content normal.    ED Course  Procedures (including critical care time)  Labs Reviewed - No data to display Dg Ankle Complete Right  05/22/2012  *RADIOLOGY REPORT*  Clinical Data: Right-sided ankle pain and swelling.  RIGHT ANKLE - COMPLETE 3+ VIEW  Comparison: 05/18/2009.  Findings: Three views of the right ankle demonstrate extensive soft tissue swelling.  No acute displaced fracture, subluxation or dislocation is appreciated.  Degenerative changes of osteoarthritis are noted in the hind foot.  IMPRESSION: 1.  Extensive soft tissue swelling overlying the right ankle, without acute bony abnormality.   Original Report Authenticated By: Florencia Reasons, M.D.      1. Right ankle sprain     MDM    Imaging reviewed by myself. Report per radiologist. Discussed results with patient. Applied ASO, will have him continue using his cane,, instructed pt on ice, nsaid/ norco prn, and f/u with [facility] sports medicine clinic in 10 days if no improvement.   Luiz Blare, MD 05/22/12 [phone]

## 2012-05-22 NOTE — ED Notes (Signed)
Patient slipped on an area rug and wood floor.  Incident occurred last night.  Pain in ankle whether standing or attempting weight bearing.  Pain in lateral ankle, right ankle.

## 2012-05-24 ENCOUNTER — Telehealth: Payer: Self-pay | Admitting: Family Medicine

## 2012-05-24 NOTE — Telephone Encounter (Signed)
Pt states he needs [place] to send another form to the [facility] stating that his cpap machine will not arrive until 05/29/12. Ph # DMV Medical Review Board  [phone]. Fax # [phone].

## 2012-05-24 NOTE — Telephone Encounter (Signed)
Ok to send note.

## 2012-05-24 NOTE — Telephone Encounter (Signed)
Note sent    KP 

## 2012-06-06 ENCOUNTER — Telehealth: Payer: Self-pay | Admitting: *Deleted

## 2012-06-06 NOTE — Telephone Encounter (Signed)
Needs follow up in November on cpap   I spoke with pt and is scheduled to come in 08/07/12 at 1:45. Nothing further was needed

## 2012-07-09 ENCOUNTER — Encounter: Payer: Self-pay | Admitting: Family Medicine

## 2012-07-09 ENCOUNTER — Telehealth: Payer: Self-pay | Admitting: Family Medicine

## 2012-07-09 NOTE — Telephone Encounter (Signed)
Letter done

## 2012-07-09 NOTE — Telephone Encounter (Signed)
Discussed with pt. He states that the psychiatric office he went to previously no longer offers those service. I gave the pt 3 psychiatric offices that Dr. Laury Axon recommended & he states that he will contact them. Pt states that he does need a letter faxed to the [facility] explaining the delay in paper work. The paper work needs to be faxed to [phone].   DMV phone #: [phone].

## 2012-07-09 NOTE — Telephone Encounter (Signed)
Patient states the DMV is still saying they have not received his forms and that his license is going to be suspended. The patient was unable to articulate exactly what forms they are needing. Pt has new phone # [phone]

## 2012-07-09 NOTE — Telephone Encounter (Signed)
I'm waiting for his psych eval to finish paperwork

## 2012-07-09 NOTE — Telephone Encounter (Signed)
I've been waiting for psych eval----  We need that to finish filling out paperwork.

## 2012-07-09 NOTE — Telephone Encounter (Signed)
Are you familiar with forms pt is referring to? Please advise.

## 2012-08-07 ENCOUNTER — Ambulatory Visit: Payer: Medicare Other | Admitting: Pulmonary Disease

## 2012-09-17 ENCOUNTER — Ambulatory Visit: Payer: Medicare Other | Admitting: Pulmonary Disease

## 2012-10-01 ENCOUNTER — Telehealth: Payer: Self-pay | Admitting: Family Medicine

## 2012-10-01 NOTE — Telephone Encounter (Signed)
Please advise      KP 

## 2012-10-01 NOTE — Telephone Encounter (Signed)
pt needs extension sent to [facility] to continue to keep his job--please advise [phone], pt is due to follow up with Pulmonary 1.17.14

## 2012-10-02 NOTE — Telephone Encounter (Signed)
Pt needs to stop cancelling pulm appointments.  We will do it one more time.  He has to see pulmonary.

## 2012-10-03 NOTE — Telephone Encounter (Signed)
Correction letter faxed to [phone]    KP

## 2012-10-03 NOTE — Telephone Encounter (Signed)
discussed with patient and he voiced understanding-- letter mailed      KP

## 2012-10-12 ENCOUNTER — Encounter: Payer: Self-pay | Admitting: Pulmonary Disease

## 2012-10-12 ENCOUNTER — Ambulatory Visit (INDEPENDENT_AMBULATORY_CARE_PROVIDER_SITE_OTHER): Payer: Medicare Other | Admitting: Pulmonary Disease

## 2012-10-12 VITALS — BP 136/72 | HR 78 | Temp 98.3°F | Ht 75.0 in | Wt 399.6 lb

## 2012-10-12 DIAGNOSIS — G4733 Obstructive sleep apnea (adult) (pediatric): Secondary | ICD-10-CM

## 2012-10-12 NOTE — Progress Notes (Signed)
Chief Complaint  Patient presents with  . Follow-up    pt states he wears CPAP everynight except when he has a cold. dneies any problems w/ mask/mahcine. sleeps well at night. feels rested during the day    History of Present Illness: [name] is a 42 y.o. male with OSA.  He has been doing well with CPAP.  He uses his machine every night.  He has a full face mask.  He sleeps through the night, and gets about 7 hours sleep per night.  TESTS: PSG 07/20/06>>RDI 65 PSG 04/17/12>>AHI 71, SpO2 low 78%. CPAP 18 cm H2O>>+R, +S.   Past Medical History  Diagnosis Date  . Diabetes mellitus   . Obesity   . Hypertension   . Bipolar affective disorder   . Sleep apnea   . Gout   . Hyperlipidemia     Per pt - reported on 03/07/12  . Ankle fracture, right     Past Surgical History  Procedure Date  . Keloid excision   . Vasectomy     Outpatient Encounter Prescriptions as of 10/12/2012  Medication Sig Dispense Refill  . atorvastatin (LIPITOR) 20 MG tablet Take 1 tablet (20 mg total) by mouth daily.  30 tablet  2  . glucose monitoring kit (FREESTYLE) monitoring kit 1 each by Does not apply route as needed for other.  1 each    . lisinopril (PRINIVIL,ZESTRIL) 20 MG tablet Take 1 tablet (20 mg total) by mouth daily.  90 tablet  3  . metFORMIN (GLUCOPHAGE XR) 500 MG 24 hr tablet Take 2 tablets (1,000 mg total) by mouth daily with breakfast.  60 tablet  2    No Known Allergies  Physical Exam:  Filed Vitals:   10/12/12 1606 10/12/12 1607  BP:  136/72  Pulse:  78  Temp: 98.3 F (36.8 C)   TempSrc: Oral   Height: 6\' 3"  (1.905 m)   Weight: 399 lb 9.6 oz (181.257 kg)   SpO2:  95%     Current Encounter SPO2  10/12/12 1607 95%  05/22/12 1321 98%  03/28/12 1427 95%     Body mass index is 49.95 kg/(m^2).   Wt Readings from Last 2 Encounters:  10/12/12 399 lb 9.6 oz (181.257 kg)  04/17/12 404 lb (183.253 kg)     General - No distress ENT - No sinus tenderness, no oral  exudate, MP 4, no LAN Cardiac - s1s2 regular, no murmur Chest - No wheeze/rales/dullness Back - No focal tenderness Abd - Soft, non-tender Ext - No edema Neuro - Normal strength Skin - No rashes Psych - normal mood, and behavior   Assessment/Plan:  Coralyn Helling, MD [place] [facility]monary/Critical Care/Sleep Pager:  [phone] 10/12/2012, 4:12 PM

## 2012-10-12 NOTE — Patient Instructions (Signed)
Will get copy of CPAP report and call with results Follow up in one year

## 2012-10-12 NOTE — Assessment & Plan Note (Signed)
He reports compliance with therapy, and benefit from CPAP.  Will get his download and call him with results.

## 2012-11-15 ENCOUNTER — Emergency Department (HOSPITAL_COMMUNITY)
Admission: EM | Admit: 2012-11-15 | Discharge: 2012-11-15 | Disposition: A | Discharge status: Home / Self Care | Payer: Medicare Other | Attending: Emergency Medicine | Admitting: Emergency Medicine

## 2012-11-15 ENCOUNTER — Encounter (HOSPITAL_COMMUNITY): Payer: Self-pay | Admitting: Emergency Medicine

## 2012-11-15 DIAGNOSIS — E669 Obesity, unspecified: Secondary | ICD-10-CM | POA: Insufficient documentation

## 2012-11-15 DIAGNOSIS — M25519 Pain in unspecified shoulder: Secondary | ICD-10-CM | POA: Insufficient documentation

## 2012-11-15 DIAGNOSIS — M791 Myalgia, unspecified site: Secondary | ICD-10-CM

## 2012-11-15 DIAGNOSIS — E785 Hyperlipidemia, unspecified: Secondary | ICD-10-CM | POA: Insufficient documentation

## 2012-11-15 DIAGNOSIS — I10 Essential (primary) hypertension: Secondary | ICD-10-CM | POA: Insufficient documentation

## 2012-11-15 DIAGNOSIS — F319 Bipolar disorder, unspecified: Secondary | ICD-10-CM | POA: Insufficient documentation

## 2012-11-15 DIAGNOSIS — Z8639 Personal history of other endocrine, nutritional and metabolic disease: Secondary | ICD-10-CM | POA: Insufficient documentation

## 2012-11-15 DIAGNOSIS — Z79899 Other long term (current) drug therapy: Secondary | ICD-10-CM | POA: Insufficient documentation

## 2012-11-15 DIAGNOSIS — F17200 Nicotine dependence, unspecified, uncomplicated: Secondary | ICD-10-CM | POA: Insufficient documentation

## 2012-11-15 DIAGNOSIS — Z862 Personal history of diseases of the blood and blood-forming organs and certain disorders involving the immune mechanism: Secondary | ICD-10-CM | POA: Insufficient documentation

## 2012-11-15 DIAGNOSIS — E119 Type 2 diabetes mellitus without complications: Secondary | ICD-10-CM | POA: Insufficient documentation

## 2012-11-15 LAB — GLUCOSE, CAPILLARY: Glucose-Capillary: 188 mg/dL — ABNORMAL HIGH (ref 70–99)

## 2012-11-15 MED ORDER — CYCLOBENZAPRINE HCL 10 MG PO TABS: 10.0000 mg | ORAL_TABLET | Freq: Three times a day (TID) | ORAL | Status: DC | PRN

## 2012-11-15 MED ORDER — KETOROLAC TROMETHAMINE 60 MG/2ML IM SOLN
60.0000 mg | Freq: Once | INTRAMUSCULAR | Status: AC
  Administered 2012-11-15: 60 mg via INTRAMUSCULAR
  Filled 2012-11-15: qty 2

## 2012-11-15 MED ORDER — PERCOCET 5-325 MG PO TABS: 1.0000 | ORAL_TABLET | Freq: Four times a day (QID) | ORAL | Status: DC | PRN

## 2012-11-15 MED ORDER — NAPROXEN 375 MG PO TABS: 375.0000 mg | ORAL_TABLET | Freq: Two times a day (BID) | ORAL | Status: DC

## 2012-11-15 MED ORDER — OXYCODONE-ACETAMINOPHEN 5-325 MG PO TABS
2.0000 | ORAL_TABLET | Freq: Once | ORAL | Status: AC
  Administered 2012-11-15: 2 via ORAL
  Filled 2012-11-15: qty 2

## 2012-11-15 NOTE — ED Provider Notes (Signed)
Medical screening examination/treatment/procedure(s) were performed by non-physician practitioner and as supervising physician I was immediately available for consultation/collaboration.  Jasmine Awe, MD 11/15/12 [phone]

## 2012-11-15 NOTE — ED Provider Notes (Signed)
History     CSN: [id number]  Arrival date & time 11/15/12  0155   First MD Initiated Contact with Patient 11/15/12 0319      Chief Complaint  Patient presents with  . Back Pain    (Consider location/radiation/quality/duration/timing/severity/associated sxs/prior treatment) Patient is a 42 y.o. male presenting with shoulder pain. The history is provided by the patient.  Shoulder Pain This is a new problem. Episode onset: 3 days. The problem occurs constantly. The problem has been unchanged. Associated symptoms include arthralgias. Pertinent negatives include no abdominal pain, anorexia, change in bowel habit, chest pain, chills, congestion, coughing, diaphoresis, fatigue, fever, headaches, joint swelling, myalgias, nausea, neck pain, numbness, rash, sore throat, swollen glands, urinary symptoms, vertigo, visual change, vomiting or weakness. Exacerbated by: use of RUE. He has tried NSAIDs for the symptoms. The treatment provided no relief.    Past Medical History  Diagnosis Date  . Diabetes mellitus   . Obesity   . Hypertension   . Bipolar affective disorder   . Sleep apnea   . Gout   . Hyperlipidemia     Per pt - reported on 03/07/12  . Ankle fracture, right     Past Surgical History  Procedure Laterality Date  . Keloid excision    . Vasectomy      Family History  Problem Relation Age of Onset  . Hypertension Mother   . Diabetes Mother   . Hyperlipidemia Mother   . Throat cancer      aunt  . Kidney failure    . Pancreatitis Father   . Arthritis Father   . Heart attack Brother     35  . Heart disease Brother     MI  . Heart attack Maternal Grandmother   . Cancer Maternal Grandmother     breast  . Kidney disease Sister     dialysis  . Lupus Sister   . Stroke Sister   . Diabetes Maternal Aunt   . Heart disease Maternal Aunt   . Diabetes Paternal Aunt   . Heart disease Paternal Aunt     pacemaker  . Heart disease Paternal Uncle     pacemaker  . Diabetes  Paternal Grandmother   . Stroke Paternal Aunt   . COPD Maternal Aunt   . Heart disease Maternal Aunt     History  Substance Use Topics  . Smoking status: Current Every Day Smoker -- 0.50 packs/day for 25 years    Types: Cigarettes  . Smokeless tobacco: Not on file  . Alcohol Use: No      Review of Systems  Constitutional: Negative for fever, chills, diaphoresis and fatigue.  HENT: Negative for congestion, sore throat and neck pain.   Respiratory: Negative for cough.   Cardiovascular: Negative for chest pain.  Gastrointestinal: Negative for nausea, vomiting, abdominal pain, anorexia and change in bowel habit.  Musculoskeletal: Positive for arthralgias. Negative for myalgias and joint swelling.  Skin: Negative for rash.  Neurological: Negative for vertigo, weakness, numbness and headaches.  All other systems reviewed and are negative.    Allergies  Review of patient's allergies indicates no known allergies.  Home Medications   Current Outpatient Rx  Name  Route  Sig  Dispense  Refill  . atorvastatin (LIPITOR) 20 MG tablet   Oral   Take 1 tablet (20 mg total) by mouth daily.   30 tablet   2   . lisinopril (PRINIVIL,ZESTRIL) 20 MG tablet   Oral   Take 1 tablet (  20 mg total) by mouth daily.   90 tablet   3   . glucose monitoring kit (FREESTYLE) monitoring kit   Does not apply   1 each by Does not apply route as needed for other.   1 each        BP 141/89  Pulse 87  Temp(Src) 98.1 F (36.7 C) (Oral)  Resp 20  SpO2 96%  Physical Exam  Nursing note and vitals reviewed. Constitutional: He appears well-developed and well-nourished. No distress.  HENT:  Head: Normocephalic and atraumatic.  Eyes: Conjunctivae and EOM are normal.  Neck: Normal range of motion. Neck supple.  No cervical ttp. Normal ROM  Cardiovascular:  Intact distal pulses, capillary refill < 3 seconds  Musculoskeletal:  Right posterior shoulder w muscular ttp around scapula. Normal ROM.  All other extremities with normal ROM  Neurological:  No sensory deficit  Skin: He is not diaphoretic.  Skin intact, no tenting    ED Course  Procedures (including critical care time)  Labs Reviewed  GLUCOSE, CAPILLARY - Abnormal; Notable for the following:    Glucose-Capillary 188 (*)    All other components within normal limits  GLUCOSE, CAPILLARY - Abnormal; Notable for the following:    Glucose-Capillary 185 (*)    All other components within normal limits   No results found.   No diagnosis found.    MDM  Muscular tenderness, right shoulder Pt to ER c/o muscular shoulder tenderness. Normal ROM on exam and neurovascularly intact. No imaging indicated at this time. Pain managed in ED. Pt advised to follow up with orthopedics if symptoms persist. Conservative therapy recommended and discussed. Patient will be dc home & is agreeable with above plan.         Jaci Carrel, [place] 11/15/12 [phone]

## 2012-11-15 NOTE — ED Notes (Addendum)
C/o pain and redness to R shoulder blade x 3 days. No known injury.

## 2012-11-15 NOTE — ED Notes (Signed)
Pt states that for the past 3 days he has been experiencing pain around his R shoulder blade.  Swelling and redness noted at site.  Site is tender to touch.  No known trauma to cause pain.

## 2012-12-17 ENCOUNTER — Telehealth: Payer: Self-pay | Admitting: Family Medicine

## 2012-12-17 NOTE — Telephone Encounter (Signed)
Patient states that he dropped DMV forms off last month to be filled out and now the [facility] is stating that they never received forms from [place].

## 2012-12-17 NOTE — Telephone Encounter (Signed)
I know kim faxed them--- can we fax them again ?  They should have been scanned.

## 2012-12-18 NOTE — Telephone Encounter (Signed)
Forms printed and refaxed

## 2012-12-20 ENCOUNTER — Encounter: Payer: Self-pay | Admitting: Family Medicine

## 2012-12-20 ENCOUNTER — Ambulatory Visit (INDEPENDENT_AMBULATORY_CARE_PROVIDER_SITE_OTHER): Payer: Medicare Other | Admitting: Family Medicine

## 2012-12-20 VITALS — BP 136/76 | HR 68 | Temp 97.9°F | Wt >= 6400 oz

## 2012-12-20 DIAGNOSIS — E119 Type 2 diabetes mellitus without complications: Secondary | ICD-10-CM

## 2012-12-20 DIAGNOSIS — L0292 Furuncle, unspecified: Secondary | ICD-10-CM

## 2012-12-20 DIAGNOSIS — E785 Hyperlipidemia, unspecified: Secondary | ICD-10-CM

## 2012-12-20 DIAGNOSIS — R319 Hematuria, unspecified: Secondary | ICD-10-CM

## 2012-12-20 DIAGNOSIS — N529 Male erectile dysfunction, unspecified: Secondary | ICD-10-CM

## 2012-12-20 DIAGNOSIS — I10 Essential (primary) hypertension: Secondary | ICD-10-CM | POA: Insufficient documentation

## 2012-12-20 DIAGNOSIS — L0293 Carbuncle, unspecified: Secondary | ICD-10-CM

## 2012-12-20 LAB — LIPID PANEL
HDL: 19.5 mg/dL — ABNORMAL LOW (ref >39.00)
Triglycerides: 455 mg/dL — ABNORMAL HIGH (ref 0.0–149.0)
VLDL: 91 mg/dL — ABNORMAL HIGH (ref 0.0–40.0)

## 2012-12-20 LAB — POCT URINALYSIS DIPSTICK
Bilirubin, UA: NEGATIVE
Glucose, UA: NEGATIVE
Ketones, UA: NEGATIVE
Nitrite, UA: NEGATIVE
pH, UA: 6

## 2012-12-20 LAB — BASIC METABOLIC PANEL
CO2: 27 mEq/L (ref 19–32)
Calcium: 8.6 mg/dL (ref 8.4–10.5)
Creatinine, Ser: 1 mg/dL (ref 0.4–1.5)
GFR: 88.55 mL/min (ref >60.00)
Glucose, Bld: 142 mg/dL — ABNORMAL HIGH (ref 70–99)
Sodium: 134 mEq/L — ABNORMAL LOW (ref 135–145)

## 2012-12-20 LAB — HEPATIC FUNCTION PANEL
Albumin: 3.4 g/dL — ABNORMAL LOW (ref 3.5–5.2)
Alkaline Phosphatase: 71 U/L (ref 39–117)
Bilirubin, Direct: 0 mg/dL (ref 0.0–0.3)
Total Protein: 7.6 g/dL (ref 6.0–8.3)

## 2012-12-20 LAB — LDL CHOLESTEROL, DIRECT: Direct LDL: 129.2 mg/dL

## 2012-12-20 MED ORDER — DOXYCYCLINE HYCLATE 100 MG PO TABS: 100.0000 mg | ORAL_TABLET | Freq: Two times a day (BID) | ORAL | Status: DC

## 2012-12-20 MED ORDER — SILDENAFIL CITRATE 100 MG PO TABS: 50.0000 mg | ORAL_TABLET | Freq: Every day | ORAL | Status: DC | PRN

## 2012-12-20 MED ORDER — ATORVASTATIN CALCIUM 20 MG PO TABS: 20.0000 mg | ORAL_TABLET | Freq: Every day | ORAL | Status: DC

## 2012-12-20 MED ORDER — LISINOPRIL 20 MG PO TABS: 20.0000 mg | ORAL_TABLET | Freq: Every day | ORAL | Status: DC

## 2012-12-20 MED ORDER — CHLORHEXIDINE GLUCONATE 4 % EX LIQD: 60.0000 mL | Freq: Every day | CUTANEOUS | Status: DC | PRN

## 2012-12-20 NOTE — Progress Notes (Signed)
  Subjective:    Patient ID: [name], male    DOB: [birth year], 42 y.o.   MRN: [id number]  HPI HYPERTENSION Disease Monitoring Blood pressure range-not checking at home Chest pain--no      Dyspnea- no Medications Compliance- good Lightheadedness- no   Edema- no   DIABETES Disease Monitoring Blood Sugar ranges-60-75 Polyuria- no New Visual problems- no Medications Compliance- good Hypoglycemic symptoms- no   HYPERLIPIDEMIA Disease Monitoring See symptoms for Hypertension Medications Compliance- good RUQ pain- no  Muscle aches- no  Pt also c/o boils all over his body --esp in skin folds and axilla--- they are reoccuring and he c/o ED coming back.  No other symptoms. ROS See HPI above   PMH Smoking Status noted     Review of Systems    as above  Objective:   Physical Exam  BP 136/76  Pulse 68  Temp(Src) 97.9 F (36.6 C) (Oral)  Wt 400 lb (181.439 kg)  BMI 50 kg/m2  SpO2 94% General appearance: alert, cooperative, appears stated age and no distress Neck: no adenopathy, supple, symmetrical, trachea midline and thyroid not enlarged, symmetric, no tenderness/mass/nodules Lungs: clear to auscultation bilaterally Heart: S1, S2 normal Extremities: extremities normal, atraumatic, no cyanosis or edema Skin: mult boils in folds of skin and axilla b/l  Sensory exam of the foot is normal, tested with the monofilament. Good pulses, no lesions or ulcers, good peripheral pulses.      Assessment & Plan:

## 2012-12-20 NOTE — Assessment & Plan Note (Signed)
Check labs con't meds 

## 2012-12-20 NOTE — Patient Instructions (Signed)

## 2012-12-20 NOTE — Assessment & Plan Note (Signed)
viagra Check testosterone

## 2012-12-20 NOTE — Assessment & Plan Note (Signed)
Diet controlled.  

## 2012-12-20 NOTE — Assessment & Plan Note (Signed)
Stable con't meds 

## 2012-12-20 NOTE — Assessment & Plan Note (Signed)
See orders for abx hibiclens rto prn

## 2012-12-21 LAB — TESTOSTERONE, FREE, TOTAL, SHBG
Sex Hormone Binding: 30 nmol/L (ref 13–71)
Testosterone, Free: 46.2 pg/mL — ABNORMAL LOW (ref 47.0–244.0)
Testosterone-% Free: 2 % (ref 1.6–2.9)

## 2012-12-22 LAB — URINE CULTURE: Organism ID, Bacteria: NO GROWTH

## 2012-12-26 DIAGNOSIS — E785 Hyperlipidemia, unspecified: Secondary | ICD-10-CM

## 2012-12-26 MED ORDER — ATORVASTATIN CALCIUM 40 MG PO TABS: 40.0000 mg | ORAL_TABLET | Freq: Every day | ORAL | Status: DC

## 2012-12-26 MED ORDER — FENOFIBRATE 160 MG PO TABS: 160.0000 mg | ORAL_TABLET | Freq: Every day | ORAL | Status: DC

## 2012-12-26 MED ORDER — METFORMIN HCL ER 500 MG PO TB24: 1000.0000 mg | ORAL_TABLET | Freq: Every evening | ORAL | Status: DC

## 2013-01-01 ENCOUNTER — Telehealth: Payer: Self-pay | Admitting: *Deleted

## 2013-01-01 DIAGNOSIS — R7989 Other specified abnormal findings of blood chemistry: Secondary | ICD-10-CM

## 2013-01-01 NOTE — Telephone Encounter (Signed)
Discuss with patient  

## 2013-01-01 NOTE — Telephone Encounter (Signed)
Pt states that he received his labs in the mail an d his Testosterone was low at 228. Pt would like to know what he needs to do about this.Please advise

## 2013-01-01 NOTE — Telephone Encounter (Signed)
Refer to urology.  ?

## 2013-01-04 ENCOUNTER — Telehealth: Payer: Self-pay | Admitting: Family Medicine

## 2013-01-04 NOTE — Telephone Encounter (Signed)
Message forward to referral coordinator.

## 2013-01-04 NOTE — Telephone Encounter (Signed)
Caller: Tyrie/Patient; Phone: [phone]; Reason for Call: Dr.  Ernst Spell nurse, Luster Landsberg called this pt and he is calling back.  [phone] is the best # to reach him at.

## 2013-01-04 NOTE — Telephone Encounter (Signed)
I returned patient's call. 

## 2013-01-10 ENCOUNTER — Encounter: Payer: Self-pay | Admitting: Family Medicine

## 2013-01-16 ENCOUNTER — Emergency Department (HOSPITAL_COMMUNITY): Payer: Medicare Other

## 2013-01-16 ENCOUNTER — Encounter (HOSPITAL_COMMUNITY): Payer: Self-pay | Admitting: Emergency Medicine

## 2013-01-16 ENCOUNTER — Emergency Department (HOSPITAL_COMMUNITY)
Admission: EM | Admit: 2013-01-16 | Discharge: 2013-01-16 | Disposition: A | Discharge status: Home / Self Care | Payer: Medicare Other | Attending: Emergency Medicine | Admitting: Emergency Medicine

## 2013-01-16 DIAGNOSIS — E669 Obesity, unspecified: Secondary | ICD-10-CM | POA: Insufficient documentation

## 2013-01-16 DIAGNOSIS — I10 Essential (primary) hypertension: Secondary | ICD-10-CM | POA: Insufficient documentation

## 2013-01-16 DIAGNOSIS — Y9289 Other specified places as the place of occurrence of the external cause: Secondary | ICD-10-CM | POA: Insufficient documentation

## 2013-01-16 DIAGNOSIS — E785 Hyperlipidemia, unspecified: Secondary | ICD-10-CM | POA: Insufficient documentation

## 2013-01-16 DIAGNOSIS — Z79899 Other long term (current) drug therapy: Secondary | ICD-10-CM | POA: Insufficient documentation

## 2013-01-16 DIAGNOSIS — Y9389 Activity, other specified: Secondary | ICD-10-CM | POA: Insufficient documentation

## 2013-01-16 DIAGNOSIS — F17200 Nicotine dependence, unspecified, uncomplicated: Secondary | ICD-10-CM | POA: Insufficient documentation

## 2013-01-16 DIAGNOSIS — Z8781 Personal history of (healed) traumatic fracture: Secondary | ICD-10-CM | POA: Insufficient documentation

## 2013-01-16 DIAGNOSIS — G4730 Sleep apnea, unspecified: Secondary | ICD-10-CM | POA: Insufficient documentation

## 2013-01-16 DIAGNOSIS — W010XXA Fall on same level from slipping, tripping and stumbling without subsequent striking against object, initial encounter: Secondary | ICD-10-CM | POA: Insufficient documentation

## 2013-01-16 DIAGNOSIS — M109 Gout, unspecified: Secondary | ICD-10-CM | POA: Insufficient documentation

## 2013-01-16 DIAGNOSIS — W108XXA Fall (on) (from) other stairs and steps, initial encounter: Secondary | ICD-10-CM | POA: Insufficient documentation

## 2013-01-16 DIAGNOSIS — F319 Bipolar disorder, unspecified: Secondary | ICD-10-CM | POA: Insufficient documentation

## 2013-01-16 DIAGNOSIS — S99929A Unspecified injury of unspecified foot, initial encounter: Secondary | ICD-10-CM | POA: Insufficient documentation

## 2013-01-16 DIAGNOSIS — M25561 Pain in right knee: Secondary | ICD-10-CM

## 2013-01-16 DIAGNOSIS — E119 Type 2 diabetes mellitus without complications: Secondary | ICD-10-CM | POA: Insufficient documentation

## 2013-01-16 DIAGNOSIS — S8990XA Unspecified injury of unspecified lower leg, initial encounter: Secondary | ICD-10-CM | POA: Insufficient documentation

## 2013-01-16 DIAGNOSIS — R52 Pain, unspecified: Secondary | ICD-10-CM | POA: Insufficient documentation

## 2013-01-16 MED ORDER — OXYCODONE-ACETAMINOPHEN 5-325 MG PO TABS: 1.0000 | ORAL_TABLET | ORAL | Status: DC | PRN

## 2013-01-16 MED ORDER — DIAZEPAM 5 MG PO TABS: 5.0000 mg | ORAL_TABLET | Freq: Two times a day (BID) | ORAL | Status: DC

## 2013-01-16 MED ORDER — HYDROMORPHONE HCL PF 1 MG/ML IJ SOLN
1.0000 mg | Freq: Once | INTRAMUSCULAR | Status: AC
  Administered 2013-01-16: 1 mg via INTRAVENOUS
  Filled 2013-01-16: qty 1

## 2013-01-16 MED ORDER — IBUPROFEN 800 MG PO TABS: 800.0000 mg | ORAL_TABLET | Freq: Three times a day (TID) | ORAL | Status: DC

## 2013-01-16 NOTE — ED Provider Notes (Signed)
Medical screening examination/treatment/procedure(s) were performed by non-physician practitioner and as supervising physician I was immediately available for consultation/collaboration.   Loren Racer, MD 01/16/13 [phone]

## 2013-01-16 NOTE — ED Notes (Signed)
Pt c/o right knee pain after falling down stairs this am; pt sts painful to move or bear weight

## 2013-01-16 NOTE — ED Provider Notes (Signed)
History    This chart was scribed for non-physician practitioner Glade Nurse, PA-C working with Loren Racer, MD by Gerlean Ren, ED Scribe. This patient was seen in room TR06C/TR06C and the patient's care was started at 5:01 PM.    CSN: [id number]  Arrival date & time 01/16/13  1516   None     Chief Complaint  Patient presents with  . Knee Pain     The history is provided by the patient. No language interpreter was used.  [name] is a 42 y.o. male who presents to the Emergency Department complaining of constant right knee pain with sudden onset when the knee locked and gave out on him causing him to fall down an entire flight of stairs this morning.  No further injuries as result of the fall, no head trauma, no LOC.  Pain significantly worsened with movements and when bearing weight.  Pt has used ice with no improvements to pain or swelling.  Pt has h/o DM. Pt reports he has used oxycodone previously with improvements with prior ankle fracture.   PCP is Dr. Laury Axon at [facility] Past Medical History  Diagnosis Date  . Diabetes mellitus   . Obesity   . Hypertension   . Bipolar affective disorder   . Sleep apnea   . Gout   . Hyperlipidemia     Per pt - reported on 03/07/12  . Ankle fracture, right     Past Surgical History  Procedure Laterality Date  . Keloid excision    . Vasectomy      Family History  Problem Relation Age of Onset  . Hypertension Mother   . Diabetes Mother   . Hyperlipidemia Mother   . Throat cancer      aunt  . Kidney failure    . Pancreatitis Father   . Arthritis Father   . Heart attack Brother     35  . Heart disease Brother     MI  . Heart attack Maternal Grandmother   . Cancer Maternal Grandmother     breast  . Kidney disease Sister     dialysis  . Lupus Sister   . Stroke Sister   . Diabetes Maternal Aunt   . Heart disease Maternal Aunt   . Diabetes Paternal Aunt   . Heart disease Paternal Aunt     pacemaker  . Heart disease  Paternal Uncle     pacemaker  . Diabetes Paternal Grandmother   . Stroke Paternal Aunt   . COPD Maternal Aunt   . Heart disease Maternal Aunt     History  Substance Use Topics  . Smoking status: Current Every Day Smoker -- 0.50 packs/day for 25 years    Types: Cigarettes  . Smokeless tobacco: Not on file  . Alcohol Use: No      Review of Systems  Constitutional: Negative for fever and diaphoresis.  HENT: Negative for neck pain and neck stiffness.   Eyes: Negative for visual disturbance.  Respiratory: Negative for apnea, chest tightness and shortness of breath.   Cardiovascular: Negative for chest pain and palpitations.  Gastrointestinal: Negative for nausea, vomiting, diarrhea and constipation.  Genitourinary: Negative for dysuria.  Musculoskeletal: Positive for joint swelling (right knee), arthralgias (right knee) and gait problem.  Skin: Negative for rash.  Neurological: Negative for dizziness, weakness, light-headedness, numbness and headaches.    Allergies  Review of patient's allergies indicates no known allergies.  Home Medications   Current Outpatient Rx  Name  Route  Sig  Dispense  Refill  . atorvastatin (LIPITOR) 40 MG tablet   Oral   Take 1 tablet (40 mg total) by mouth daily.   90 tablet   0   . chlorhexidine (HIBICLENS) 4 % external liquid   Topical   Apply 60 mLs (4 application total) topically daily as needed.   120 mL   0   . cyclobenzaprine (FLEXERIL) 10 MG tablet   Oral   Take 1 tablet (10 mg total) by mouth 3 (three) times daily as needed for muscle spasms.   30 tablet   0   . doxycycline (VIBRA-TABS) 100 MG tablet   Oral   Take 1 tablet (100 mg total) by mouth 2 (two) times daily.   20 tablet   0   . fenofibrate 160 MG tablet   Oral   Take 1 tablet (160 mg total) by mouth daily.   90 tablet   0   . glucose monitoring kit (FREESTYLE) monitoring kit   Does not apply   1 each by Does not apply route as needed for other.   1 each       . lisinopril (PRINIVIL,ZESTRIL) 20 MG tablet   Oral   Take 1 tablet (20 mg total) by mouth daily.   90 tablet   3   . metFORMIN (GLUCOPHAGE XR) 500 MG 24 hr tablet   Oral   Take 2 tablets (1,000 mg total) by mouth every evening.   60 tablet   2   . naproxen (NAPROSYN) 375 MG tablet   Oral   Take 1 tablet (375 mg total) by mouth 2 (two) times daily.   20 tablet   0   . PERCOCET 5-325 MG per tablet   Oral   Take 1 tablet by mouth every 6 (six) hours as needed for pain.   15 tablet   0     Dispense as written.   . sildenafil (VIAGRA) 100 MG tablet   Oral   Take 0.5-1 tablets (50-100 mg total) by mouth daily as needed for erectile dysfunction.   10 tablet   0     BP 144/92  Pulse 81  Temp(Src) 98.7 F (37.1 C) (Oral)  Resp 18  SpO2 96%  Physical Exam  Nursing note and vitals reviewed. Constitutional: He is oriented to person, place, and time. He appears well-developed and well-nourished. No distress.  HENT:  Head: Normocephalic and atraumatic.  Eyes: EOM are normal. Pupils are equal, round, and reactive to light.  Neck: Normal range of motion. Neck supple.  No meningeal signs  Cardiovascular: Normal rate, regular rhythm and normal heart sounds.  Exam reveals no gallop and no friction rub.   No murmur heard. Pulmonary/Chest: Effort normal and breath sounds normal. No respiratory distress. He has no wheezes. He has no rales. He exhibits no tenderness.  Abdominal: Soft. Bowel sounds are normal. He exhibits no distension. There is no tenderness. There is no rebound and no guarding.  Musculoskeletal: Normal range of motion. He exhibits no edema and no tenderness.  5/5 strength throughout. Mild swelling. No erythema. No warmth. No effusion. No joint laxity. Straight leg raise difficult to examine due to body habitus and pt pain.   Neurological: He is alert and oriented to person, place, and time. No cranial nerve deficit.  Skin: Skin is warm and dry. He is not  diaphoretic. No erythema.    ED Course  Procedures (including critical care time) DIAGNOSTIC STUDIES: Oxygen Saturation  is 96% on room air, adequate by my interpretation.    COORDINATION OF CARE: 5:07 PM- Informed pt that there are no fractures found on XR and that further observation and treatment of this injury will be best handled through PCP and possible further orthopedics follow-up.  Discussed knee immobilizer. crutches and pain relief at home with prescription medicine and ice.  Pt verbalizes understanding and agrees with plan.      Dg Knee Complete 4 Views Right  01/16/2013  *RADIOLOGY REPORT*  Clinical Data: Fall.  Pain  RIGHT KNEE - COMPLETE 4+ VIEW  Comparison: None.  Findings: Mild spurring medially and laterally without significant joint space narrowing.  Patella is intact.  There is a joint effusion.  Negative for fracture.  IMPRESSION: Knee joint effusion without fracture.   Original Report Authenticated By: Janeece Riggers, M.D.    Medications  HYDROmorphone (DILAUDID) injection 1 mg (1 mg Intravenous Given 01/16/13 1720)     Discharge Medication List as of 01/16/2013  5:32 PM    START taking these medications   Details  diazepam (VALIUM) 5 MG tablet Take 1 tablet (5 mg total) by mouth 2 (two) times daily., Starting 01/16/2013, Until Discontinued, Print    ibuprofen (ADVIL,MOTRIN) 800 MG tablet Take 1 tablet (800 mg total) by mouth 3 (three) times daily., Starting 01/16/2013, Until Discontinued, Print    oxyCODONE-acetaminophen (PERCOCET) 5-325 MG per tablet Take 1 tablet by mouth every 4 (four) hours as needed for pain., Starting 01/16/2013, Until Discontinued, Print         1. Knee pain, acute, right       MDM  42 y.o. male complaining of constant right knee pain s/p fall down a flight of stairs at home this morning. Swelling, pain, consistent with joint effusion. Xray confirms no fracture of patella. No erythema, no warmth, no deformity, no fever.  Directed pt to ice  injury, take acetaminophen or ibuprofen for pain, and to elevate and rest the injury when possible. Provided knee immobilizer, crutches, and pain meds, with return precaution and follow up instructions. Pt understands and is in agreement with d/c plan.   I personally performed the services described in this documentation, which was scribed in my presence. The recorded information has been reviewed and is accurate.   Glade Nurse, [place] 01/16/13 [phone]

## 2013-01-25 ENCOUNTER — Encounter: Payer: Self-pay | Admitting: Family Medicine

## 2013-01-25 ENCOUNTER — Ambulatory Visit (INDEPENDENT_AMBULATORY_CARE_PROVIDER_SITE_OTHER): Payer: Medicare Other | Admitting: Family Medicine

## 2013-01-25 VITALS — BP 140/92 | HR 94 | Temp 98.2°F | Wt >= 6400 oz

## 2013-01-25 DIAGNOSIS — M25569 Pain in unspecified knee: Secondary | ICD-10-CM

## 2013-01-25 DIAGNOSIS — M25561 Pain in right knee: Secondary | ICD-10-CM

## 2013-01-25 MED ORDER — OXYCODONE-ACETAMINOPHEN 5-325 MG PO TABS
1.0000 | ORAL_TABLET | ORAL | Status: DC | PRN
Start: 2013-01-25 — End: 2014-02-20

## 2013-01-25 NOTE — Patient Instructions (Signed)
Knee Pain The knee is the complex joint between your thigh and your lower leg. It is made up of bones, tendons, ligaments, and cartilage. The bones that make up the knee are:  The femur in the thigh.  The tibia and fibula in the lower leg.  The patella or kneecap riding in the groove on the lower femur. CAUSES  Knee pain is a common complaint with many causes. A few of these causes are:  Injury, such as:  A ruptured ligament or tendon injury.  Torn cartilage.  Medical conditions, such as:  Gout  Arthritis  Infections  Overuse, over training or overdoing a physical activity. Knee pain can be minor or severe. Knee pain can accompany debilitating injury. Minor knee problems often respond well to self-care measures or get well on their own. More serious injuries may need medical intervention or even surgery. SYMPTOMS The knee is complex. Symptoms of knee problems can vary widely. Some of the problems are:  Pain with movement and weight bearing.  Swelling and tenderness.  Buckling of the knee.  Inability to straighten or extend your knee.  Your knee locks and you cannot straighten it.  Warmth and redness with pain and fever.  Deformity or dislocation of the kneecap. DIAGNOSIS  Determining what is wrong may be very straight forward such as when there is an injury. It can also be challenging because of the complexity of the knee. Tests to make a diagnosis may include:  Your caregiver taking a history and doing a physical exam.  Routine X-rays can be used to rule out other problems. X-rays will not reveal a cartilage tear. Some injuries of the knee can be diagnosed by:  Arthroscopy a surgical technique by which a small video camera is inserted through tiny incisions on the sides of the knee. This procedure is used to examine and repair internal knee joint problems. Tiny instruments can be used during arthroscopy to repair the torn knee cartilage (meniscus).  Arthrography  is a radiology technique. A contrast liquid is directly injected into the knee joint. Internal structures of the knee joint then become visible on X-ray film.  An MRI scan is a non x-ray radiology procedure in which magnetic fields and a computer produce two- or three-dimensional images of the inside of the knee. Cartilage tears are often visible using an MRI scanner. MRI scans have largely replaced arthrography in diagnosing cartilage tears of the knee.  Blood work.  Examination of the fluid that helps to lubricate the knee joint (synovial fluid). This is done by taking a sample out using a needle and a syringe. TREATMENT The treatment of knee problems depends on the cause. Some of these treatments are:  Depending on the injury, proper casting, splinting, surgery or physical therapy care will be needed.  Give yourself adequate recovery time. Do not overuse your joints. If you begin to get sore during workout routines, back off. Slow down or do fewer repetitions.  For repetitive activities such as cycling or running, maintain your strength and nutrition.  Alternate muscle groups. For example if you are a weight lifter, work the upper body on one day and the lower body the next.  Either tight or weak muscles do not give the proper support for your knee. Tight or weak muscles do not absorb the stress placed on the knee joint. Keep the muscles surrounding the knee strong.  Take care of mechanical problems.  If you have flat feet, orthotics or special shoes may help.   See your caregiver if you need help.  Arch supports, sometimes with wedges on the inner or outer aspect of the heel, can help. These can shift pressure away from the side of the knee most bothered by osteoarthritis.  A brace called an "unloader" brace also may be used to help ease the pressure on the most arthritic side of the knee.  If your caregiver has prescribed crutches, braces, wraps or ice, use as directed. The acronym for  this is PRICE. This means protection, rest, ice, compression and elevation.  Nonsteroidal anti-inflammatory drugs (NSAID's), can help relieve pain. But if taken immediately after an injury, they may actually increase swelling. Take NSAID's with food in your stomach. Stop them if you develop stomach problems. Do not take these if you have a history of ulcers, stomach pain or bleeding from the bowel. Do not take without your caregiver's approval if you have problems with fluid retention, heart failure, or kidney problems.  For ongoing knee problems, physical therapy may be helpful.  Glucosamine and chondroitin are over-the-counter dietary supplements. Both may help relieve the pain of osteoarthritis in the knee. These medicines are different from the usual anti-inflammatory drugs. Glucosamine may decrease the rate of cartilage destruction.  Injections of a corticosteroid drug into your knee joint may help reduce the symptoms of an arthritis flare-up. They may provide pain relief that lasts a few months. You may have to wait a few months between injections. The injections do have a small increased risk of infection, water retention and elevated blood sugar levels.  Hyaluronic acid injected into damaged joints may ease pain and provide lubrication. These injections may work by reducing inflammation. A series of shots may give relief for as long as 6 months.  Topical painkillers. Applying certain ointments to your skin may help relieve the pain and stiffness of osteoarthritis. Ask your pharmacist for suggestions. Many over the-counter products are approved for temporary relief of arthritis pain.  In some countries, doctors often prescribe topical NSAID's for relief of chronic conditions such as arthritis and tendinitis. A review of treatment with NSAID creams found that they worked as well as oral medications but without the serious side effects. PREVENTION  Maintain a healthy weight. Extra pounds put  more strain on your joints.  Get strong, stay limber. Weak muscles are a common cause of knee injuries. Stretching is important. Include flexibility exercises in your workouts.  Be smart about exercise. If you have osteoarthritis, chronic knee pain or recurring injuries, you may need to change the way you exercise. This does not mean you have to stop being active. If your knees ache after jogging or playing basketball, consider switching to swimming, water aerobics or other low-impact activities, at least for a few days a week. Sometimes limiting high-impact activities will provide relief.  Make sure your shoes fit well. Choose footwear that is right for your sport.  Protect your knees. Use the proper gear for knee-sensitive activities. Use kneepads when playing volleyball or laying carpet. Buckle your seat belt every time you drive. Most shattered kneecaps occur in car accidents.  Rest when you are tired. SEEK MEDICAL CARE IF:  You have knee pain that is continual and does not seem to be getting better.  SEEK IMMEDIATE MEDICAL CARE IF:  Your knee joint feels hot to the touch and you have a high fever. MAKE SURE YOU:   Understand these instructions.  Will watch your condition.  Will get help right away if you are not   doing well or get worse. Document Released: 07/10/2007 Document Revised: 12/05/2011 Document Reviewed: 07/10/2007 [employer] Patient Information 2013 [employer].  

## 2013-01-25 NOTE — Progress Notes (Signed)
  Subjective:    [name] is a 42 y.o. male who presents with a knee injury involving the right knee. Onset was sudden, related to a fall from standing. Mechanism of injury: fall. Inciting event: injured during a fall while walking down steps. Current symptoms include: giving out, stiffness and swelling. Pain is aggravated by any weight bearing. Patient has had no prior knee problems. Evaluation to date: plain films: effusion  and swelling---done in ED. Treatment to date: avoidance of offending activity, brace which is not very effective and prescription NSAIDS which are not very effective.  The following portions of the patient's history were reviewed and updated as appropriate: allergies, current medications, past family history, past medical history, past social history, past surgical history and problem list.   Review of Systems Pertinent items are noted in HPI.   Objective:    BP 140/92  Pulse 94  Temp(Src) 98.2 F (36.8 C) (Oral)  Wt 400 lb (181.439 kg)  BMI 50 kg/m2  SpO2 96% Right knee: positive exam findings: effusion, tenderness noted entire area and ecchymosis noted entire area and negative exam findings: no crepitus  Left knee:  normal and no effusion, full active range of motion, no joint line tenderness, ligamentous structures intact.   X-ray left knee: + effusion    Assessment:    Right pain with effusion    Plan:    Rest, ice, compression, and elevation (RICE) therapy. Straight leg brace. Orthopedics referral. f/u prn

## 2013-01-28 ENCOUNTER — Other Ambulatory Visit: Payer: Self-pay | Admitting: Sports Medicine

## 2013-01-28 DIAGNOSIS — M25561 Pain in right knee: Secondary | ICD-10-CM

## 2013-02-01 ENCOUNTER — Ambulatory Visit
Admission: RE | Admit: 2013-02-01 | Discharge: 2013-02-01 | Disposition: A | Discharge status: Home / Self Care | Payer: Medicare Other | Source: Ambulatory Visit | Attending: Sports Medicine | Admitting: Sports Medicine

## 2013-02-01 ENCOUNTER — Telehealth: Payer: Self-pay | Admitting: Family Medicine

## 2013-02-01 DIAGNOSIS — M25561 Pain in right knee: Secondary | ICD-10-CM

## 2013-02-01 NOTE — Telephone Encounter (Signed)
noted 

## 2013-02-01 NOTE — Telephone Encounter (Signed)
In reference to Urology referral entered 01/01/13, patient was notified by phone that he needed to contact [facility] regarding unfinished business prior to an appointment being offered.  Patient stated he would contact [facility].  On 01/10/13, patient had never contacted [facility], so I mailed him a letter.  As of today, patient has never scheduled, nor responded to letter.

## 2013-04-04 ENCOUNTER — Other Ambulatory Visit: Payer: Self-pay

## 2013-05-10 ENCOUNTER — Telehealth: Payer: Self-pay | Admitting: Family Medicine

## 2013-05-10 NOTE — Telephone Encounter (Signed)
Patient Information:  Caller Name: Zahmir  Phone: [phone]  Patient: Yojan, Paskett  Gender: Male  DOB: July 02, 1971  Age: 42 Years  PCP: Lelon Perla.  Office Follow Up:  Does the office need to follow up with this patient?: No  Instructions For The Office: N/A   Symptoms  Reason For Call & Symptoms: Curby states he has gout in right foot- onset of right ankle  pain on 05/08/13. Intermittent ankle swelling that improves with elevation. Per ankle pain protocol has go to office now disposition due to severe pain unrelieved by Ibuprofen. States he thinks he has been prescribed Colchicine in the past . Declines appointment . Requesting gout medication be called in to [facility] at [place] [address] [place]-[phone]  Reviewed Health History In EMR: Yes  Reviewed Medications In EMR: Yes  Reviewed Allergies In EMR: Yes  Reviewed Surgeries / Procedures: Yes  Date of Onset of Symptoms: 05/08/2013  Guideline(s) Used:  Foot Pain  Ankle Pain  Disposition Per Guideline:   Go to Office Now  Reason For Disposition Reached:   Severe pain (e.g., excruciating, unable to walk) and not improved after 2 hours of pain medicine  Advice Given:  Call Back If:  You become worse.  Patient Refused Recommendation:  Patient Requests Prescription  States he has been ordered  gout medicine in the past.

## 2013-05-13 NOTE — Telephone Encounter (Signed)
Please offer the patient an apt for gout evaluation. Thank You      KP

## 2013-05-13 NOTE — Telephone Encounter (Signed)
Please advise. Colchicine not on medication list. Please advise      KP

## 2013-05-13 NOTE — Telephone Encounter (Signed)
We have never given him colchicine-- -he would have to be seen

## 2013-05-14 NOTE — Telephone Encounter (Signed)
Called patient to schedule appointment. Line busy. Will try again later.

## 2013-09-06 ENCOUNTER — Emergency Department (HOSPITAL_COMMUNITY): Payer: Medicare Other

## 2013-09-06 ENCOUNTER — Encounter (HOSPITAL_COMMUNITY): Payer: Self-pay | Admitting: Emergency Medicine

## 2013-09-06 ENCOUNTER — Emergency Department (HOSPITAL_COMMUNITY)
Admission: EM | Admit: 2013-09-06 | Discharge: 2013-09-06 | Disposition: A | Discharge status: Home / Self Care | Payer: Medicare Other | Attending: Emergency Medicine | Admitting: Emergency Medicine

## 2013-09-06 DIAGNOSIS — E785 Hyperlipidemia, unspecified: Secondary | ICD-10-CM | POA: Insufficient documentation

## 2013-09-06 DIAGNOSIS — N281 Cyst of kidney, acquired: Secondary | ICD-10-CM

## 2013-09-06 DIAGNOSIS — R1031 Right lower quadrant pain: Secondary | ICD-10-CM | POA: Insufficient documentation

## 2013-09-06 DIAGNOSIS — Z791 Long term (current) use of non-steroidal anti-inflammatories (NSAID): Secondary | ICD-10-CM | POA: Insufficient documentation

## 2013-09-06 DIAGNOSIS — Z8669 Personal history of other diseases of the nervous system and sense organs: Secondary | ICD-10-CM | POA: Insufficient documentation

## 2013-09-06 DIAGNOSIS — I10 Essential (primary) hypertension: Secondary | ICD-10-CM | POA: Insufficient documentation

## 2013-09-06 DIAGNOSIS — E669 Obesity, unspecified: Secondary | ICD-10-CM | POA: Insufficient documentation

## 2013-09-06 DIAGNOSIS — R109 Unspecified abdominal pain: Secondary | ICD-10-CM

## 2013-09-06 DIAGNOSIS — Q619 Cystic kidney disease, unspecified: Secondary | ICD-10-CM | POA: Insufficient documentation

## 2013-09-06 DIAGNOSIS — Z8781 Personal history of (healed) traumatic fracture: Secondary | ICD-10-CM | POA: Insufficient documentation

## 2013-09-06 DIAGNOSIS — A599 Trichomoniasis, unspecified: Secondary | ICD-10-CM | POA: Insufficient documentation

## 2013-09-06 DIAGNOSIS — F319 Bipolar disorder, unspecified: Secondary | ICD-10-CM | POA: Insufficient documentation

## 2013-09-06 DIAGNOSIS — Z792 Long term (current) use of antibiotics: Secondary | ICD-10-CM | POA: Insufficient documentation

## 2013-09-06 DIAGNOSIS — Z79899 Other long term (current) drug therapy: Secondary | ICD-10-CM | POA: Insufficient documentation

## 2013-09-06 DIAGNOSIS — E119 Type 2 diabetes mellitus without complications: Secondary | ICD-10-CM | POA: Insufficient documentation

## 2013-09-06 DIAGNOSIS — R1110 Vomiting, unspecified: Secondary | ICD-10-CM | POA: Insufficient documentation

## 2013-09-06 LAB — URINALYSIS, ROUTINE W REFLEX MICROSCOPIC
Bilirubin Urine: NEGATIVE
Glucose, UA: NEGATIVE mg/dL
Ketones, ur: NEGATIVE mg/dL
Protein, ur: >300 mg/dL — AB
Urobilinogen, UA: 0.2 mg/dL (ref 0.0–1.0)

## 2013-09-06 LAB — CBC WITH DIFFERENTIAL/PLATELET
Basophils Absolute: 0.1 10*3/uL (ref 0.0–0.1)
Eosinophils Absolute: 0.2 10*3/uL (ref 0.0–0.7)
Eosinophils Relative: 2 % (ref 0–5)
HCT: 41.4 % (ref 39.0–52.0)
Lymphs Abs: 2.4 10*3/uL (ref 0.7–4.0)
MCH: 30.4 pg (ref 26.0–34.0)
MCV: 89.2 fL (ref 78.0–100.0)
Monocytes Absolute: 0.8 10*3/uL (ref 0.1–1.0)
Platelets: 217 10*3/uL (ref 150–400)
RDW: 13.9 % (ref 11.5–15.5)

## 2013-09-06 LAB — COMPREHENSIVE METABOLIC PANEL
ALT: 14 U/L (ref 0–53)
CO2: 24 mEq/L (ref 19–32)
Calcium: 9 mg/dL (ref 8.4–10.5)
Creatinine, Ser: 1 mg/dL (ref 0.50–1.35)
GFR calc Af Amer: >90 mL/min (ref >90)
GFR calc non Af Amer: 90 mL/min — ABNORMAL LOW (ref >90)
Glucose, Bld: 103 mg/dL — ABNORMAL HIGH (ref 70–99)
Sodium: 139 mEq/L (ref 135–145)
Total Protein: 7.5 g/dL (ref 6.0–8.3)

## 2013-09-06 LAB — URINE MICROSCOPIC-ADD ON

## 2013-09-06 LAB — CG4 I-STAT (LACTIC ACID): Lactic Acid, Venous: 1.03 mmol/L (ref 0.5–2.2)

## 2013-09-06 MED ORDER — CEFTRIAXONE SODIUM 1 G IJ SOLR
1.0000 g | Freq: Once | INTRAMUSCULAR | Status: AC
  Administered 2013-09-06: 1 g via INTRAMUSCULAR
  Filled 2013-09-06: qty 10

## 2013-09-06 MED ORDER — AZITHROMYCIN 1 G PO PACK
1.0000 g | PACK | Freq: Once | ORAL | Status: AC
  Administered 2013-09-06: 1 g via ORAL
  Filled 2013-09-06: qty 1

## 2013-09-06 MED ORDER — METRONIDAZOLE 500 MG PO TABS: 500.0000 mg | ORAL_TABLET | Freq: Two times a day (BID) | ORAL | Status: DC

## 2013-09-06 MED ORDER — LIDOCAINE HCL (PF) 1 % IJ SOLN
INTRAMUSCULAR | Status: AC
  Administered 2013-09-06: 2 mL
  Filled 2013-09-06: qty 5

## 2013-09-06 MED ORDER — KETOROLAC TROMETHAMINE 30 MG/ML IJ SOLN
30.0000 mg | Freq: Once | INTRAMUSCULAR | Status: AC
  Administered 2013-09-06: 30 mg via INTRAVENOUS
  Filled 2013-09-06: qty 1

## 2013-09-06 MED ORDER — SODIUM CHLORIDE 0.9 % IV BOLUS (SEPSIS)
1000.0000 mL | Freq: Once | INTRAVENOUS | Status: AC
  Administered 2013-09-06: 1000 mL via INTRAVENOUS

## 2013-09-06 MED ORDER — IOHEXOL 300 MG/ML  SOLN: 25.0000 mL | INTRAMUSCULAR | Status: AC

## 2013-09-06 MED ORDER — IOHEXOL 300 MG/ML  SOLN
100.0000 mL | Freq: Once | INTRAMUSCULAR | Status: AC | PRN
  Administered 2013-09-06: 100 mL via INTRAVENOUS

## 2013-09-06 NOTE — ED Notes (Signed)
Pt. States back pain started 3 days ago can not recall anything in particular that would have caused it.

## 2013-09-06 NOTE — ED Notes (Signed)
Pt. reports right low back pain for 3 days , denies injury or fall , ambulatory , no hematuria or dysuria .

## 2013-09-06 NOTE — ED Provider Notes (Signed)
CSN: [id number]     Arrival date & time 09/06/13  1907 History  This chart was scribed for non-physician practitioner, Raymon Mutton, PA-C,working with Flint Melter, MD, by Karle Plumber, ED Scribe.  This patient was seen in room TR10C/TR10C and the patient's care was started at 7:34 PM.  Chief Complaint  Patient presents with  . Back Pain   The history is provided by the patient. No language interpreter was used.   HPI Comments:  [name] is a 42 y.o. male with h/o kidney cysts who presents to the Emergency Department complaining of constant severe right-sided lower back pain that started approximately three days ago. Patient reported that the pain began as an aching sensation and has progressively turned in to a pressure sensation without radiation. Patient reported that the pain is localized to the right side of the back, near the flank area - stated that the pain is a stabbing/jabbing sensation with motion. He reports one episode of vomiting. He denies any bowel or bladder incontinence, dysuria, hematuria, abdominal pain, hematochezia, constipation, diarrhea, groin pain, testicular pain, SOB, CP, neck pain, h/o kidney stones, numbness or tingling of legs, pain with leg motion, or calf pain.  He denies h/o back surgeries.    Past Medical History  Diagnosis Date  . Diabetes mellitus   . Obesity   . Hypertension   . Bipolar affective disorder   . Sleep apnea   . Gout   . Hyperlipidemia     Per pt - reported on 03/07/12  . Ankle fracture, right    Past Surgical History  Procedure Laterality Date  . Keloid excision    . Vasectomy     Family History  Problem Relation Age of Onset  . Hypertension Mother   . Diabetes Mother   . Hyperlipidemia Mother   . Throat cancer      aunt  . Kidney failure    . Pancreatitis Father   . Arthritis Father   . Heart attack Brother     35  . Heart disease Brother     MI  . Heart attack Maternal Grandmother   . Cancer Maternal  Grandmother     breast  . Kidney disease Sister     dialysis  . Lupus Sister   . Stroke Sister   . Diabetes Maternal Aunt   . Heart disease Maternal Aunt   . Diabetes Paternal Aunt   . Heart disease Paternal Aunt     pacemaker  . Heart disease Paternal Uncle     pacemaker  . Diabetes Paternal Grandmother   . Stroke Paternal Aunt   . COPD Maternal Aunt   . Heart disease Maternal Aunt    History  Substance Use Topics  . Smoking status: Current Every Day Smoker -- 0.50 packs/day for 25 years    Types: Cigarettes  . Smokeless tobacco: Not on file  . Alcohol Use: No    Review of Systems  Respiratory: Negative for shortness of breath.   Cardiovascular: Negative for chest pain and leg swelling.  Gastrointestinal: Positive for vomiting. Negative for nausea, abdominal pain, diarrhea, constipation and blood in stool.  Genitourinary: Positive for flank pain. Negative for dysuria, hematuria, penile pain and testicular pain.  Musculoskeletal: Positive for back pain. Negative for neck pain.  All other systems reviewed and are negative.    Allergies  Cucumber extract  Home Medications   Current Outpatient Rx  Name  Route  Sig  Dispense  Refill  .  atorvastatin (LIPITOR) 40 MG tablet   Oral   Take 1 tablet (40 mg total) by mouth daily.   90 tablet   0   . fenofibrate 160 MG tablet   Oral   Take 1 tablet (160 mg total) by mouth daily.   90 tablet   0   . ibuprofen (ADVIL,MOTRIN) 800 MG tablet   Oral   Take 1 tablet (800 mg total) by mouth 3 (three) times daily.   21 tablet   0   . lisinopril (PRINIVIL,ZESTRIL) 20 MG tablet   Oral   Take 1 tablet (20 mg total) by mouth daily.   90 tablet   3   . metFORMIN (GLUCOPHAGE XR) 500 MG 24 hr tablet   Oral   Take 2 tablets (1,000 mg total) by mouth every evening.   60 tablet   2   . oxyCODONE-acetaminophen (PERCOCET) 5-325 MG per tablet   Oral   Take 1 tablet by mouth every 4 (four) hours as needed for pain.   20  tablet   0   . sildenafil (VIAGRA) 100 MG tablet   Oral   Take 50-100 mg by mouth daily as needed for erectile dysfunction.         . cyclobenzaprine (FLEXERIL) 10 MG tablet   Oral   Take 10 mg by mouth 3 (three) times daily as needed for muscle spasms.         . diazepam (VALIUM) 5 MG tablet   Oral   Take 1 tablet (5 mg total) by mouth 2 (two) times daily.   10 tablet   0   . doxycycline (VIBRA-TABS) 100 MG tablet   Oral   Take 100 mg by mouth 2 (two) times daily. Start date unknown. Duration unknown to pt         . metroNIDAZOLE (FLAGYL) 500 MG tablet   Oral   Take 1 tablet (500 mg total) by mouth 2 (two) times daily.   14 tablet   0    Triage Vitals: BP 115/79  Pulse 75  Temp(Src) 97.5 F (36.4 C) (Oral)  Resp 22  Ht 6\' 3"  (1.905 m)  Wt 393 lb 6.4 oz (178.445 kg)  BMI 49.17 kg/m2  SpO2 98% Physical Exam  Nursing note and vitals reviewed. Constitutional: He is oriented to person, place, and time. He appears well-developed and well-nourished. No distress.  Patient nondiaphoretic  HENT:  Head: Normocephalic and atraumatic.  Neck: Normal range of motion. Neck supple.  Negative neck stiffness Negative nuchal rigidity Negative pain upon palpation to the C-spine  Cardiovascular: Normal rate, regular rhythm and normal heart sounds.  Exam reveals no friction rub.   No murmur heard. Pulses:      Radial pulses are 2+ on the right side, and 2+ on the left side.       Dorsalis pedis pulses are 2+ on the right side, and 2+ on the left side.  Pulmonary/Chest: Effort normal and breath sounds normal. No respiratory distress. He has no wheezes. He has no rales.  Abdominal: Soft. Bowel sounds are normal. He exhibits no distension. There is tenderness in the right lower quadrant. There is CVA tenderness (right sided). There is no guarding and negative Murphy's sign.    Obese CVA tenderness to the right side identified Discomfort upon palpation to the epigastric region  and RLQ  Musculoskeletal: Normal range of motion.  Full ROM to upper and lower extremities bilaterally without difficulty noted Negative swelling, erythema, inflammation, lesions,  sores, deformities, bulging noted to the cervical, thoracic, lumbosacral spine. Negative pain upon palpation to the mid spinal region of the cervical, thoracic, lumbosacral spine. Discomfort upon palpation to lower thoracic, lumbosacral spine right side paravertebral region. Normal straight leg raise, bilaterally.  Neurological: He is alert and oriented to person, place, and time. He exhibits normal muscle tone. Coordination normal.  Strength 5+/5+ to upper and lower tremors bilaterally with resistance applied, equal distribution noted Sensation intact Negative saddle anesthesia bilaterally  Skin: Skin is warm and dry. No rash noted. He is not diaphoretic. No erythema.  Psychiatric: He has a normal mood and affect. His behavior is normal.    ED Course  Procedures (including critical care time) DIAGNOSTIC STUDIES: Oxygen Saturation is 98% on RA, normal by my interpretation.   COORDINATION OF CARE: 7:40 PM- Will obtain blood work and a CT scan. Pt verbalizes understanding and agrees to plan.  Medications  iohexol (OMNIPAQUE) 300 MG/ML solution 25 mL (not administered)  cefTRIAXone (ROCEPHIN) injection 1 g (not administered)  azithromycin (ZITHROMAX) powder 1 g (not administered)  lidocaine (PF) (XYLOCAINE) 1 % injection (not administered)  sodium chloride 0.9 % bolus 1,000 mL (0 mLs Intravenous Stopped 09/06/13 2131)  iohexol (OMNIPAQUE) 300 MG/ML solution 100 mL (100 mLs Intravenous Contrast Given 09/06/13 2052)  ketorolac (TORADOL) 30 MG/ML injection 30 mg (30 mg Intravenous Given 09/06/13 2131)   Results for orders placed during the hospital encounter of 09/06/13  URINALYSIS, ROUTINE W REFLEX MICROSCOPIC      Result Value Range   Color, Urine YELLOW  YELLOW   APPearance HAZY (*) CLEAR   Specific Gravity,  Urine 1.019  1.005 - 1.030   pH 5.5  5.0 - 8.0   Glucose, UA NEGATIVE  NEGATIVE mg/dL   Hgb urine dipstick SMALL (*) NEGATIVE   Bilirubin Urine NEGATIVE  NEGATIVE   Ketones, ur NEGATIVE  NEGATIVE mg/dL   Protein, ur >161 (*) NEGATIVE mg/dL   Urobilinogen, UA 0.2  0.0 - 1.0 mg/dL   Nitrite NEGATIVE  NEGATIVE   Leukocytes, UA SMALL (*) NEGATIVE  CBC WITH DIFFERENTIAL      Result Value Range   WBC 11.6 (*) 4.0 - 10.5 K/uL   RBC 4.64  4.22 - 5.81 MIL/uL   Hemoglobin 14.1  13.0 - 17.0 g/dL   HCT 09.6  04.5 - 40.9 %   MCV 89.2  78.0 - 100.0 fL   MCH 30.4  26.0 - 34.0 pg   MCHC 34.1  30.0 - 36.0 g/dL   RDW 81.1  91.4 - 78.2 %   Platelets 217  150 - 400 K/uL   Neutrophils Relative % 70  43 - 77 %   Neutro Abs 8.1 (*) 1.7 - 7.7 K/uL   Lymphocytes Relative 21  12 - 46 %   Lymphs Abs 2.4  0.7 - 4.0 K/uL   Monocytes Relative 7  3 - 12 %   Monocytes Absolute 0.8  0.1 - 1.0 K/uL   Eosinophils Relative 2  0 - 5 %   Eosinophils Absolute 0.2  0.0 - 0.7 K/uL   Basophils Relative 0  0 - 1 %   Basophils Absolute 0.1  0.0 - 0.1 K/uL  COMPREHENSIVE METABOLIC PANEL      Result Value Range   Sodium 139  135 - 145 mEq/L   Potassium 4.5  3.5 - 5.1 mEq/L   Chloride 106  96 - 112 mEq/L   CO2 24  19 - 32 mEq/L  Glucose, Bld 103 (*) 70 - 99 mg/dL   BUN 9  6 - 23 mg/dL   Creatinine, Ser 6.21  0.50 - 1.35 mg/dL   Calcium 9.0  8.4 - 30.8 mg/dL   Total Protein 7.5  6.0 - 8.3 g/dL   Albumin 3.3 (*) 3.5 - 5.2 g/dL   AST 14  0 - 37 U/L   ALT 14  0 - 53 U/L   Alkaline Phosphatase 67  39 - 117 U/L   Total Bilirubin 0.4  0.3 - 1.2 mg/dL   GFR calc non Af Amer 90 (*) >90 mL/min   GFR calc Af Amer >90  >90 mL/min  LIPASE, BLOOD      Result Value Range   Lipase 13  11 - 59 U/L  URINE MICROSCOPIC-ADD ON      Result Value Range   Squamous Epithelial / LPF MANY (*) RARE   WBC, UA TOO NUMEROUS TO COUNT  <3 WBC/hpf   RBC / HPF 3-6  <3 RBC/hpf   Bacteria, UA FEW (*) RARE   Urine-Other TRICHOMONAS PRESENT     CG4 I-STAT (LACTIC ACID)      Result Value Range   Lactic Acid, Venous 1.03  0.5 - 2.2 mmol/L   Ct Abdomen Pelvis W Contrast  09/06/2013   CLINICAL DATA:  Right lower back and right lower quadrant abdominal pain and vomiting.  EXAM: CT ABDOMEN AND PELVIS WITH CONTRAST  TECHNIQUE: Multidetector CT imaging of the abdomen and pelvis was performed using the standard protocol following bolus administration of intravenous contrast.  CONTRAST:  OMNIPAQUE IOHEXOL 300 MG/ML  SOLN  COMPARISON:  CT abdomen and pelvis performed 05/22/2007  FINDINGS: A 7 mm nodule along the right major fissure likely reflects a normal lymph node.  The liver and spleen are unremarkable in appearance. The gallbladder is within normal limits. The pancreas and adrenal glands are unremarkable.  There is a 5.5 x 4.5 cm cystic focus at the interpole region and lower pole of the right kidney; this remains relatively indistinct, and has increased in size since 2008, but the attenuation and the relatively slow rate of growth are most compatible with a cyst. Mild nonspecific perinephric stranding is noted bilaterally. No renal or ureteral stones are seen. There is no evidence of hydronephrosis.  No free fluid is identified. The small bowel is unremarkable in appearance. The stomach is within normal limits. No acute vascular abnormalities are seen. Scattered borderline prominent periaortic nodes are stable from 2008 and likely reflect the patient's baseline.  The appendix is normal in caliber, without evidence for appendicitis. The colon is unremarkable in appearance.  The bladder is mildly distended and grossly unremarkable in appearance. The prostate remains normal in size. No inguinal lymphadenopathy is seen; scattered bilateral inguinal nodes remain normal in size.  No acute osseous abnormalities are identified.  IMPRESSION: 1. No acute abnormality seen within the abdomen or pelvis. 2. 5.5 cm cyst at the right kidney has increased  gradually in size since 2004; its appearance is most compatible with a simple cyst. 3. Scattered borderline prominent periaortic nodes are stable from 2008 and likely reflect the patient's baseline. 4. 7 mm nodule along the right major fissure likely reflects a normal lymph node.   Electronically Signed   By: Roanna Raider M.D.   On: 09/06/2013 21:23   Labs Review Labs Reviewed  URINALYSIS, ROUTINE W REFLEX MICROSCOPIC - Abnormal; Notable for the following:    APPearance HAZY (*)  Hgb urine dipstick SMALL (*)    Protein, ur >300 (*)    Leukocytes, UA SMALL (*)    All other components within normal limits  CBC WITH DIFFERENTIAL - Abnormal; Notable for the following:    WBC 11.6 (*)    Neutro Abs 8.1 (*)    All other components within normal limits  COMPREHENSIVE METABOLIC PANEL - Abnormal; Notable for the following:    Glucose, Bld 103 (*)    Albumin 3.3 (*)    GFR calc non Af Amer 90 (*)    All other components within normal limits  URINE MICROSCOPIC-ADD ON - Abnormal; Notable for the following:    Squamous Epithelial / LPF MANY (*)    Bacteria, UA FEW (*)    All other components within normal limits  URINE CULTURE  LIPASE, BLOOD  CG4 I-STAT (LACTIC ACID)   Imaging Review Ct Abdomen Pelvis W Contrast  09/06/2013   CLINICAL DATA:  Right lower back and right lower quadrant abdominal pain and vomiting.  EXAM: CT ABDOMEN AND PELVIS WITH CONTRAST  TECHNIQUE: Multidetector CT imaging of the abdomen and pelvis was performed using the standard protocol following bolus administration of intravenous contrast.  CONTRAST:  OMNIPAQUE IOHEXOL 300 MG/ML  SOLN  COMPARISON:  CT abdomen and pelvis performed 05/22/2007  FINDINGS: A 7 mm nodule along the right major fissure likely reflects a normal lymph node.  The liver and spleen are unremarkable in appearance. The gallbladder is within normal limits. The pancreas and adrenal glands are unremarkable.  There is a 5.5 x 4.5 cm cystic focus at the  interpole region and lower pole of the right kidney; this remains relatively indistinct, and has increased in size since 2008, but the attenuation and the relatively slow rate of growth are most compatible with a cyst. Mild nonspecific perinephric stranding is noted bilaterally. No renal or ureteral stones are seen. There is no evidence of hydronephrosis.  No free fluid is identified. The small bowel is unremarkable in appearance. The stomach is within normal limits. No acute vascular abnormalities are seen. Scattered borderline prominent periaortic nodes are stable from 2008 and likely reflect the patient's baseline.  The appendix is normal in caliber, without evidence for appendicitis. The colon is unremarkable in appearance.  The bladder is mildly distended and grossly unremarkable in appearance. The prostate remains normal in size. No inguinal lymphadenopathy is seen; scattered bilateral inguinal nodes remain normal in size.  No acute osseous abnormalities are identified.  IMPRESSION: 1. No acute abnormality seen within the abdomen or pelvis. 2. 5.5 cm cyst at the right kidney has increased gradually in size since 2004; its appearance is most compatible with a simple cyst. 3. Scattered borderline prominent periaortic nodes are stable from 2008 and likely reflect the patient's baseline. 4. 7 mm nodule along the right major fissure likely reflects a normal lymph node.   Electronically Signed   By: Roanna Raider M.D.   On: 09/06/2013 21:23    EKG Interpretation   None       MDM   1. Right flank pain   2. Trichomoniasis   3. Renal cyst     Medications  iohexol (OMNIPAQUE) 300 MG/ML solution 25 mL (not administered)  cefTRIAXone (ROCEPHIN) injection 1 g (not administered)  azithromycin (ZITHROMAX) powder 1 g (not administered)  lidocaine (PF) (XYLOCAINE) 1 % injection (not administered)  sodium chloride 0.9 % bolus 1,000 mL (0 mLs Intravenous Stopped 09/06/13 2131)  iohexol (OMNIPAQUE) 300  MG/ML solution  100 mL (100 mLs Intravenous Contrast Given 09/06/13 2052)  ketorolac (TORADOL) 30 MG/ML injection 30 mg (30 mg Intravenous Given 09/06/13 2131)   Filed Vitals:   09/06/13 1914 09/06/13 2131  BP: 115/79 153/102  Pulse: 75 56  Temp: 97.5 F (36.4 C) 98 F (36.7 C)  TempSrc: Oral Oral  Resp: 22 18  Height: 6\' 3"  (1.905 m)   Weight: 393 lb 6.4 oz (178.445 kg)   SpO2: 98% 98%    I personally performed the services described in this documentation, which was scribed in my presence. The recorded information has been reviewed and is accurate.  Patient presenting to emergency Department with right-sided back pain that has been ongoing for the past 3 days. Patient reports that it started out as an aching sensation has gradually gotten worse described as a constant throbbing sensation without radiation. Patient reports that one episode of emesis this morning, reported that he has not had an episode since then. Denied fall, injury, numbness, tingling, urinary and bowel incontinence, history of kidney stones. Alert and oriented. GCS 15. Heart rate and rhythm normal. Lungs clear to auscultation bilaterally. Pulses palpable and strong, radial and DP 2+ bilaterally. Right-sided CVA tenderness noted. Discomfort upon palpation to the lower thoracic, lumbosacral paravertebral right-sided discomfort. Bowel sounds normal active in all 4 quadrants of the abdomen, soft, obese, discomfort upon palpation to the right lower quadrant upon palpation. Negative McBurney's point. Normal straight leg raise. Negative deformities noted to the cervical, thoracic, lumbosacral spine-mid spinal region - negative pain upon palpation to the mid spinal region.  Lactic acid negative elevation. CBC noted elevation white blood cell count of 11.6 with mild elevation of neutrophils 8.1-negative left shift identified, remaining leukocytosis not found. CMP negative findings. Lipase negative elevation. Urinalysis noted small  amount hemoglobin with a small amount of leukocytes. Urine microscopic positive for numerous white blood cells, too numerous to count with Trichomonas identified in the mucus. CT abdomen and pelvis identified no acute abnormalities for identification of the pelvis and abdominal pain.-negative hydronephrosis, findings for kidney stones negative. 5.5 cm cyst of the right kidney has gradually increased in size since 2004-appearance is most compatible with simple cyst. 7 mm nodule on the right major fissure likely reflects a normal lymph node. Doubt epidural abscess. Doubt cauda equina. Doubt appendicitis. Doubt acute cholecystitis. Negative findings for nephrolithiasis. Negative findings for hydronephrosis - doubt pyelonephritis. Patient stable, afebrile. Patient non septic-appearing. Patient presenting with urethritis. Patient presenting to emergency department with Trichomonas identified in urine with numerous white blood cell count. Patient treated prophylactically with ceftriaxone and azithromycin. Patient to be discharged with antibiotics. [facility], [facility]D clinic. Discussed with patient safe sex habits. Discussed with patient to refrain from sexual activity until properly treated. Recommended that partner be tested and treated as well. Discussed with patient to closely monitor symptoms and if symptoms are to worsen or change to report back to the emergency department - strict return structures given. Patient agreed to plan of care, understood, all questions answered.  Raymon Mutton, PA-C 09/07/13 [phone]

## 2013-09-08 LAB — URINE CULTURE

## 2013-09-08 NOTE — ED Provider Notes (Signed)
Medical screening examination/treatment/procedure(s) were performed by non-physician practitioner and as supervising physician I was immediately available for consultation/collaboration.  Flint Melter, MD 09/08/13 [phone]

## 2013-10-01 ENCOUNTER — Ambulatory Visit: Payer: Medicare Other | Admitting: Family Medicine

## 2013-10-01 DIAGNOSIS — Z0289 Encounter for other administrative examinations: Secondary | ICD-10-CM

## 2013-11-08 ENCOUNTER — Ambulatory Visit: Payer: Medicare Other | Admitting: Pulmonary Disease

## 2013-12-04 ENCOUNTER — Encounter: Payer: Self-pay | Admitting: Pulmonary Disease

## 2014-02-06 ENCOUNTER — Telehealth: Payer: Self-pay | Admitting: *Deleted

## 2014-02-06 NOTE — Telephone Encounter (Signed)
Patient dropped off application for discharge of a Pharmacist, community. Form filled out as much as possible and placed in folder for Dr. Etter Sjogren. JG//CMA

## 2014-02-12 NOTE — Telephone Encounter (Signed)
Called and left message for patient to please call back because an appointment is required for form to be filled out. JG//CMA

## 2014-02-20 ENCOUNTER — Encounter: Payer: Self-pay | Admitting: Family Medicine

## 2014-02-20 ENCOUNTER — Ambulatory Visit (INDEPENDENT_AMBULATORY_CARE_PROVIDER_SITE_OTHER): Payer: Medicare Other | Admitting: Family Medicine

## 2014-02-20 VITALS — BP 126/96 | HR 54 | Temp 98.2°F | Ht 75.0 in | Wt 394.0 lb

## 2014-02-20 DIAGNOSIS — E1159 Type 2 diabetes mellitus with other circulatory complications: Secondary | ICD-10-CM

## 2014-02-20 DIAGNOSIS — E785 Hyperlipidemia, unspecified: Secondary | ICD-10-CM

## 2014-02-20 DIAGNOSIS — L738 Other specified follicular disorders: Secondary | ICD-10-CM

## 2014-02-20 DIAGNOSIS — L739 Follicular disorder, unspecified: Secondary | ICD-10-CM

## 2014-02-20 LAB — BASIC METABOLIC PANEL
BUN: 13 mg/dL (ref 6–23)
CHLORIDE: 104 meq/L (ref 96–112)
CO2: 25 meq/L (ref 19–32)
CREATININE: 1.1 mg/dL (ref 0.4–1.5)
Calcium: 9.3 mg/dL (ref 8.4–10.5)
GFR: 79.58 mL/min (ref >60.00)
GLUCOSE: 164 mg/dL — AB (ref 70–99)
Potassium: 4.1 mEq/L (ref 3.5–5.1)
Sodium: 136 mEq/L (ref 135–145)

## 2014-02-20 LAB — HEPATIC FUNCTION PANEL
ALT: 18 U/L (ref 0–53)
AST: 15 U/L (ref 0–37)
Albumin: 3.5 g/dL (ref 3.5–5.2)
Alkaline Phosphatase: 66 U/L (ref 39–117)
Bilirubin, Direct: 0 mg/dL (ref 0.0–0.3)
TOTAL PROTEIN: 7.8 g/dL (ref 6.0–8.3)
Total Bilirubin: 0.5 mg/dL (ref 0.2–1.2)

## 2014-02-20 LAB — MICROALBUMIN / CREATININE URINE RATIO
CREATININE, U: 415 mg/dL
MICROALB/CREAT RATIO: 65.2 mg/g — AB (ref 0.0–30.0)
Microalb, Ur: 270.6 mg/dL — ABNORMAL HIGH (ref 0.0–1.9)

## 2014-02-20 LAB — LIPID PANEL
CHOLESTEROL: 235 mg/dL — AB (ref 0–200)
HDL: 28.4 mg/dL — ABNORMAL LOW (ref >39.00)
LDL Cholesterol: 148 mg/dL — ABNORMAL HIGH (ref 0–99)
Total CHOL/HDL Ratio: 8
Triglycerides: 292 mg/dL — ABNORMAL HIGH (ref 0.0–149.0)
VLDL: 58.4 mg/dL — ABNORMAL HIGH (ref 0.0–40.0)

## 2014-02-20 LAB — POCT URINALYSIS DIPSTICK
Bilirubin, UA: NEGATIVE
GLUCOSE UA: NEGATIVE
Ketones, UA: NEGATIVE
Leukocytes, UA: NEGATIVE
NITRITE UA: NEGATIVE
PROTEIN UA: 2000
RBC UA: NEGATIVE
Spec Grav, UA: >=1.03
UROBILINOGEN UA: 0.2
pH, UA: 6

## 2014-02-20 LAB — HEMOGLOBIN A1C: HEMOGLOBIN A1C: 8.3 % — AB (ref 4.6–6.5)

## 2014-02-20 MED ORDER — DOXYCYCLINE HYCLATE 100 MG PO TABS: 100.0000 mg | ORAL_TABLET | Freq: Two times a day (BID) | ORAL | Status: DC

## 2014-02-20 NOTE — Progress Notes (Signed)
Pre visit review using our clinic review tool, if applicable. No additional management support is needed unless otherwise documented below in the visit note. 

## 2014-02-20 NOTE — Patient Instructions (Signed)

## 2014-02-20 NOTE — Progress Notes (Signed)
   Subjective:    Patient ID: [name], male    DOB: [birth year], 43 y.o.   MRN: [id number]  Diabetes   Pt here to f/u glucose readings. No other compliants.     Review of Systems    as above Objective:   Physical Exam  Nursing note and vitals reviewed. Constitutional: He is oriented to person, place, and time. Vital signs are normal. He appears well-developed and well-nourished. He is sleeping.  HENT:  Head: Normocephalic and atraumatic.  Mouth/Throat: Oropharynx is clear and moist.  Eyes: EOM are normal. Pupils are equal, round, and reactive to light.  Neck: Normal range of motion. Neck supple. No thyromegaly present.  Cardiovascular: Normal rate and regular rhythm.   No murmur heard. Pulmonary/Chest: Effort normal and breath sounds normal. No respiratory distress. He has no wheezes. He has no rales. He exhibits no tenderness.  Musculoskeletal: He exhibits no edema and no tenderness.  Neurological: He is alert and oriented to person, place, and time.  Skin: Skin is warm and dry.  Psychiatric: He has a normal mood and affect. His behavior is normal. Judgment and thought content normal.   Filed Vitals:   02/20/14 1008  BP: 126/96  Pulse: 54  Temp: 98.2 F (36.8 C)  TempSrc: Oral  Height: 6\' 3"  (1.905 m)  Weight: 394 lb (178.717 kg)  SpO2: 95%          Assessment & Plan:  1. Type II or unspecified type diabetes mellitus with peripheral circulatory disorders, uncontrolled(250.72) Check labs , con't meds - Basic metabolic panel - Hepatic function panel - Hemoglobin A1c - Lipid panel - Microalbumin / creatinine urine ratio - POCT urinalysis dipstick  2. Other and unspecified hyperlipidemia Check labs - Basic metabolic panel - Hepatic function panel - Hemoglobin A1c - Lipid panel - Microalbumin / creatinine urine ratio - POCT urinalysis dipstick  3. Folliculitis abx ----rto prn - doxycycline (VIBRA-TABS) 100 MG tablet; Take 1 tablet (100 mg total)  by mouth 2 (two) times daily.  Dispense: 20 tablet; Refill: 0

## 2014-02-21 ENCOUNTER — Telehealth: Payer: Self-pay | Admitting: Family Medicine

## 2014-02-21 NOTE — Telephone Encounter (Signed)
Relevant patient education mailed to patient.  

## 2014-02-23 ENCOUNTER — Other Ambulatory Visit: Payer: Self-pay | Admitting: Family Medicine

## 2014-02-23 DIAGNOSIS — E1129 Type 2 diabetes mellitus with other diabetic kidney complication: Secondary | ICD-10-CM

## 2014-02-23 DIAGNOSIS — R809 Proteinuria, unspecified: Principal | ICD-10-CM

## 2014-02-28 ENCOUNTER — Telehealth: Payer: Self-pay

## 2014-02-28 DIAGNOSIS — R809 Proteinuria, unspecified: Secondary | ICD-10-CM

## 2014-02-28 MED ORDER — GLIPIZIDE-METFORMIN HCL 2.5-500 MG PO TABS: 1.0000 | ORAL_TABLET | Freq: Two times a day (BID) | ORAL | Status: DC

## 2014-02-28 NOTE — Telephone Encounter (Signed)
Spoke with patient and he voiced understanding. He said his blood sugars have been normal. He has agreed to Nephrology referral and to take the new medications.  Med's have been faxed and copy of his labs have been mailed.      KP

## 2014-02-28 NOTE — Telephone Encounter (Signed)
Message copied by Ewing Schlein on Fri Feb 28, 2014  9:45 AM ------      Message from: Rosalita Chessman      Created: Sun Feb 23, 2014  1:37 PM       + increase protein in urine--- persistant-- refer to nephrology      Dm not controlled--- d/c metformin and start glipizide/metformin 2.5/500 1 po bid #60 2 refills      Cholesterol--- LDL goal < 70,  HDL >40,  TG < 150.  Diet and exercise will increase HDL and decrease LDL and TG.  Fish,  Fish Oil, Flaxseed oil will also help increase the HDL and decrease Triglycerides.   Recheck labs in 3 months----  272.4  250.01  Lipid, hep, bmp, hgba1c.             ------

## 2014-03-22 ENCOUNTER — Emergency Department (HOSPITAL_COMMUNITY)
Admission: EM | Admit: 2014-03-22 | Discharge: 2014-03-22 | Disposition: A | Discharge status: Home / Self Care | Payer: Medicare Other | Attending: Emergency Medicine | Admitting: Emergency Medicine

## 2014-03-22 ENCOUNTER — Encounter (HOSPITAL_COMMUNITY): Payer: Self-pay | Admitting: Emergency Medicine

## 2014-03-22 DIAGNOSIS — R809 Proteinuria, unspecified: Secondary | ICD-10-CM | POA: Insufficient documentation

## 2014-03-22 DIAGNOSIS — E669 Obesity, unspecified: Secondary | ICD-10-CM | POA: Insufficient documentation

## 2014-03-22 DIAGNOSIS — E119 Type 2 diabetes mellitus without complications: Secondary | ICD-10-CM | POA: Insufficient documentation

## 2014-03-22 DIAGNOSIS — Z79899 Other long term (current) drug therapy: Secondary | ICD-10-CM | POA: Insufficient documentation

## 2014-03-22 DIAGNOSIS — F17200 Nicotine dependence, unspecified, uncomplicated: Secondary | ICD-10-CM | POA: Insufficient documentation

## 2014-03-22 DIAGNOSIS — Z8781 Personal history of (healed) traumatic fracture: Secondary | ICD-10-CM | POA: Insufficient documentation

## 2014-03-22 DIAGNOSIS — Z8659 Personal history of other mental and behavioral disorders: Secondary | ICD-10-CM | POA: Insufficient documentation

## 2014-03-22 DIAGNOSIS — I10 Essential (primary) hypertension: Secondary | ICD-10-CM | POA: Insufficient documentation

## 2014-03-22 DIAGNOSIS — M5489 Other dorsalgia: Secondary | ICD-10-CM

## 2014-03-22 DIAGNOSIS — E785 Hyperlipidemia, unspecified: Secondary | ICD-10-CM | POA: Insufficient documentation

## 2014-03-22 DIAGNOSIS — M549 Dorsalgia, unspecified: Secondary | ICD-10-CM | POA: Insufficient documentation

## 2014-03-22 LAB — URINALYSIS, ROUTINE W REFLEX MICROSCOPIC
Bilirubin Urine: NEGATIVE
GLUCOSE, UA: NEGATIVE mg/dL
Hgb urine dipstick: NEGATIVE
KETONES UR: NEGATIVE mg/dL
LEUKOCYTES UA: NEGATIVE
Nitrite: NEGATIVE
Specific Gravity, Urine: 1.025 (ref 1.005–1.030)
Urobilinogen, UA: 0.2 mg/dL (ref 0.0–1.0)
pH: 5.5 (ref 5.0–8.0)

## 2014-03-22 LAB — URINE MICROSCOPIC-ADD ON

## 2014-03-22 MED ORDER — OXYCODONE-ACETAMINOPHEN 5-325 MG PO TABS: 1.0000 | ORAL_TABLET | Freq: Four times a day (QID) | ORAL | Status: DC | PRN

## 2014-03-22 MED ORDER — OXYCODONE-ACETAMINOPHEN 5-325 MG PO TABS
1.0000 | ORAL_TABLET | Freq: Once | ORAL | Status: AC
  Administered 2014-03-22: 1 via ORAL
  Filled 2014-03-22: qty 1

## 2014-03-22 NOTE — Discharge Instructions (Signed)

## 2014-03-22 NOTE — ED Notes (Signed)
Pt states he has a cyst on his right kidney

## 2014-03-22 NOTE — ED Notes (Signed)
Pt. reports low back pain onset this evening , denies injury or fall , ambulatory , no urinary discomfort .

## 2014-03-22 NOTE — ED Provider Notes (Signed)
CSN: [id number]     Arrival date & time 03/22/14  0007 History   First MD Initiated Contact with Patient 03/22/14 [phone]     Chief Complaint  Patient presents with  . Back Pain     (Consider location/radiation/quality/duration/timing/severity/associated sxs/prior Treatment) Patient is a 43 y.o. male presenting with back pain. The history is provided by the patient.  Back Pain Associated symptoms: no abdominal pain, no chest pain, no headaches, no numbness and no weakness    patient with right-sided mid to lower back pain. Worse with movement. No trauma. Began while he was at rest. No dysuria. No chest pain. No cough. No nausea vomiting diarrhea. Patient states he has not had pains like this before but has had previous visit to the ER for right flank pain. States his been told by his primary care doctor that he has kidney problems on that side in a hole in his kidney. No weakness in his legs. No dysuria. No loss of bladder or bowel control.  Past Medical History  Diagnosis Date  . Diabetes mellitus   . Obesity   . Hypertension   . Bipolar affective disorder   . Sleep apnea   . Gout   . Hyperlipidemia     Per pt - reported on 03/07/12  . Ankle fracture, right    Past Surgical History  Procedure Laterality Date  . Keloid excision    . Vasectomy     Family History  Problem Relation Age of Onset  . Hypertension Mother   . Diabetes Mother   . Hyperlipidemia Mother   . Throat cancer      aunt  . Kidney failure    . Pancreatitis Father   . Arthritis Father   . Heart attack Brother     69  . Heart disease Brother     MI  . Heart attack Maternal Grandmother   . Cancer Maternal Grandmother     breast  . Kidney disease Sister     dialysis  . Lupus Sister   . Stroke Sister   . Diabetes Maternal Aunt   . Heart disease Maternal Aunt   . Diabetes Paternal Aunt   . Heart disease Paternal Aunt     pacemaker  . Heart disease Paternal Uncle     pacemaker  . Diabetes Paternal  Grandmother   . Stroke Paternal Aunt   . COPD Maternal Aunt   . Heart disease Maternal Aunt    History  Substance Use Topics  . Smoking status: Current Every Day Smoker -- 0.50 packs/day for 25 years    Types: Cigarettes  . Smokeless tobacco: Not on file  . Alcohol Use: No    Review of Systems  Constitutional: Negative for activity change and appetite change.  Eyes: Negative for pain.  Respiratory: Negative for chest tightness and shortness of breath.   Cardiovascular: Negative for chest pain and leg swelling.  Gastrointestinal: Negative for nausea, vomiting, abdominal pain and diarrhea.  Genitourinary: Negative for flank pain.  Musculoskeletal: Positive for back pain. Negative for neck stiffness.  Skin: Negative for rash.  Neurological: Negative for weakness, numbness and headaches.  Psychiatric/Behavioral: Negative for behavioral problems.      Allergies  Cucumber extract  Home Medications   Prior to Admission medications   Medication Sig Start Date End Date Taking? Authorizing Tonesha Tsou  atorvastatin (LIPITOR) 40 MG tablet Take 40 mg by mouth daily.   Yes Historical Blair Mesina, MD  fenofibrate 160 MG tablet Take 1 tablet (  160 mg total) by mouth daily. 12/26/12  Yes Alferd Apa Lowne, DO  glipiZIDE-metformin (METAGLIP) 2.5-500 MG per tablet Take 1 tablet by mouth 2 (two) times daily before a meal. 02/28/14  Yes Yvonne R Lowne, DO  lisinopril (PRINIVIL,ZESTRIL) 20 MG tablet Take 20 mg by mouth daily.   Yes Historical Sreekar Broyhill, MD  sildenafil (VIAGRA) 100 MG tablet Take 50-100 mg by mouth daily as needed for erectile dysfunction. 12/20/12  Yes Yvonne R Lowne, DO   BP 136/74  Pulse 69  Temp(Src) 98.1 F (36.7 C) (Oral)  Resp 24  Ht 6\' 3"  (1.905 m)  Wt 383 lb 11.2 oz (174.045 kg)  BMI 47.96 kg/m2  SpO2 96% Physical Exam  Nursing note and vitals reviewed. Constitutional: He is oriented to person, place, and time. He appears well-developed and well-nourished.  Patient is obese   HENT:  Head: Normocephalic and atraumatic.  Cardiovascular: Normal rate, regular rhythm and normal heart sounds.   No murmur heard. Pulmonary/Chest: Effort normal and breath sounds normal.  Abdominal: Soft. Bowel sounds are normal. He exhibits no distension and no mass. There is no tenderness. There is no rebound and no guarding.  Genitourinary:  Perineal sensation intact  Musculoskeletal: Normal range of motion. He exhibits tenderness. He exhibits no edema.  Tenderness to right mid back of her musculature. No rash.  Neurological: He is alert and oriented to person, place, and time. No cranial nerve deficit.  Skin: Skin is warm and dry.  Psychiatric: He has a normal mood and affect.    ED Course  Procedures (including critical care time) Labs Review Labs Reviewed  URINALYSIS, ROUTINE W REFLEX MICROSCOPIC - Abnormal; Notable for the following:    Protein, ur >300 (*)    All other components within normal limits  URINE MICROSCOPIC-ADD ON - Abnormal; Notable for the following:    Casts HYALINE CASTS (*)    All other components within normal limits    Imaging Review No results found.   EKG Interpretation None      MDM   Final diagnoses:  None    Patient with right back pain. Likely musculoskeletal. Has had previous CT scans ultrasounds that showed a renal cyst. No previous AAA. He has protein in the urine, which is not new for him and he was going to follow with nephrology for. Will discharge home with pain medicines.    Jasper Riling. Alvino Chapel, MD 03/22/14 [phone]

## 2014-04-22 ENCOUNTER — Telehealth: Payer: Self-pay | Admitting: Family Medicine

## 2014-04-22 NOTE — Telephone Encounter (Signed)
To MD for FYI.       KP 

## 2014-04-22 NOTE — Telephone Encounter (Signed)
Caller name: Pam Relation to [id number] Kidney Call back [phone]  Pharmacy:  Reason for call:  She tried calling pt on 03/19/14 and [facility] for callback. She tried again today and the number is disconnected.

## 2014-04-22 NOTE — Telephone Encounter (Signed)
Please send letter.

## 2014-04-23 NOTE — Telephone Encounter (Signed)
Marj, can you send this patient a letter per Dr. Nonda Lou request?

## 2014-04-25 NOTE — Telephone Encounter (Signed)
Letter mailed     KP 

## 2014-05-15 ENCOUNTER — Ambulatory Visit (INDEPENDENT_AMBULATORY_CARE_PROVIDER_SITE_OTHER): Payer: Medicare Other | Admitting: Family Medicine

## 2014-05-15 ENCOUNTER — Encounter: Payer: Self-pay | Admitting: Family Medicine

## 2014-05-15 VITALS — BP 132/82 | HR 74 | Temp 98.4°F | Ht 75.0 in | Wt 388.0 lb

## 2014-05-15 DIAGNOSIS — E1149 Type 2 diabetes mellitus with other diabetic neurological complication: Secondary | ICD-10-CM

## 2014-05-15 DIAGNOSIS — R8290 Unspecified abnormal findings in urine: Secondary | ICD-10-CM

## 2014-05-15 DIAGNOSIS — E785 Hyperlipidemia, unspecified: Secondary | ICD-10-CM

## 2014-05-15 DIAGNOSIS — R82998 Other abnormal findings in urine: Secondary | ICD-10-CM

## 2014-05-15 DIAGNOSIS — I10 Essential (primary) hypertension: Secondary | ICD-10-CM

## 2014-05-15 LAB — POCT URINALYSIS DIPSTICK
BILIRUBIN UA: NEGATIVE
Glucose, UA: NEGATIVE
KETONES UA: NEGATIVE
Nitrite, UA: NEGATIVE
Protein, UA: 300
SPEC GRAV UA: 1.015
Urobilinogen, UA: NEGATIVE
pH, UA: 5

## 2014-05-15 LAB — MICROALBUMIN / CREATININE URINE RATIO
Creatinine,U: 301.3 mg/dL
Microalb Creat Ratio: 45.9 mg/g — ABNORMAL HIGH (ref 0.0–30.0)

## 2014-05-15 LAB — BASIC METABOLIC PANEL
BUN: 11 mg/dL (ref 6–23)
CALCIUM: 9.2 mg/dL (ref 8.4–10.5)
CO2: 29 mEq/L (ref 19–32)
Chloride: 103 mEq/L (ref 96–112)
Creatinine, Ser: 1 mg/dL (ref 0.4–1.5)
GFR: 84.01 mL/min (ref >60.00)
GLUCOSE: 141 mg/dL — AB (ref 70–99)
Potassium: 4.2 mEq/L (ref 3.5–5.1)
Sodium: 137 mEq/L (ref 135–145)

## 2014-05-15 LAB — HEPATIC FUNCTION PANEL
ALK PHOS: 70 U/L (ref 39–117)
ALT: 15 U/L (ref 0–53)
AST: 16 U/L (ref 0–37)
Albumin: 3.4 g/dL — ABNORMAL LOW (ref 3.5–5.2)
Bilirubin, Direct: 0 mg/dL (ref 0.0–0.3)
TOTAL PROTEIN: 7.9 g/dL (ref 6.0–8.3)
Total Bilirubin: 0.9 mg/dL (ref 0.2–1.2)

## 2014-05-15 LAB — CBC WITH DIFFERENTIAL/PLATELET
BASOS ABS: 0.1 10*3/uL (ref 0.0–0.1)
Basophils Relative: 0.7 % (ref 0.0–3.0)
EOS ABS: 0.3 10*3/uL (ref 0.0–0.7)
Eosinophils Relative: 3.5 % (ref 0.0–5.0)
HCT: 41.9 % (ref 39.0–52.0)
Hemoglobin: 13.7 g/dL (ref 13.0–17.0)
LYMPHS PCT: 20.6 % (ref 12.0–46.0)
Lymphs Abs: 2 10*3/uL (ref 0.7–4.0)
MCHC: 32.6 g/dL (ref 30.0–36.0)
MCV: 88.4 fl (ref 78.0–100.0)
MONOS PCT: 6.5 % (ref 3.0–12.0)
Monocytes Absolute: 0.6 10*3/uL (ref 0.1–1.0)
NEUTROS ABS: 6.7 10*3/uL (ref 1.4–7.7)
Neutrophils Relative %: 68.7 % (ref 43.0–77.0)
PLATELETS: 234 10*3/uL (ref 150.0–400.0)
RBC: 4.74 Mil/uL (ref 4.22–5.81)
RDW: 14.1 % (ref 11.5–15.5)
WBC: 9.7 10*3/uL (ref 4.0–10.5)

## 2014-05-15 LAB — LDL CHOLESTEROL, DIRECT: Direct LDL: 155.7 mg/dL

## 2014-05-15 LAB — LIPID PANEL
Cholesterol: 228 mg/dL — ABNORMAL HIGH (ref 0–200)
HDL: 27.3 mg/dL — AB (ref >39.00)
NonHDL: 200.7
TRIGLYCERIDES: 257 mg/dL — AB (ref 0.0–149.0)
Total CHOL/HDL Ratio: 8
VLDL: 51.4 mg/dL — AB (ref 0.0–40.0)

## 2014-05-15 LAB — HEMOGLOBIN A1C: HEMOGLOBIN A1C: 9.2 % — AB (ref 4.6–6.5)

## 2014-05-15 MED ORDER — ONETOUCH DELICA LANCETS 33G MISC: Status: DC

## 2014-05-15 MED ORDER — GLUCOSE BLOOD VI STRP: ORAL_STRIP | Status: DC

## 2014-05-15 NOTE — Progress Notes (Signed)
Pre visit review using our clinic review tool, if applicable. No additional management support is needed unless otherwise documented below in the visit note. 

## 2014-05-15 NOTE — Progress Notes (Signed)
   Subjective:    Patient ID: [name], male    DOB: 18-Jul-1971, 43 y.o.   MRN: [id number]  HPI  Pt is here to f/u and have dmv paperwork filled out.  HYPERTENSION  Blood pressure range-not checking  Chest pain- no      Dyspnea- no Lightheadedness- no   Edema- no Other side effects - no   Medication compliance: good per pt Low salt diet- yes  DIABETES  Blood Sugar ranges-not checking  Polyuria- no New Visual problems- no Hypoglycemic symptoms- no Other side effects-no Medication compliance - good per pt Last eye exam- due Foot exam- today  HYPERLIPIDEMIA  Medication compliance- good RUQ pain- no  Muscle aches- no Other side effects-no   Review of Systems    as above Objective:   Physical Exam BP 132/82  Pulse 74  Temp(Src) 98.4 F (36.9 C) (Oral)  Ht 6\' 3"  (1.905 m)  Wt 388 lb (175.996 kg)  BMI 48.50 kg/m2  SpO2 96% General appearance: alert, cooperative, appears stated age and no distress Back: symmetric, no curvature. ROM normal. No CVA tenderness. Lungs: clear to auscultation bilaterally Heart: S1, S2 normal Extremities: extremities normal, atraumatic, no cyanosis or edema Sensory exam of the foot is normal, tested with the monofilament. Good pulses, no lesions or ulcers, good peripheral pulses.         Assessment & Plan:  1. Type II or unspecified type diabetes mellitus with neurological manifestations, not stated as uncontrolled Check labs - glucose blood test strip; One touch Verio test strips 1 box , use as directed  Dispense: 100 each; Refill: 11 - Basic metabolic panel - CBC with Differential - Hemoglobin A1c - Hepatic function panel - Lipid panel - Microalbumin / creatinine urine ratio - POCT urinalysis dipstick  2. Essential hypertension stable - Basic metabolic panel - CBC with Differential - Microalbumin / creatinine urine ratio - POCT urinalysis dipstick  3. Other and unspecified hyperlipidemia Check labs con't meds -  Hepatic function panel - Lipid panel  4. Abnormal urine   - Urine Culture

## 2014-05-16 ENCOUNTER — Telehealth: Payer: Self-pay | Admitting: Family Medicine

## 2014-05-16 ENCOUNTER — Other Ambulatory Visit: Payer: Self-pay | Admitting: Family Medicine

## 2014-05-16 ENCOUNTER — Telehealth: Payer: Self-pay

## 2014-05-16 DIAGNOSIS — IMO0002 Reserved for concepts with insufficient information to code with codable children: Secondary | ICD-10-CM

## 2014-05-16 DIAGNOSIS — E1165 Type 2 diabetes mellitus with hyperglycemia: Principal | ICD-10-CM

## 2014-05-16 DIAGNOSIS — E1151 Type 2 diabetes mellitus with diabetic peripheral angiopathy without gangrene: Secondary | ICD-10-CM

## 2014-05-16 LAB — URINE CULTURE
Colony Count: NO GROWTH
ORGANISM ID, BACTERIA: NO GROWTH

## 2014-05-16 NOTE — Telephone Encounter (Signed)
Received forms from [employer] requesting additional information.  They are the process of reviewing an application for a total and permanent disability discharge of the above individual's student loan obligations.  Forms labeled and placed in Dr. Nonda Lou red folder for completion and signature.

## 2014-05-16 NOTE — Telephone Encounter (Signed)
Relevant patient education assigned to patient using Emmi. ° °

## 2014-05-19 MED ORDER — EZETIMIBE 10 MG PO TABS: 10.0000 mg | ORAL_TABLET | Freq: Every day | ORAL | Status: DC

## 2014-05-20 NOTE — Telephone Encounter (Signed)
Received completed [employer] Form on (05/19/14) from Dr. Etter Sjogren.  Forms faxed to [employer] at [phone]).  Confirmation received.//AB/CMA

## 2014-07-24 ENCOUNTER — Emergency Department (HOSPITAL_COMMUNITY): Payer: Medicare Other

## 2014-07-24 ENCOUNTER — Encounter (HOSPITAL_COMMUNITY): Payer: Self-pay | Admitting: Emergency Medicine

## 2014-07-24 ENCOUNTER — Emergency Department (HOSPITAL_COMMUNITY)
Admission: EM | Admit: 2014-07-24 | Discharge: 2014-07-24 | Disposition: A | Discharge status: Home / Self Care | Payer: Medicare Other | Attending: Emergency Medicine | Admitting: Emergency Medicine

## 2014-07-24 DIAGNOSIS — R52 Pain, unspecified: Secondary | ICD-10-CM

## 2014-07-24 DIAGNOSIS — Z8669 Personal history of other diseases of the nervous system and sense organs: Secondary | ICD-10-CM | POA: Insufficient documentation

## 2014-07-24 DIAGNOSIS — E669 Obesity, unspecified: Secondary | ICD-10-CM | POA: Diagnosis not present

## 2014-07-24 DIAGNOSIS — M25562 Pain in left knee: Secondary | ICD-10-CM | POA: Diagnosis present

## 2014-07-24 DIAGNOSIS — E785 Hyperlipidemia, unspecified: Secondary | ICD-10-CM | POA: Diagnosis not present

## 2014-07-24 DIAGNOSIS — Z8659 Personal history of other mental and behavioral disorders: Secondary | ICD-10-CM | POA: Insufficient documentation

## 2014-07-24 DIAGNOSIS — Z8781 Personal history of (healed) traumatic fracture: Secondary | ICD-10-CM | POA: Diagnosis not present

## 2014-07-24 DIAGNOSIS — Z79899 Other long term (current) drug therapy: Secondary | ICD-10-CM | POA: Insufficient documentation

## 2014-07-24 DIAGNOSIS — Z720 Tobacco use: Secondary | ICD-10-CM | POA: Diagnosis not present

## 2014-07-24 DIAGNOSIS — I10 Essential (primary) hypertension: Secondary | ICD-10-CM | POA: Insufficient documentation

## 2014-07-24 DIAGNOSIS — E119 Type 2 diabetes mellitus without complications: Secondary | ICD-10-CM | POA: Diagnosis not present

## 2014-07-24 DIAGNOSIS — M25462 Effusion, left knee: Secondary | ICD-10-CM | POA: Diagnosis not present

## 2014-07-24 MED ORDER — OXYCODONE-ACETAMINOPHEN 5-325 MG PO TABS: 1.0000 | ORAL_TABLET | ORAL | Status: DC | PRN

## 2014-07-24 MED ORDER — OXYCODONE-ACETAMINOPHEN 5-325 MG PO TABS
2.0000 | ORAL_TABLET | ORAL | Status: AC
  Administered 2014-07-24: 2 via ORAL
  Filled 2014-07-24: qty 2

## 2014-07-24 MED ORDER — IBUPROFEN 800 MG PO TABS: 800.0000 mg | ORAL_TABLET | Freq: Three times a day (TID) | ORAL | Status: DC

## 2014-07-24 MED ORDER — KETOROLAC TROMETHAMINE 60 MG/2ML IM SOLN
60.0000 mg | INTRAMUSCULAR | Status: AC
  Administered 2014-07-24: 60 mg via INTRAMUSCULAR
  Filled 2014-07-24: qty 2

## 2014-07-24 NOTE — ED Notes (Signed)
Pt A&o4 AT DISCHARGE, nad

## 2014-07-24 NOTE — ED Notes (Signed)
Pt in c/o left knee pain and swelling that started on Sunday, states pain is increasing, states Saturday night he was at a party at a hotel and the elevator stopped working so he walked down 22 flights of stairs, since that time pain has increased

## 2014-07-24 NOTE — ED Provider Notes (Signed)
CSN: [id number]     Arrival date & time 07/24/14  1832 History  This chart was scribed for non-physician practitioner, Britt Bottom, NP, working with Dorie Rank, MD, by Delphia Grates, ED Scribe. This patient was seen in room TR05C/TR05C and the patient's care was started at 7:57 PM.     Chief Complaint  Patient presents with  . Knee Pain     The history is provided by the patient. No language interpreter was used.    HPI Comments: [name] is a 43 y.o. male, with history of DM, HTN, HLD, who presents to the Emergency Department complaining of gradually worsening, 8/10, "tight" left knee pain onset 5 days ago. Patient reports walking down 22 flights of stairs 5 days ago and felt weakness to his bilateral lower extremities and tightness in his left knee. He reports associated swelling the following day, and associated decreased ROM that began 3 days ago. He notes the pain is worse when weight bearing and has tried ibuprofen without significant improvement. He denies any fall or injury.   Past Medical History  Diagnosis Date  . Diabetes mellitus   . Obesity   . Hypertension   . Bipolar affective disorder   . Sleep apnea   . Gout   . Hyperlipidemia     Per pt - reported on 03/07/12  . Ankle fracture, right    Past Surgical History  Procedure Laterality Date  . Keloid excision    . Vasectomy     Family History  Problem Relation Age of Onset  . Hypertension Mother   . Diabetes Mother   . Hyperlipidemia Mother   . Throat cancer      aunt  . Kidney failure    . Pancreatitis Father   . Arthritis Father   . Heart attack Brother     25  . Heart disease Brother     MI  . Heart attack Maternal Grandmother   . Cancer Maternal Grandmother     breast  . Kidney disease Sister     dialysis  . Lupus Sister   . Stroke Sister   . Diabetes Maternal Aunt   . Heart disease Maternal Aunt   . Diabetes Paternal Aunt   . Heart disease Paternal Aunt     pacemaker  . Heart  disease Paternal Uncle     pacemaker  . Diabetes Paternal Grandmother   . Stroke Paternal Aunt   . COPD Maternal Aunt   . Heart disease Maternal Aunt    History  Substance Use Topics  . Smoking status: Current Every Day Smoker -- 0.50 packs/day for 25 years    Types: Cigarettes  . Smokeless tobacco: Not on file  . Alcohol Use: No    Review of Systems  Constitutional: Negative for fever and chills.  Respiratory: Negative for shortness of breath.   Musculoskeletal: Positive for arthralgias (left knee) and joint swelling (left knee).  Skin: Negative for rash.      Allergies  Cucumber extract  Home Medications   Prior to Admission medications   Medication Sig Start Date End Date Taking? Authorizing Provider  atorvastatin (LIPITOR) 40 MG tablet Take 40 mg by mouth daily.    Historical Provider, MD  ezetimibe (ZETIA) 10 MG tablet Take 1 tablet (10 mg total) by mouth daily. 05/19/14   Rosalita Chessman, DO  fenofibrate 160 MG tablet Take 1 tablet (160 mg total) by mouth daily. 12/26/12   Rosalita Chessman, DO  glipiZIDE-metformin (  METAGLIP) 2.5-500 MG per tablet Take 1 tablet by mouth 2 (two) times daily before a meal. 02/28/14   Rosalita Chessman, DO  glucose blood test strip One touch Verio test strips 1 box , use as directed 05/15/14   Rosalita Chessman, DO  lisinopril (PRINIVIL,ZESTRIL) 20 MG tablet Take 20 mg by mouth daily.    Historical Provider, MD  Jonetta Speak LANCETS 67R MISC Use as directed 05/15/14   Rosalita Chessman, DO  oxyCODONE-acetaminophen (PERCOCET/ROXICET) 5-325 MG per tablet Take 1-2 tablets by mouth every 6 (six) hours as needed for severe pain. 03/22/14   Jasper Riling. Pickering, MD  sildenafil (VIAGRA) 100 MG tablet Take 50-100 mg by mouth daily as needed for erectile dysfunction. 12/20/12   Rosalita Chessman, DO   Triage Vitals: BP 120/90  Pulse 69  Temp(Src) 98.2 F (36.8 C) (Oral)  Resp 20  Ht 6\' 3"  (1.905 m)  Wt 385 lb (174.635 kg)  BMI 48.12 kg/m2  SpO2 96%  Physical  Exam  Nursing note and vitals reviewed. Constitutional: He is oriented to person, place, and time. He appears well-developed and well-nourished. No distress.  HENT:  Head: Normocephalic and atraumatic.  Eyes: Conjunctivae and EOM are normal.  Neck: Neck supple. No tracheal deviation present.  Cardiovascular: Normal rate.   Pulmonary/Chest: Effort normal. No respiratory distress.  Musculoskeletal: He exhibits edema and tenderness.       Left knee: He exhibits decreased range of motion and swelling. He exhibits no erythema. Tenderness found.  Left knee has marked swelling. Swelling with palpable fluid. TTP to left lateral suprapatellar and TTP to infrapatellar. No redness, erythema.  Neurological: He is alert and oriented to person, place, and time.  Skin: Skin is warm and dry.  Psychiatric: He has a normal mood and affect. His behavior is normal.    ED Course  Procedures (including critical care time)  DIAGNOSTIC STUDIES: Oxygen Saturation is 96% on room air, adequate by my interpretation.    COORDINATION OF CARE: At 2002 Discussed treatment plan with patient which includes pain medication, compression sleeve, cane, and orthopedic referral. Patient agrees.   Labs Review Labs Reviewed - No data to display  Imaging Review Dg Knee Complete 4 Views Left  07/24/2014   CLINICAL DATA:  Pain and weakness after walking down flight of stairs  EXAM: LEFT KNEE - COMPLETE 4+ VIEW  COMPARISON:  June 17, 2010  FINDINGS: Frontal, lateral, and bilateral oblique views were obtained. There is no fracture or dislocation. There is a sizable joint effusion. There is spurring in all compartments. There is mild narrowing of the patellofemoral joint. No erosive change.  IMPRESSION: Large joint effusion. Areas of osteoarthritic change, most notably in the patellofemoral joint. No fracture or dislocation.   Electronically Signed   By: Lowella Grip M.D.   On: 07/24/2014 19:27     EKG  Interpretation None      MDM   Final diagnoses:  Knee effusion, left   43 yo male with knee pain after overuse. His x-ray is negative for obvious fracture or dislocation but shows significant effusion.  Pain managed in ED.  Pt is afebrile and knee is not warm or erythematous. Patient given knee sleeve and walker to aid with ambulation. Discharge instructions prescription for NSAIDS and pain meds and follow-up with ortho referral for drainage of effusion. Patient will be dc home & is agreeable with above plan. Return precautions provided.   I personally performed the services described in this  documentation, which was scribed in my presence. The recorded information has been reviewed and is accurate.  Filed Vitals:   07/24/14 1837 07/24/14 2020  BP: 120/90 136/87  Pulse: 69 68  Temp: 98.2 F (36.8 C) 97.8 F (36.6 C)  TempSrc: Oral Oral  Resp: 20 18  Height: 6\' 3"  (1.905 m)   Weight: 385 lb (174.635 kg)   SpO2: 96% 98%   Meds given in ED:  Medications  ketorolac (TORADOL) injection 60 mg (60 mg Intramuscular Given 07/24/14 2013)  oxyCODONE-acetaminophen (PERCOCET/ROXICET) 5-325 MG per tablet 2 tablet (2 tablets Oral Given 07/24/14 2012)    Discharge Medication List as of 07/24/2014  9:01 PM    START taking these medications   Details  ibuprofen (ADVIL,MOTRIN) 800 MG tablet Take 1 tablet (800 mg total) by mouth 3 (three) times daily., Starting 07/24/2014, Until Discontinued, Print    !! oxyCODONE-acetaminophen (PERCOCET/ROXICET) 5-325 MG per tablet Take 1-2 tablets by mouth every 4 (four) hours as needed for moderate pain or severe pain., Starting 07/24/2014, Until Discontinued, Print     !! - Potential duplicate medications found. Please discuss with provider.       Britt Bottom, NP 07/26/14 [phone]
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
## 2014-08-28 ENCOUNTER — Telehealth: Payer: Self-pay | Admitting: *Deleted

## 2014-08-28 ENCOUNTER — Ambulatory Visit: Payer: Medicare Other | Admitting: Family Medicine

## 2014-08-28 NOTE — Telephone Encounter (Signed)
Charge And reschedule

## 2014-08-28 NOTE — Telephone Encounter (Signed)
Pt did not show for appointment 08/28/2014 at 8:15am for 6 mo fu/AWV

## 2014-08-28 NOTE — Telephone Encounter (Signed)
See note below regarding no show charge

## 2014-12-18 ENCOUNTER — Emergency Department (HOSPITAL_COMMUNITY)
Admission: EM | Admit: 2014-12-18 | Discharge: 2014-12-19 | Disposition: A | Discharge status: Home / Self Care | Payer: Medicare Other | Attending: Emergency Medicine | Admitting: Emergency Medicine

## 2014-12-18 ENCOUNTER — Encounter (HOSPITAL_COMMUNITY): Payer: Self-pay | Admitting: Emergency Medicine

## 2014-12-18 DIAGNOSIS — E669 Obesity, unspecified: Secondary | ICD-10-CM | POA: Insufficient documentation

## 2014-12-18 DIAGNOSIS — Z8739 Personal history of other diseases of the musculoskeletal system and connective tissue: Secondary | ICD-10-CM | POA: Insufficient documentation

## 2014-12-18 DIAGNOSIS — Z79899 Other long term (current) drug therapy: Secondary | ICD-10-CM | POA: Insufficient documentation

## 2014-12-18 DIAGNOSIS — Z8659 Personal history of other mental and behavioral disorders: Secondary | ICD-10-CM | POA: Diagnosis not present

## 2014-12-18 DIAGNOSIS — Y999 Unspecified external cause status: Secondary | ICD-10-CM | POA: Insufficient documentation

## 2014-12-18 DIAGNOSIS — Z720 Tobacco use: Secondary | ICD-10-CM | POA: Diagnosis not present

## 2014-12-18 DIAGNOSIS — E119 Type 2 diabetes mellitus without complications: Secondary | ICD-10-CM | POA: Insufficient documentation

## 2014-12-18 DIAGNOSIS — E785 Hyperlipidemia, unspecified: Secondary | ICD-10-CM | POA: Insufficient documentation

## 2014-12-18 DIAGNOSIS — Z8669 Personal history of other diseases of the nervous system and sense organs: Secondary | ICD-10-CM | POA: Insufficient documentation

## 2014-12-18 DIAGNOSIS — S29001A Unspecified injury of muscle and tendon of front wall of thorax, initial encounter: Secondary | ICD-10-CM | POA: Insufficient documentation

## 2014-12-18 DIAGNOSIS — S4992XA Unspecified injury of left shoulder and upper arm, initial encounter: Secondary | ICD-10-CM | POA: Insufficient documentation

## 2014-12-18 DIAGNOSIS — Y92481 Parking lot as the place of occurrence of the external cause: Secondary | ICD-10-CM | POA: Diagnosis not present

## 2014-12-18 DIAGNOSIS — S3992XA Unspecified injury of lower back, initial encounter: Secondary | ICD-10-CM | POA: Diagnosis not present

## 2014-12-18 DIAGNOSIS — Y939 Activity, unspecified: Secondary | ICD-10-CM | POA: Insufficient documentation

## 2014-12-18 DIAGNOSIS — I10 Essential (primary) hypertension: Secondary | ICD-10-CM | POA: Diagnosis not present

## 2014-12-18 MED ORDER — IBUPROFEN 400 MG PO TABS
800.0000 mg | ORAL_TABLET | Freq: Once | ORAL | Status: AC
  Administered 2014-12-19: 800 mg via ORAL
  Filled 2014-12-18: qty 4
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
## 2014-12-19 ENCOUNTER — Emergency Department (HOSPITAL_COMMUNITY): Payer: Medicare Other

## 2014-12-19 DIAGNOSIS — S29001A Unspecified injury of muscle and tendon of front wall of thorax, initial encounter: Secondary | ICD-10-CM | POA: Diagnosis not present

## 2014-12-19 MED ORDER — TRAMADOL HCL 50 MG PO TABS: 50.0000 mg | ORAL_TABLET | Freq: Four times a day (QID) | ORAL | Status: DC | PRN

## 2014-12-19 MED ORDER — METHOCARBAMOL 500 MG PO TABS: 500.0000 mg | ORAL_TABLET | Freq: Two times a day (BID) | ORAL | Status: DC

## 2014-12-19 NOTE — Discharge Instructions (Signed)

## 2015-08-10 ENCOUNTER — Telehealth: Payer: Self-pay

## 2015-08-11 ENCOUNTER — Encounter: Payer: Self-pay | Admitting: Family Medicine

## 2015-08-11 ENCOUNTER — Ambulatory Visit (INDEPENDENT_AMBULATORY_CARE_PROVIDER_SITE_OTHER): Payer: Medicare Other | Admitting: Family Medicine

## 2015-08-11 VITALS — BP 137/88 | HR 66 | Temp 97.5°F | Ht 75.0 in | Wt 384.6 lb

## 2015-08-11 DIAGNOSIS — E785 Hyperlipidemia, unspecified: Secondary | ICD-10-CM

## 2015-08-11 DIAGNOSIS — R319 Hematuria, unspecified: Secondary | ICD-10-CM

## 2015-08-11 DIAGNOSIS — E1165 Type 2 diabetes mellitus with hyperglycemia: Secondary | ICD-10-CM | POA: Diagnosis not present

## 2015-08-11 DIAGNOSIS — F17210 Nicotine dependence, cigarettes, uncomplicated: Secondary | ICD-10-CM

## 2015-08-11 DIAGNOSIS — IMO0002 Reserved for concepts with insufficient information to code with codable children: Secondary | ICD-10-CM

## 2015-08-11 DIAGNOSIS — N521 Erectile dysfunction due to diseases classified elsewhere: Secondary | ICD-10-CM

## 2015-08-11 DIAGNOSIS — I10 Essential (primary) hypertension: Secondary | ICD-10-CM | POA: Diagnosis not present

## 2015-08-11 DIAGNOSIS — Z0000 Encounter for general adult medical examination without abnormal findings: Secondary | ICD-10-CM | POA: Insufficient documentation

## 2015-08-11 DIAGNOSIS — Z114 Encounter for screening for human immunodeficiency virus [HIV]: Secondary | ICD-10-CM

## 2015-08-11 DIAGNOSIS — A4902 Methicillin resistant Staphylococcus aureus infection, unspecified site: Secondary | ICD-10-CM

## 2015-08-11 DIAGNOSIS — E1151 Type 2 diabetes mellitus with diabetic peripheral angiopathy without gangrene: Secondary | ICD-10-CM | POA: Diagnosis not present

## 2015-08-11 LAB — POCT URINALYSIS DIPSTICK
Bilirubin, UA: NEGATIVE
Glucose, UA: NEGATIVE
Ketones, UA: NEGATIVE
Leukocytes, UA: NEGATIVE
Nitrite, UA: NEGATIVE
Spec Grav, UA: >=1.03
UROBILINOGEN UA: 0.2
pH, UA: 6

## 2015-08-11 MED ORDER — EZETIMIBE 10 MG PO TABS: 10.0000 mg | ORAL_TABLET | Freq: Every day | ORAL | Status: DC

## 2015-08-11 MED ORDER — VARENICLINE TARTRATE 0.5 MG X 11 & 1 MG X 42 PO MISC: ORAL | Status: DC

## 2015-08-11 MED ORDER — GLIPIZIDE-METFORMIN HCL 2.5-500 MG PO TABS
1.0000 | ORAL_TABLET | Freq: Two times a day (BID) | ORAL | Status: DC
Start: 2015-08-11 — End: 2015-08-18

## 2015-08-11 MED ORDER — SILDENAFIL CITRATE 20 MG PO TABS
20.0000 mg | ORAL_TABLET | Freq: Three times a day (TID) | ORAL | Status: DC
Start: 2015-08-11 — End: 2016-11-26

## 2015-08-11 MED ORDER — VARENICLINE TARTRATE 1 MG PO TABS: 1.0000 mg | ORAL_TABLET | Freq: Two times a day (BID) | ORAL | Status: DC

## 2015-08-11 MED ORDER — LISINOPRIL 20 MG PO TABS: 20.0000 mg | ORAL_TABLET | Freq: Every day | ORAL | Status: DC

## 2015-08-11 MED ORDER — ATORVASTATIN CALCIUM 40 MG PO TABS: 40.0000 mg | ORAL_TABLET | Freq: Every day | ORAL | Status: DC

## 2015-08-11 MED ORDER — DOXYCYCLINE HYCLATE 100 MG PO TABS: 100.0000 mg | ORAL_TABLET | Freq: Two times a day (BID) | ORAL | Status: DC

## 2015-08-11 NOTE — Assessment & Plan Note (Signed)
Glipizide/ metformin Check labs

## 2015-08-11 NOTE — Progress Notes (Signed)
Pre visit review using our clinic review tool, if applicable. No additional management support is needed unless otherwise documented below in the visit note. 

## 2015-08-11 NOTE — Patient Instructions (Addendum)
Preventive Care for Adults, Male A healthy lifestyle and preventive care can promote health and wellness. Preventive health guidelines for men include the following key practices:  A routine yearly physical is a good way to check with your health care provider about your health and preventative screening. It is a chance to share any concerns and updates on your health and to receive a thorough exam.  Visit your dentist for a routine exam and preventative care every 6 months. Brush your teeth twice a day and floss once a day. Good oral hygiene prevents tooth decay and gum disease.  The frequency of eye exams is based on your age, health, family medical history, use of contact lenses, and other factors. Follow your health care provider's recommendations for frequency of eye exams.  Eat a healthy diet. Foods such as vegetables, fruits, whole grains, low-fat dairy products, and lean protein foods contain the nutrients you need without too many calories. Decrease your intake of foods high in solid fats, added sugars, and salt. Eat the right amount of calories for you.Get information about a proper diet from your health care provider, if necessary.  Regular physical exercise is one of the most important things you can do for your health. Most adults should get at least 150 minutes of moderate-intensity exercise (any activity that increases your heart rate and causes you to sweat) each week. In addition, most adults need muscle-strengthening exercises on 2 or more days a week.  Maintain a healthy weight. The body mass index (BMI) is a screening tool to identify possible weight problems. It provides an estimate of body fat based on height and weight. Your health care provider can find your BMI and can help you achieve or maintain a healthy weight.For adults 20 years and older:  A BMI below 18.5 is considered underweight.  A BMI of 18.5 to 24.9 is normal.  A BMI of 25 to 29.9 is considered  overweight.  A BMI of 30 and above is considered obese.  Maintain normal blood lipids and cholesterol levels by exercising and minimizing your intake of saturated fat. Eat a balanced diet with plenty of fruit and vegetables. Blood tests for lipids and cholesterol should begin at age 20 and be repeated every 5 years. If your lipid or cholesterol levels are high, you are over 50, or you are at high risk for heart disease, you may need your cholesterol levels checked more frequently.Ongoing high lipid and cholesterol levels should be treated with medicines if diet and exercise are not working.  If you smoke, find out from your health care provider how to quit. If you do not use tobacco, do not start.  Lung cancer screening is recommended for adults aged 55-80 years who are at high risk for developing lung cancer because of a history of smoking. A yearly low-dose CT scan of the lungs is recommended for people who have at least a 30-pack-year history of smoking and are a current smoker or have quit within the past 15 years. A pack year of smoking is smoking an average of 1 pack of cigarettes a day for 1 year (for example: 1 pack a day for 30 years or 2 packs a day for 15 years). Yearly screening should continue until the smoker has stopped smoking for at least 15 years. Yearly screening should be stopped for people who develop a health problem that would prevent them from having lung cancer treatment.  If you choose to drink alcohol, do not have more   than 2 drinks per day. One drink is considered to be 12 ounces (355 mL) of beer, 5 ounces (148 mL) of wine, or 1.5 ounces (44 mL) of liquor.  Avoid use of street drugs. Do not share needles with anyone. Ask for help if you need support or instructions about stopping the use of drugs.  High blood pressure causes heart disease and increases the risk of stroke. Your blood pressure should be checked at least every 1-2 years. Ongoing high blood pressure should be  treated with medicines, if weight loss and exercise are not effective.  If you are 34-90 years old, ask your health care provider if you should take aspirin to prevent heart disease.  Diabetes screening is done by taking a blood sample to check your blood glucose level after you have not eaten for a certain period of time (fasting). If you are not overweight and you do not have risk factors for diabetes, you should be screened once every 3 years starting at age 35. If you are overweight or obese and you are 70-84 years of age, you should be screened for diabetes every year as part of your cardiovascular risk assessment.  Colorectal cancer can be detected and often prevented. Most routine colorectal cancer screening begins at the age of 18 and continues through age 69. However, your health care provider may recommend screening at an earlier age if you have risk factors for colon cancer. On a yearly basis, your health care provider may provide home test kits to check for hidden blood in the stool. Use of a small camera at the end of a tube to directly examine the colon (sigmoidoscopy or colonoscopy) can detect the earliest forms of colorectal cancer. Talk to your health care provider about this at age 71, when routine screening begins. Direct exam of the colon should be repeated every 5-10 years through age 18, unless early forms of precancerous polyps or small growths are found.  People who are at an increased risk for hepatitis B should be screened for this virus. You are considered at high risk for hepatitis B if:  You were born in a country where hepatitis B occurs often. Talk with your health care provider about which countries are considered high risk.  Your parents were born in a high-risk country and you have not received a shot to protect against hepatitis B (hepatitis B vaccine).  You have HIV or AIDS.  You use needles to inject street drugs.  You live with, or have sex with, someone who  has hepatitis B.  You are a man who has sex with other men (MSM).  You get hemodialysis treatment.  You take certain medicines for conditions such as cancer, organ transplantation, and autoimmune conditions.  Hepatitis C blood testing is recommended for all people born from [birth year] through 1965 and any individual with known risks for hepatitis C.  Practice safe sex. Use condoms and avoid high-risk sexual practices to reduce the spread of sexually transmitted infections (STIs). STIs include gonorrhea, chlamydia, syphilis, trichomonas, herpes, HPV, and human immunodeficiency virus (HIV). Herpes, HIV, and HPV are viral illnesses that have no cure. They can result in disability, cancer, and death.  If you are a man who has sex with other men, you should be screened at least once per year for:  HIV.  Urethral, rectal, and pharyngeal infection of gonorrhea, chlamydia, or both.  If you are at risk of being infected with HIV, it is recommended that you take a  prescription medicine daily to prevent HIV infection. This is called preexposure prophylaxis (PrEP). You are considered at risk if:  You are a man who has sex with other men (MSM) and have other risk factors.  You are a heterosexual man, are sexually active, and are at increased risk for HIV infection.  You take drugs by injection.  You are sexually active with a partner who has HIV.  Talk with your health care provider about whether you are at high risk of being infected with HIV. If you choose to begin PrEP, you should first be tested for HIV. You should then be tested every 3 months for as long as you are taking PrEP.  A one-time screening for abdominal aortic aneurysm (AAA) and surgical repair of large AAAs by ultrasound are recommended for men ages 44 to 66 years who are current or former smokers.  Healthy men should no longer receive prostate-specific antigen (PSA) blood tests as part of routine cancer screening. Talk with your health  care provider about prostate cancer screening.  Testicular cancer screening is not recommended for adult males who have no symptoms. Screening includes self-exam, a health care provider exam, and other screening tests. Consult with your health care provider about any symptoms you have or any concerns you have about testicular cancer.  Use sunscreen. Apply sunscreen liberally and repeatedly throughout the day. You should seek shade when your shadow is shorter than you. Protect yourself by wearing long sleeves, pants, a wide-brimmed hat, and sunglasses year round, whenever you are outdoors.  Once a month, do a whole-body skin exam, using a mirror to look at the skin on your back. Tell your health care provider about new moles, moles that have irregular borders, moles that are larger than a pencil eraser, or moles that have changed in shape or color.  Stay current with required vaccines (immunizations).  Influenza vaccine. All adults should be immunized every year.  Tetanus, diphtheria, and acellular pertussis (Td, Tdap) vaccine. An adult who has not previously received Tdap or who does not know his vaccine status should receive 1 dose of Tdap. This initial dose should be followed by tetanus and diphtheria toxoids (Td) booster doses every 10 years. Adults with an unknown or incomplete history of completing a 3-dose immunization series with Td-containing vaccines should begin or complete a primary immunization series including a Tdap dose. Adults should receive a Td booster every 10 years.  Varicella vaccine. An adult without evidence of immunity to varicella should receive 2 doses or a second dose if he has previously received 1 dose.  Human papillomavirus (HPV) vaccine. Males aged 11-21 years who have not received the vaccine previously should receive the 3-dose series. Males aged 22-26 years may be immunized. Immunization is recommended through the age of 23 years for any male who has sex with males  and did not get any or all doses earlier. Immunization is recommended for any person with an immunocompromised condition through the age of 72 years if he did not get any or all doses earlier. During the 3-dose series, the second dose should be obtained 4-8 weeks after the first dose. The third dose should be obtained 24 weeks after the first dose and 16 weeks after the second dose.  Zoster vaccine. One dose is recommended for adults aged 23 years or older unless certain conditions are present.  Measles, mumps, and rubella (MMR) vaccine. Adults born before [birth year] generally are considered immune to measles and mumps. Adults born in [birth year]  or later should have 1 or more doses of MMR vaccine unless there is a contraindication to the vaccine or there is laboratory evidence of immunity to each of the three diseases. A routine second dose of MMR vaccine should be obtained at least 28 days after the first dose for students attending postsecondary schools, health care workers, or international travelers. People who received inactivated measles vaccine or an unknown type of measles vaccine during 1963-1967 should receive 2 doses of MMR vaccine. People who received inactivated mumps vaccine or an unknown type of mumps vaccine before 1979 and are at high risk for mumps infection should consider immunization with 2 doses of MMR vaccine. Unvaccinated health care workers born before [birth year] who lack laboratory evidence of measles, mumps, or rubella immunity or laboratory confirmation of disease should consider measles and mumps immunization with 2 doses of MMR vaccine or rubella immunization with 1 dose of MMR vaccine.  Pneumococcal 13-valent conjugate (PCV13) vaccine. When indicated, a person who is uncertain of his immunization history and has no record of immunization should receive the PCV13 vaccine. All adults 9 years of age and older should receive this vaccine. An adult aged 69 years or older who has certain medical  conditions and has not been previously immunized should receive 1 dose of PCV13 vaccine. This PCV13 should be followed with a dose of pneumococcal polysaccharide (PPSV23) vaccine. Adults who are at high risk for pneumococcal disease should obtain the PPSV23 vaccine at least 8 weeks after the dose of PCV13 vaccine. Adults older than 46 years of age who have normal immune system function should obtain the PPSV23 vaccine dose at least 1 year after the dose of PCV13 vaccine.  Pneumococcal polysaccharide (PPSV23) vaccine. When PCV13 is also indicated, PCV13 should be obtained first. All adults aged 79 years and older should be immunized. An adult younger than age 43 years who has certain medical conditions should be immunized. Any person who resides in a nursing home or long-term care facility should be immunized. An adult smoker should be immunized. People with an immunocompromised condition and certain other conditions should receive both PCV13 and PPSV23 vaccines. People with human immunodeficiency virus (HIV) infection should be immunized as soon as possible after diagnosis. Immunization during chemotherapy or radiation therapy should be avoided. Routine use of PPSV23 vaccine is not recommended for American Indians, [place] Natives, or people younger than 65 years unless there are medical conditions that require PPSV23 vaccine. When indicated, people who have unknown immunization and have no record of immunization should receive PPSV23 vaccine. One-time revaccination 5 years after the first dose of PPSV23 is recommended for people aged 19-64 years who have chronic kidney failure, nephrotic syndrome, asplenia, or immunocompromised conditions. People who received 1-2 doses of PPSV23 before age 70 years should receive another dose of PPSV23 vaccine at age 79 years or later if at least 5 years have passed since the previous dose. Doses of PPSV23 are not needed for people immunized with PPSV23 at or after age 55  years.  Meningococcal vaccine. Adults with asplenia or persistent complement component deficiencies should receive 2 doses of quadrivalent meningococcal conjugate (MenACWY-D) vaccine. The doses should be obtained at least 2 months apart. Microbiologists working with certain meningococcal bacteria, [employer] recruits, people at risk during an outbreak, and people who travel to or live in countries with a high rate of meningitis should be immunized. A first-year college student up through age 64 years who is living in a residence hall should receive a  dose if he did not receive a dose on or after his 16th birthday. Adults who have certain high-risk conditions should receive one or more doses of vaccine.  Hepatitis A vaccine. Adults who wish to be protected from this disease, have chronic liver disease, work with hepatitis A-infected animals, work in hepatitis A research labs, or travel to or work in countries with a high rate of hepatitis A should be immunized. Adults who were previously unvaccinated and who anticipate close contact with an international adoptee during the first 60 days after arrival in the [place] from a country with a high rate of hepatitis A should be immunized.  Hepatitis B vaccine. Adults should be immunized if they wish to be protected from this disease, are under age 34 years and have diabetes, have chronic liver disease, have had more than one sex partner in the past 6 months, may be exposed to blood or other infectious body fluids, are household contacts or sex partners of hepatitis B positive people, are clients or workers in certain care facilities, or travel to or work in countries with a high rate of hepatitis B.  Haemophilus influenzae type b (Hib) vaccine. A previously unvaccinated person with asplenia or sickle cell disease or having a scheduled splenectomy should receive 1 dose of Hib vaccine. Regardless of previous immunization, a recipient of a hematopoietic stem cell  transplant should receive a 3-dose series 6-12 months after his successful transplant. Hib vaccine is not recommended for adults with HIV infection. Preventive Service / Frequency Ages 77 to 55  Blood pressure check.** / Every 3-5 years.  Lipid and cholesterol check.** / Every 5 years beginning at age 66.  Hepatitis C blood test.** / For any individual with known risks for hepatitis C.  Skin self-exam. / Monthly.  Influenza vaccine. / Every year.  Tetanus, diphtheria, and acellular pertussis (Tdap, Td) vaccine.** / Consult your health care provider. 1 dose of Td every 10 years.  Varicella vaccine.** / Consult your health care provider.  HPV vaccine. / 3 doses over 6 months, if 45 or younger.  Measles, mumps, rubella (MMR) vaccine.** / You need at least 1 dose of MMR if you were born in 1957 or later. You may also need a second dose.  Pneumococcal 13-valent conjugate (PCV13) vaccine.** / Consult your health care provider.  Pneumococcal polysaccharide (PPSV23) vaccine.** / 1 to 2 doses if you smoke cigarettes or if you have certain conditions.  Meningococcal vaccine.** / 1 dose if you are age 81 to 79 years and a Market researcher living in a residence hall, or have one of several medical conditions. You may also need additional booster doses.  Hepatitis A vaccine.** / Consult your health care provider.  Hepatitis B vaccine.** / Consult your health care provider.  Haemophilus influenzae type b (Hib) vaccine.** / Consult your health care provider. Ages 6 to 58  Blood pressure check.** / Every year.  Lipid and cholesterol check.** / Every 5 years beginning at age 89.  Lung cancer screening. / Every year if you are aged 84-80 years and have a 30-pack-year history of smoking and currently smoke or have quit within the past 15 years. Yearly screening is stopped once you have quit smoking for at least 15 years or develop a health problem that would prevent you from having  lung cancer treatment.  Fecal occult blood test (FOBT) of stool. / Every year beginning at age 90 and continuing until age 73. You may not have to do  this test if you get a colonoscopy every 10 years.  Flexible sigmoidoscopy** or colonoscopy.** / Every 5 years for a flexible sigmoidoscopy or every 10 years for a colonoscopy beginning at age 50 and continuing until age 75.  Hepatitis C blood test.** / For all people born from 1945 through 1965 and any individual with known risks for hepatitis C.  Skin self-exam. / Monthly.  Influenza vaccine. / Every year.  Tetanus, diphtheria, and acellular pertussis (Tdap/Td) vaccine.** / Consult your health care provider. 1 dose of Td every 10 years.  Varicella vaccine.** / Consult your health care provider.  Zoster vaccine.** / 1 dose for adults aged 60 years or older.  Measles, mumps, rubella (MMR) vaccine.** / You need at least 1 dose of MMR if you were born in 1957 or later. You may also need a second dose.  Pneumococcal 13-valent conjugate (PCV13) vaccine.** / Consult your health care provider.  Pneumococcal polysaccharide (PPSV23) vaccine.** / 1 to 2 doses if you smoke cigarettes or if you have certain conditions.  Meningococcal vaccine.** / Consult your health care provider.  Hepatitis A vaccine.** / Consult your health care provider.  Hepatitis B vaccine.** / Consult your health care provider.  Haemophilus influenzae type b (Hib) vaccine.** / Consult your health care provider. Ages 65 and over  Blood pressure check.** / Every year.  Lipid and cholesterol check.**/ Every 5 years beginning at age 20.  Lung cancer screening. / Every year if you are aged 55-80 years and have a 30-pack-year history of smoking and currently smoke or have quit within the past 15 years. Yearly screening is stopped once you have quit smoking for at least 15 years or develop a health problem that would prevent you from having lung cancer treatment.  Fecal  occult blood test (FOBT) of stool. / Every year beginning at age 50 and continuing until age 75. You may not have to do this test if you get a colonoscopy every 10 years.  Flexible sigmoidoscopy** or colonoscopy.** / Every 5 years for a flexible sigmoidoscopy or every 10 years for a colonoscopy beginning at age 50 and continuing until age 75.  Hepatitis C blood test.** / For all people born from 1945 through 1965 and any individual with known risks for hepatitis C.  Abdominal aortic aneurysm (AAA) screening.** / A one-time screening for ages 65 to 75 years who are current or former smokers.  Skin self-exam. / Monthly.  Influenza vaccine. / Every year.  Tetanus, diphtheria, and acellular pertussis (Tdap/Td) vaccine.** / 1 dose of Td every 10 years.  Varicella vaccine.** / Consult your health care provider.  Zoster vaccine.** / 1 dose for adults aged 60 years or older.  Pneumococcal 13-valent conjugate (PCV13) vaccine.** / 1 dose for all adults aged 65 years and older.  Pneumococcal polysaccharide (PPSV23) vaccine.** / 1 dose for all adults aged 65 years and older.  Meningococcal vaccine.** / Consult your health care provider.  Hepatitis A vaccine.** / Consult your health care provider.  Hepatitis B vaccine.** / Consult your health care provider.  Haemophilus influenzae type b (Hib) vaccine.** / Consult your health care provider. **Family history and personal history of risk and conditions may change your health care provider's recommendations.   This information is not intended to replace advice given to you by your health care provider. Make sure you discuss any questions you have with your health care provider.   Document Released: 11/08/2001 Document Revised: 10/03/2014 Document Reviewed: 02/07/2011 Elsevier Interactive Patient Education 2016   [place]. Smoking Cessation, Tips for Success If you are ready to quit smoking, congratulations! You have chosen to help yourself be  healthier. Cigarettes bring nicotine, tar, carbon monoxide, and other irritants into your body. Your lungs, heart, and blood vessels will be able to work better without these poisons. There are many different ways to quit smoking. Nicotine gum, nicotine patches, a nicotine inhaler, or nicotine nasal spray can help with physical craving. Hypnosis, support groups, and medicines help break the habit of smoking. WHAT THINGS CAN I DO TO MAKE QUITTING EASIER?  Here are some tips to help you quit for good:  Pick a date when you will quit smoking completely. Tell all of your friends and family about your plan to quit on that date.  Do not try to slowly cut down on the number of cigarettes you are smoking. Pick a quit date and quit smoking completely starting on that day.  Throw away all cigarettes.   Clean and remove all ashtrays from your home, work, and car.  On a card, write down your reasons for quitting. Carry the card with you and read it when you get the urge to smoke.  Cleanse your body of nicotine. Drink enough water and fluids to keep your urine clear or pale yellow. Do this after quitting to flush the nicotine from your body.  Learn to predict your moods. Do not let a bad situation be your excuse to have a cigarette. Some situations in your life might tempt you into wanting a cigarette.  Never have "just one" cigarette. It leads to wanting another and another. Remind yourself of your decision to quit.  Change habits associated with smoking. If you smoked while driving or when feeling stressed, try other activities to replace smoking. Stand up when drinking your coffee. Brush your teeth after eating. Sit in a different chair when you read the paper. Avoid alcohol while trying to quit, and try to drink fewer caffeinated beverages. Alcohol and caffeine may urge you to smoke.  Avoid foods and drinks that can trigger a desire to smoke, such as sugary or spicy foods and alcohol.  Ask people who  smoke not to smoke around you.  Have something planned to do right after eating or having a cup of coffee. For example, plan to take a walk or exercise.  Try a relaxation exercise to calm you down and decrease your stress. Remember, you may be tense and nervous for the first 2 weeks after you quit, but this will pass.  Find new activities to keep your hands busy. Play with a pen, coin, or rubber band. Doodle or draw things on paper.  Brush your teeth right after eating. This will help cut down on the craving for the taste of tobacco after meals. You can also try mouthwash.   Use oral substitutes in place of cigarettes. Try using lemon drops, carrots, cinnamon sticks, or chewing gum. Keep them handy so they are available when you have the urge to smoke.  When you have the urge to smoke, try deep breathing.  Designate your home as a nonsmoking area.  If you are a heavy smoker, ask your health care provider about a prescription for nicotine chewing gum. It can ease your withdrawal from nicotine.  Reward yourself. Set aside the cigarette money you save and buy yourself something nice.  Look for support from others. Join a support group or smoking cessation program. Ask someone at home or at work to help you  with your plan to quit smoking.  Always ask yourself, "Do I need this cigarette or is this just a reflex?" Tell yourself, "Today, I choose not to smoke," or "I do not want to smoke." You are reminding yourself of your decision to quit.  Do not replace cigarette smoking with electronic cigarettes (commonly called e-cigarettes). The safety of e-cigarettes is unknown, and some may contain harmful chemicals.  If you relapse, do not give up! Plan ahead and think about what you will do the next time you get the urge to smoke. HOW WILL I FEEL WHEN I QUIT SMOKING? You may have symptoms of withdrawal because your body is used to nicotine (the addictive substance in cigarettes). You may crave  cigarettes, be irritable, feel very hungry, cough often, get headaches, or have difficulty concentrating. The withdrawal symptoms are only temporary. They are strongest when you first quit but will go away within 10-14 days. When withdrawal symptoms occur, stay in control. Think about your reasons for quitting. Remind yourself that these are signs that your body is healing and getting used to being without cigarettes. Remember that withdrawal symptoms are easier to treat than the major diseases that smoking can cause.  Even after the withdrawal is over, expect periodic urges to smoke. However, these cravings are generally short lived and will go away whether you smoke or not. Do not smoke! WHAT RESOURCES ARE AVAILABLE TO HELP ME QUIT SMOKING? Your health care provider can direct you to community resources or hospitals for support, which may include:  Group support.  Education.  Hypnosis.  Therapy.   This information is not intended to replace advice given to you by your health care provider. Make sure you discuss any questions you have with your health care provider.   Document Released: 06/10/2004 Document Revised: 10/03/2014 Document Reviewed: 02/28/2013 Elsevier Interactive Patient Education [employer].

## 2015-08-11 NOTE — Assessment & Plan Note (Signed)
con't lisinopril Check labs

## 2015-08-11 NOTE — Assessment & Plan Note (Addendum)
See avs ghm utd Check labs Forms for [facility] filled out

## 2015-08-11 NOTE — Telephone Encounter (Signed)
Pre Visit call completed. 
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
## 2015-08-12 LAB — COMPREHENSIVE METABOLIC PANEL
ALBUMIN: 3.7 g/dL (ref 3.5–5.2)
ALT: 15 U/L (ref 0–53)
AST: 17 U/L (ref 0–37)
Alkaline Phosphatase: 71 U/L (ref 39–117)
BUN: 10 mg/dL (ref 6–23)
CALCIUM: 9.5 mg/dL (ref 8.4–10.5)
CHLORIDE: 104 meq/L (ref 96–112)
CO2: 26 mEq/L (ref 19–32)
Creatinine, Ser: 0.98 mg/dL (ref 0.40–1.50)
GFR: 87.5 mL/min (ref >60.00)
Glucose, Bld: 98 mg/dL (ref 70–99)
Potassium: 4.1 mEq/L (ref 3.5–5.1)
Sodium: 138 mEq/L (ref 135–145)
Total Bilirubin: 0.4 mg/dL (ref 0.2–1.2)
Total Protein: 7.9 g/dL (ref 6.0–8.3)

## 2015-08-12 LAB — CBC WITH DIFFERENTIAL/PLATELET
BASOS PCT: 0.8 % (ref 0.0–3.0)
Basophils Absolute: 0.1 10*3/uL (ref 0.0–0.1)
Eosinophils Absolute: 0.4 10*3/uL (ref 0.0–0.7)
Eosinophils Relative: 4 % (ref 0.0–5.0)
HEMATOCRIT: 42.4 % (ref 39.0–52.0)
HEMOGLOBIN: 13.8 g/dL (ref 13.0–17.0)
LYMPHS PCT: 23.8 % (ref 12.0–46.0)
Lymphs Abs: 2.7 10*3/uL (ref 0.7–4.0)
MCHC: 32.6 g/dL (ref 30.0–36.0)
MCV: 88.4 fl (ref 78.0–100.0)
MONOS PCT: 8.7 % (ref 3.0–12.0)
Monocytes Absolute: 1 10*3/uL (ref 0.1–1.0)
NEUTROS ABS: 7 10*3/uL (ref 1.4–7.7)
Neutrophils Relative %: 62.7 % (ref 43.0–77.0)
PLATELETS: 272 10*3/uL (ref 150.0–400.0)
RBC: 4.8 Mil/uL (ref 4.22–5.81)
RDW: 13.8 % (ref 11.5–15.5)
WBC: 11.2 10*3/uL — AB (ref 4.0–10.5)

## 2015-08-12 LAB — HIV ANTIBODY (ROUTINE TESTING W REFLEX): HIV: NONREACTIVE

## 2015-08-12 LAB — LDL CHOLESTEROL, DIRECT: LDL DIRECT: 116 mg/dL

## 2015-08-12 LAB — LIPID PANEL
CHOLESTEROL: 204 mg/dL — AB (ref 0–200)
HDL: 27.8 mg/dL — ABNORMAL LOW (ref >39.00)
NONHDL: 176.49
TRIGLYCERIDES: 286 mg/dL — AB (ref 0.0–149.0)
Total CHOL/HDL Ratio: 7
VLDL: 57.2 mg/dL — AB (ref 0.0–40.0)

## 2015-08-12 LAB — TSH: TSH: 4.94 u[IU]/mL — ABNORMAL HIGH (ref 0.35–4.50)

## 2015-08-12 LAB — URINE CULTURE
Colony Count: NO GROWTH
ORGANISM ID, BACTERIA: NO GROWTH

## 2015-08-12 LAB — PSA: PSA: 0.64 ng/mL (ref 0.10–4.00)

## 2015-08-12 LAB — HEMOGLOBIN A1C: Hgb A1c MFr Bld: 9.6 % — ABNORMAL HIGH (ref 4.6–6.5)

## 2015-08-18 ENCOUNTER — Telehealth: Payer: Self-pay | Admitting: Family Medicine

## 2015-08-18 DIAGNOSIS — R7989 Other specified abnormal findings of blood chemistry: Secondary | ICD-10-CM

## 2015-08-18 DIAGNOSIS — E1151 Type 2 diabetes mellitus with diabetic peripheral angiopathy without gangrene: Secondary | ICD-10-CM

## 2015-08-18 DIAGNOSIS — E1165 Type 2 diabetes mellitus with hyperglycemia: Principal | ICD-10-CM

## 2015-08-18 DIAGNOSIS — IMO0002 Reserved for concepts with insufficient information to code with codable children: Secondary | ICD-10-CM

## 2015-08-18 MED ORDER — FENOFIBRATE 160 MG PO TABS: 160.0000 mg | ORAL_TABLET | Freq: Every day | ORAL | Status: DC

## 2015-08-18 MED ORDER — ONETOUCH DELICA LANCETS FINE MISC: Status: DC

## 2015-08-18 MED ORDER — GLUCOSE BLOOD VI STRP: ORAL_STRIP | Status: DC

## 2015-08-18 MED ORDER — GLIPIZIDE-METFORMIN HCL 5-500 MG PO TABS: 1.0000 | ORAL_TABLET | Freq: Two times a day (BID) | ORAL | Status: DC

## 2015-08-18 NOTE — Telephone Encounter (Signed)
Rx faxed.    KP 

## 2015-08-18 NOTE — Telephone Encounter (Signed)
Caller name: Ruby  Relationship to patient: Self  Can be reached: [phone]  Pharmacy: [facility] [address]   Reason for call: Pt is requesting a Rx for his [facility] and test strips

## 2015-08-19 ENCOUNTER — Other Ambulatory Visit: Payer: Medicare Other

## 2015-08-31 ENCOUNTER — Telehealth: Payer: Self-pay | Admitting: Family Medicine

## 2015-08-31 NOTE — Telephone Encounter (Signed)
This form was given to Dr. Etter Sjogren during pt's visit on 08/11/15, per Dr. Ivy Lynn note. I have not received or seen this form. I looked for it atKkim's desk and did not find it. I do not know where form is. JG//CMA

## 2015-08-31 NOTE — Telephone Encounter (Signed)
Pt came in looking for his DMV form for his CPE. Showing in notes that form has been completed, however not showing it to be available to pt. Pt has to have form completed and sent to Eye provider for sign off as soon as possible as pt has to have back to his employer by 12/15. Please advise further.       [facility]: Gevena Cotton MD.   Phone: [phone] Fax : [phone]    Fu with pt at : [phone]  (cell)

## 2015-08-31 NOTE — Telephone Encounter (Signed)
Please check on status of form

## 2015-10-22 ENCOUNTER — Encounter (HOSPITAL_COMMUNITY): Payer: Self-pay | Admitting: Emergency Medicine

## 2015-10-22 ENCOUNTER — Emergency Department (HOSPITAL_COMMUNITY)
Admission: EM | Admit: 2015-10-22 | Discharge: 2015-10-22 | Disposition: A | Discharge status: Home / Self Care | Payer: Medicare Other | Attending: Emergency Medicine | Admitting: Emergency Medicine

## 2015-10-22 ENCOUNTER — Emergency Department (HOSPITAL_COMMUNITY): Payer: Medicare Other

## 2015-10-22 DIAGNOSIS — Y9372 Activity, wrestling: Secondary | ICD-10-CM | POA: Diagnosis not present

## 2015-10-22 DIAGNOSIS — M109 Gout, unspecified: Secondary | ICD-10-CM | POA: Insufficient documentation

## 2015-10-22 DIAGNOSIS — X501XXA Overexertion from prolonged static or awkward postures, initial encounter: Secondary | ICD-10-CM | POA: Insufficient documentation

## 2015-10-22 DIAGNOSIS — I10 Essential (primary) hypertension: Secondary | ICD-10-CM | POA: Diagnosis not present

## 2015-10-22 DIAGNOSIS — E119 Type 2 diabetes mellitus without complications: Secondary | ICD-10-CM | POA: Insufficient documentation

## 2015-10-22 DIAGNOSIS — Z8659 Personal history of other mental and behavioral disorders: Secondary | ICD-10-CM | POA: Diagnosis not present

## 2015-10-22 DIAGNOSIS — Z8669 Personal history of other diseases of the nervous system and sense organs: Secondary | ICD-10-CM | POA: Diagnosis not present

## 2015-10-22 DIAGNOSIS — Z7984 Long term (current) use of oral hypoglycemic drugs: Secondary | ICD-10-CM | POA: Insufficient documentation

## 2015-10-22 DIAGNOSIS — E785 Hyperlipidemia, unspecified: Secondary | ICD-10-CM | POA: Insufficient documentation

## 2015-10-22 DIAGNOSIS — E669 Obesity, unspecified: Secondary | ICD-10-CM | POA: Diagnosis not present

## 2015-10-22 DIAGNOSIS — Z8781 Personal history of (healed) traumatic fracture: Secondary | ICD-10-CM | POA: Insufficient documentation

## 2015-10-22 DIAGNOSIS — Z79899 Other long term (current) drug therapy: Secondary | ICD-10-CM | POA: Diagnosis not present

## 2015-10-22 DIAGNOSIS — Y9289 Other specified places as the place of occurrence of the external cause: Secondary | ICD-10-CM | POA: Insufficient documentation

## 2015-10-22 DIAGNOSIS — Y998 Other external cause status: Secondary | ICD-10-CM | POA: Diagnosis not present

## 2015-10-22 DIAGNOSIS — F17210 Nicotine dependence, cigarettes, uncomplicated: Secondary | ICD-10-CM | POA: Insufficient documentation

## 2015-10-22 DIAGNOSIS — Z792 Long term (current) use of antibiotics: Secondary | ICD-10-CM | POA: Insufficient documentation

## 2015-10-22 DIAGNOSIS — S8991XA Unspecified injury of right lower leg, initial encounter: Secondary | ICD-10-CM | POA: Diagnosis not present

## 2015-10-22 HISTORY — DX: Bell's palsy: G51.0

## 2015-10-22 MED ORDER — OXYCODONE-ACETAMINOPHEN 5-325 MG PO TABS
2.0000 | ORAL_TABLET | Freq: Once | ORAL | Status: AC
  Administered 2015-10-22: 2 via ORAL
  Filled 2015-10-22: qty 2

## 2015-10-22 MED ORDER — OXYCODONE-ACETAMINOPHEN 5-325 MG PO TABS: 1.0000 | ORAL_TABLET | ORAL | Status: DC | PRN

## 2015-10-22 NOTE — ED Notes (Signed)
Pt c/o right knee pain that started about 2 weeks ago wrestling with son. Had gotten better, but last night was awoken from sleep by returning pain.

## 2015-10-22 NOTE — ED Provider Notes (Signed)
CSN: [id number]     Arrival date & time 10/22/15  [id number] History   First MD Initiated Contact with Patient 10/22/15 [phone]     Chief Complaint  Patient presents with  . Knee Pain     (Consider location/radiation/quality/duration/timing/severity/associated sxs/prior Treatment) HPI Patient presents with 2 weeks of right knee pain. States he injured it while wrestling with son. States he twisted it and felt immediate pain. Since that time he's had intermittent sensation of the knee giving out. He woke this morning from sleep at 3 AM. He had a sharp pain in the right knee and was unable to flex the knee. Pain with any movement or twisting. No previous knee injuries or surgeries. Denies fever or chills. Denies swelling or warmth Past Medical History  Diagnosis Date  . Diabetes mellitus   . Obesity   . Hypertension   . Bipolar affective disorder ([place])   . Sleep apnea   . Gout   . Hyperlipidemia     Per pt - reported on 03/07/12  . Ankle fracture, right   . Bell's palsy    Past Surgical History  Procedure Laterality Date  . Keloid excision    . Vasectomy     Family History  Problem Relation Age of Onset  . Hypertension Mother   . Diabetes Mother   . Hyperlipidemia Mother   . Throat cancer      aunt  . Kidney failure    . Pancreatitis Father   . Arthritis Father   . Heart attack Brother     80  . Heart disease Brother     MI  . Heart attack Maternal Grandmother   . Cancer Maternal Grandmother     breast  . Kidney disease Sister     dialysis  . Lupus Sister   . Stroke Sister   . Diabetes Maternal Aunt   . Heart disease Maternal Aunt   . Diabetes Paternal Aunt   . Heart disease Paternal Aunt     pacemaker  . Heart disease Paternal Uncle     pacemaker  . Diabetes Paternal Grandmother   . Stroke Paternal Aunt   . COPD Maternal Aunt   . Heart disease Maternal Aunt    Social History  Substance Use Topics  . Smoking status: Current Every Day Smoker -- 0.50 packs/day for  25 years    Types: Cigarettes  . Smokeless tobacco: None  . Alcohol Use: No    Review of Systems  Constitutional: Negative for fever and chills.  Respiratory: Negative for shortness of breath.   Cardiovascular: Negative for chest pain.  Gastrointestinal: Negative for nausea, vomiting and abdominal pain.  Musculoskeletal: Positive for arthralgias and gait problem. Negative for myalgias.  Skin: Negative for rash and wound.  Neurological: Negative for dizziness, weakness, light-headedness, numbness and headaches.  All other systems reviewed and are negative.     Allergies  Cucumber extract  Home Medications   Prior to Admission medications   Medication Sig Start Date End Date Taking? Authorizing Provider  atorvastatin (LIPITOR) 40 MG tablet Take 1 tablet (40 mg total) by mouth daily. 08/11/15   Rosalita Chessman, DO  doxycycline (VIBRA-TABS) 100 MG tablet Take 1 tablet (100 mg total) by mouth 2 (two) times daily. 08/11/15   Rosalita Chessman, DO  ezetimibe (ZETIA) 10 MG tablet Take 1 tablet (10 mg total) by mouth daily. 08/11/15   Rosalita Chessman, DO  fenofibrate 160 MG tablet Take 1 tablet (160 mg total)  by mouth daily. 08/18/15   Rosalita Chessman, DO  glipiZIDE-metformin (METAGLIP) 5-500 MG tablet Take 1 tablet by mouth 2 (two) times daily before a meal. 08/18/15   Rosalita Chessman, DO  glucose blood (ONETOUCH VERIO) test strip Check blood sugar once daily. Dx: E11.9 08/18/15   Rosalita Chessman, DO  ibuprofen (ADVIL,MOTRIN) 800 MG tablet Take 1 tablet (800 mg total) by mouth 3 (three) times daily. 07/24/14   Britt Bottom, NP  lisinopril (PRINIVIL,ZESTRIL) 20 MG tablet Take 1 tablet (20 mg total) by mouth daily. 08/11/15   Rosalita Chessman, DO  ONETOUCH DELICA LANCETS FINE MISC Check blood sugar once daily. Dx: E11.9 08/18/15   Rosalita Chessman, DO  oxyCODONE-acetaminophen (PERCOCET) 5-325 MG tablet Take 1-2 tablets by mouth every 4 (four) hours as needed for severe pain. 10/22/15   Julianne Rice, MD  sildenafil (REVATIO) 20 MG tablet Take 1 tablet (20 mg total) by mouth 3 (three) times daily. 08/11/15   Rosalita Chessman, DO  varenicline (CHANTIX CONTINUING MONTH PAK) 1 MG tablet Take 1 tablet (1 mg total) by mouth 2 (two) times daily. 08/11/15   Yvonne R Lowne, DO   BP 146/98 mmHg  Pulse 57  Temp(Src) 98.2 F (36.8 C) (Oral)  Resp 20  SpO2 95% Physical Exam  Constitutional: He is oriented to person, place, and time. He appears well-developed and well-nourished. No distress.  HENT:  Head: Normocephalic and atraumatic.  Mouth/Throat: Oropharynx is clear and moist. No oropharyngeal exudate.  Eyes: EOM are normal. Pupils are equal, round, and reactive to light.  Neck: Normal range of motion. Neck supple.  Cardiovascular: Normal rate and regular rhythm.   Pulmonary/Chest: Effort normal and breath sounds normal. No respiratory distress. He has no wheezes. He has no rales. He exhibits no tenderness.  Abdominal: Soft. Bowel sounds are normal. He exhibits no distension and no mass. There is no tenderness. There is no rebound and no guarding.  Musculoskeletal: Normal range of motion. He exhibits no edema or tenderness.  Patient with no tenderness to palpation of the anterior, lateral, medial or posterior right knee. There is no posterior fullness. No warmth. Effusion is noted. Decreased range of motion due to pain. Questionable ligamentous laxity. 2+ distal pulses.   Neurological: He is alert and oriented to person, place, and time.  Sensation intact. 5/5 motor in all extremities though exam of right knee limited due to pain.  Skin: Skin is warm and dry. No rash noted. No erythema.  Psychiatric: He has a normal mood and affect. His behavior is normal.  Nursing note and vitals reviewed.   ED Course  Procedures (including critical care time) Labs Review Labs Reviewed - No data to display  Imaging Review Dg Knee Complete 4 Views Right  10/22/2015  CLINICAL DATA:  45 year old  who injured the right knee approximately 2 weeks ago while wrestling with his son. Pain subsided, but he developed recurrent severe right knee pain last night. Initial encounter. EXAM: RIGHT KNEE - COMPLETE 4+ VIEW COMPARISON:  MRI right knee 02/01/2013, 01/03/2007. Knee x-rays 01/16/2013. FINDINGS: No evidence of acute or subacute fracture or dislocation. Mild lateral compartment joint space narrowing, moderate patellofemoral compartment joint space narrowing, and minimal medial compartment joint space narrowing associated with hypertrophic spurring. The patellofemoral compartment joint space narrowing has worsened since the 2014 examination. Bone mineral density well-preserved. Benign bone island in the inferior patella as noted previously. Large joint effusion. IMPRESSION: 1. No acute osseous abnormality. 2. Large joint  effusion. 3. Osteoarthritis, predominantly involving the patellofemoral compartment, slightly progressive since 2014. Electronically Signed   By: Evangeline Dakin M.D.   On: 10/22/2015 08:19   I have personally reviewed and evaluated these images and lab results as part of my medical decision-making.   EKG Interpretation None      MDM   Final diagnoses:  Right knee injury, initial encounter   Patient with likely a meniscal injury versus ligamentous injury. Placed in knee immobilizer and will give follow-up with orthopedics. Low suspicion for infectious process.     Julianne Rice, MD 10/22/15 [phone]

## 2015-10-22 NOTE — Discharge Instructions (Signed)
Meniscus Tear °A meniscus tear is a knee injury in which a piece of the meniscus is torn. The meniscus is a thick, rubbery, wedge-shaped cartilage in the knee. Two menisci are located in each knee. They sit between the upper bone (femur) and lower bone (tibia) that make up the knee joint. Each meniscus acts as a shock absorber for the knee. °A torn meniscus is one of the most common types of knee injuries. This injury can range from mild to severe. Surgery may be needed for a severe tear. °CAUSES °This injury may be caused by any squatting, twisting, or pivoting movement. Sports-related injuries are the most common cause. These often occur from: °· Running and stopping suddenly. °· Changing direction. °· Being tackled or knocked off your feet. °As people get older, their meniscus gets thinner and weaker. In these people, tears can happen more easily, such as from climbing stairs.  °RISK FACTORS °This injury is more likely to happen to: °· People who play contact sports. °· Males. °· People who are 30-40 years of age. °SYMPTOMS  °Symptoms of this injury include: °· Knee pain, especially at the side of the knee joint. You may feel pain when the injury occurs, or you may only hear a pop and feel pain later. °· A feeling that your knee is clicking, catching, locking, or giving way. °· Not being able to fully bend or extend your knee. °· Bruising or swelling in your knee. °DIAGNOSIS  °This injury may be diagnosed based on your symptoms and a physical exam. The physical exam may include: °· Moving your knee in different ways. °· Feeling for tenderness. °· Listening for a clicking sound. °· Checking if your knee locks or catches. °You may also have tests, such as: °· X-rays. °· MRI. °· A procedure to look inside your knee with a narrow surgical telescope (arthroscopy). °You may be referred to a knee specialist (orthopedic surgeon). °TREATMENT  °Treatment for this injury depends on the severity of the tear. Treatment for a  mild tear may include: °· Rest. °· Medicine to reduce pain and swelling. This is usually a nonsteroidal anti-inflammatory drug (NSAID). °· A knee brace or an elastic sleeve or wrap. °· Using crutches or a walker to keep weight off your knee and to help you walk. °· Exercises to strengthen your knee (physical therapy). °You may need surgery if you have a severe tear or if other treatments are not working.  °HOME CARE INSTRUCTIONS °Managing Pain and Swelling °· Take over-the-counter and prescription medicines only as told by your health care provider. °· If directed, apply ice to the injured area: °¨ Put ice in a plastic bag. °¨ Place a towel between your skin and the bag. °¨ Leave the ice on for 20 minutes, 2-3 times per day. °· Raise (elevate) the injured area above the level of your heart while you are sitting or lying down. °Activity °· Do not use the injured limb to support your body weight until your health care provider says that you can. Use crutches or a walker as told by your health care provider. °· Return to your normal activities as told by your health care provider. Ask your health care provider what activities are safe for you. °· Perform range-of-motion exercises only as told by your health care provider. °· Begin doing exercises to strengthen your knee and leg muscles only as told by your health care provider. After you recover, your health care provider may recommend these exercises to   help prevent another injury. °General Instructions °· Use a knee brace or elastic wrap as told by your health care provider. °· Keep all follow-up visits as told by your health care provider. This is important. °SEEK MEDICAL CARE IF: °· You have a fever. °· Your knee becomes red, tender, or swollen. °· Your pain medicine is not helping. °· Your symptoms get worse or do not improve after 2 weeks of home care. °  °This information is not intended to replace advice given to you by your health care provider. Make sure you  discuss any questions you have with your health care provider. °  °Document Released: 12/03/2002 Document Revised: 06/03/2015 Document Reviewed: 01/05/2015 °Elsevier Interactive Patient Education ©2016 [employer] ° °

## 2015-10-22 NOTE — ED Notes (Signed)
Patient refused crutches states he has some at home.

## 2015-11-06 ENCOUNTER — Other Ambulatory Visit: Payer: Self-pay | Admitting: Orthopedic Surgery

## 2015-11-06 DIAGNOSIS — M25561 Pain in right knee: Secondary | ICD-10-CM

## 2015-11-14 ENCOUNTER — Ambulatory Visit
Admission: RE | Admit: 2015-11-14 | Discharge: 2015-11-14 | Disposition: A | Discharge status: Home / Self Care | Payer: Medicare Other | Source: Ambulatory Visit | Attending: Orthopedic Surgery | Admitting: Orthopedic Surgery

## 2015-11-14 DIAGNOSIS — M25561 Pain in right knee: Secondary | ICD-10-CM

## 2015-12-17 ENCOUNTER — Telehealth: Payer: Self-pay | Admitting: Family Medicine

## 2015-12-17 NOTE — Telephone Encounter (Signed)
Left message for patient to call and reschedule appointment scheduled for 02/09/2016 with Dr. Etter Sjogren

## 2015-12-18 ENCOUNTER — Encounter: Payer: Self-pay | Admitting: Family Medicine

## 2015-12-18 ENCOUNTER — Telehealth: Payer: Self-pay | Admitting: Family Medicine

## 2015-12-18 NOTE — Telephone Encounter (Signed)
Attempted to contact patient on 12/16/15 to inform him that Dr. Etter Sjogren will be out of the office on Feb 09, 2016 and his appointment needs to be rescheduled. Mailed letter on 3/24 and cancelled appointment

## 2015-12-20 ENCOUNTER — Encounter (HOSPITAL_COMMUNITY): Payer: Self-pay | Admitting: Emergency Medicine

## 2015-12-20 ENCOUNTER — Emergency Department (HOSPITAL_COMMUNITY)
Admission: EM | Admit: 2015-12-20 | Discharge: 2015-12-20 | Disposition: A | Discharge status: Home / Self Care | Payer: Medicare Other | Attending: Emergency Medicine | Admitting: Emergency Medicine

## 2015-12-20 ENCOUNTER — Emergency Department (HOSPITAL_COMMUNITY): Payer: Medicare Other

## 2015-12-20 DIAGNOSIS — R030 Elevated blood-pressure reading, without diagnosis of hypertension: Secondary | ICD-10-CM

## 2015-12-20 DIAGNOSIS — E119 Type 2 diabetes mellitus without complications: Secondary | ICD-10-CM | POA: Insufficient documentation

## 2015-12-20 DIAGNOSIS — E785 Hyperlipidemia, unspecified: Secondary | ICD-10-CM | POA: Diagnosis not present

## 2015-12-20 DIAGNOSIS — I10 Essential (primary) hypertension: Secondary | ICD-10-CM | POA: Diagnosis not present

## 2015-12-20 DIAGNOSIS — E669 Obesity, unspecified: Secondary | ICD-10-CM | POA: Insufficient documentation

## 2015-12-20 DIAGNOSIS — G510 Bell's palsy: Secondary | ICD-10-CM | POA: Diagnosis not present

## 2015-12-20 DIAGNOSIS — Z79899 Other long term (current) drug therapy: Secondary | ICD-10-CM | POA: Diagnosis not present

## 2015-12-20 DIAGNOSIS — Z792 Long term (current) use of antibiotics: Secondary | ICD-10-CM | POA: Insufficient documentation

## 2015-12-20 DIAGNOSIS — F17210 Nicotine dependence, cigarettes, uncomplicated: Secondary | ICD-10-CM | POA: Insufficient documentation

## 2015-12-20 DIAGNOSIS — R220 Localized swelling, mass and lump, head: Secondary | ICD-10-CM

## 2015-12-20 DIAGNOSIS — Z8781 Personal history of (healed) traumatic fracture: Secondary | ICD-10-CM | POA: Diagnosis not present

## 2015-12-20 DIAGNOSIS — IMO0001 Reserved for inherently not codable concepts without codable children: Secondary | ICD-10-CM

## 2015-12-20 LAB — DIFFERENTIAL
BASOS ABS: 0.1 10*3/uL (ref 0.0–0.1)
BASOS PCT: 1 %
EOS PCT: 3 %
Eosinophils Absolute: 0.3 10*3/uL (ref 0.0–0.7)
LYMPHS ABS: 2.5 10*3/uL (ref 0.7–4.0)
LYMPHS PCT: 26 %
Monocytes Absolute: 0.6 10*3/uL (ref 0.1–1.0)
Monocytes Relative: 6 %
NEUTROS ABS: 6.1 10*3/uL (ref 1.7–7.7)
NEUTROS PCT: 64 %

## 2015-12-20 LAB — I-STAT CHEM 8, ED
BUN: 13 mg/dL (ref 6–20)
CALCIUM ION: 1.11 mmol/L — AB (ref 1.12–1.23)
CHLORIDE: 103 mmol/L (ref 101–111)
Creatinine, Ser: 0.9 mg/dL (ref 0.61–1.24)
GLUCOSE: 186 mg/dL — AB (ref 65–99)
HCT: 46 % (ref 39.0–52.0)
HEMOGLOBIN: 15.6 g/dL (ref 13.0–17.0)
Potassium: 3.9 mmol/L (ref 3.5–5.1)
SODIUM: 139 mmol/L (ref 135–145)
TCO2: 23 mmol/L (ref 0–100)

## 2015-12-20 LAB — COMPREHENSIVE METABOLIC PANEL
ALK PHOS: 70 U/L (ref 38–126)
ALT: 14 U/L — ABNORMAL LOW (ref 17–63)
ANION GAP: 9 (ref 5–15)
AST: 18 U/L (ref 15–41)
Albumin: 3.2 g/dL — ABNORMAL LOW (ref 3.5–5.0)
BUN: 11 mg/dL (ref 6–20)
CALCIUM: 8.8 mg/dL — AB (ref 8.9–10.3)
CHLORIDE: 105 mmol/L (ref 101–111)
CO2: 23 mmol/L (ref 22–32)
Creatinine, Ser: 1.03 mg/dL (ref 0.61–1.24)
Glucose, Bld: 184 mg/dL — ABNORMAL HIGH (ref 65–99)
Potassium: 4 mmol/L (ref 3.5–5.1)
SODIUM: 137 mmol/L (ref 135–145)
Total Bilirubin: 0.6 mg/dL (ref 0.3–1.2)
Total Protein: 7.1 g/dL (ref 6.5–8.1)

## 2015-12-20 LAB — PROTIME-INR
INR: 1.02 (ref 0.00–1.49)
Prothrombin Time: 13.6 seconds (ref 11.6–15.2)

## 2015-12-20 LAB — CBC
HCT: 42.3 % (ref 39.0–52.0)
HEMOGLOBIN: 13.5 g/dL (ref 13.0–17.0)
MCH: 28.4 pg (ref 26.0–34.0)
MCHC: 31.9 g/dL (ref 30.0–36.0)
MCV: 88.9 fL (ref 78.0–100.0)
Platelets: 244 10*3/uL (ref 150–400)
RBC: 4.76 MIL/uL (ref 4.22–5.81)
RDW: 14.2 % (ref 11.5–15.5)
WBC: 9.5 10*3/uL (ref 4.0–10.5)

## 2015-12-20 LAB — APTT: APTT: 31 s (ref 24–37)

## 2015-12-20 LAB — I-STAT TROPONIN, ED: TROPONIN I, POC: 0 ng/mL (ref 0.00–0.08)

## 2015-12-20 MED ORDER — TRAMADOL HCL 50 MG PO TABS
50.0000 mg | ORAL_TABLET | Freq: Four times a day (QID) | ORAL | Status: DC | PRN
Start: 2015-12-20 — End: 2016-11-26

## 2015-12-20 MED ORDER — IOPAMIDOL (ISOVUE-300) INJECTION 61%
INTRAVENOUS | Status: AC
  Administered 2015-12-20: 75 mL
  Filled 2015-12-20: qty 75

## 2015-12-20 MED ORDER — PREDNISONE 10 MG PO TABS: ORAL_TABLET | ORAL | Status: DC

## 2015-12-20 NOTE — ED Notes (Signed)
Pt c/o right sided facial swelling onset last night with salty taste in his mouth. Pt has history bells palsy 5-6 years ago with constant droop to left side of face.

## 2015-12-20 NOTE — Discharge Instructions (Signed)
1. Medications: Take prednisone as directed, continue usual home medications 2. Treatment: rest, drink plenty of fluids 3. Follow Up: Please follow up with your primary doctor in 2-3 days for discussion of your diagnoses and further evaluation after today's visit; Please return to the ER for changes in your speech or motor ability, new or worsening symptoms, any additional concerns.

## 2015-12-20 NOTE — ED Notes (Signed)
Pt is in stable condition upon d/c and ambulates from ED. 

## 2015-12-20 NOTE — ED Notes (Signed)
Patient transported to CT 

## 2015-12-20 NOTE — ED Provider Notes (Signed)
CSN: [id number]     Arrival date & time 12/20/15  0906 History   First MD Initiated Contact with Patient 12/20/15 1122     Chief Complaint  Patient presents with  . Facial Swelling     (Consider location/radiation/quality/duration/timing/severity/associated sxs/prior Treatment) The history is provided by the patient and medical records. No language interpreter was used.   [name] is a 45 y.o. male  with a PMH of DM, HTN, HLD, sleep apnea, bipolar d/o, and prior bell's palsy who presents to the Emergency Department complaining of right-sided facial swelling. Patient states last night he noticed a salty taste in his mouth and shortly after developed right peri-oral tingling. This morning, he awoke with 6/10 sharp pain in ear, right neck, and around right eye. Endorses blurry vision at onset, but that has since resolved. Two motrin taken prior to arrival which improved swelling, but no relief of pain. Denies fever, recent illness, change in speech, chest pain, sob  Past Medical History  Diagnosis Date  . Diabetes mellitus   . Obesity   . Hypertension   . Bipolar affective disorder ([place])   . Sleep apnea   . Gout   . Hyperlipidemia     Per pt - reported on 03/07/12  . Ankle fracture, right   . Bell's palsy    Past Surgical History  Procedure Laterality Date  . Keloid excision    . Vasectomy     Family History  Problem Relation Age of Onset  . Hypertension Mother   . Diabetes Mother   . Hyperlipidemia Mother   . Throat cancer      aunt  . Kidney failure    . Pancreatitis Father   . Arthritis Father   . Heart attack Brother     96  . Heart disease Brother     MI  . Heart attack Maternal Grandmother   . Cancer Maternal Grandmother     breast  . Kidney disease Sister     dialysis  . Lupus Sister   . Stroke Sister   . Diabetes Maternal Aunt   . Heart disease Maternal Aunt   . Diabetes Paternal Aunt   . Heart disease Paternal Aunt     pacemaker  . Heart disease  Paternal Uncle     pacemaker  . Diabetes Paternal Grandmother   . Stroke Paternal Aunt   . COPD Maternal Aunt   . Heart disease Maternal Aunt    Social History  Substance Use Topics  . Smoking status: Current Every Day Smoker -- 0.50 packs/day for 25 years    Types: Cigarettes  . Smokeless tobacco: None  . Alcohol Use: No    Review of Systems  Constitutional: Negative for fever and chills.  HENT: Positive for facial swelling. Negative for congestion and trouble swallowing.   Eyes: Positive for visual disturbance (resolved).  Respiratory: Negative for cough, shortness of breath and wheezing.   Cardiovascular: Negative.   Gastrointestinal: Negative for nausea, vomiting and abdominal pain.  Genitourinary: Negative for dysuria.  Musculoskeletal: Negative for neck pain and neck stiffness.  Skin: Negative for rash.  Neurological: Positive for headaches. Negative for dizziness and weakness.      Allergies  Cucumber extract  Home Medications   Prior to Admission medications   Medication Sig Start Date End Date Taking? Authorizing Provider  atorvastatin (LIPITOR) 40 MG tablet Take 1 tablet (40 mg total) by mouth daily. 08/11/15  Yes Rosalita Chessman, DO  ezetimibe (ZETIA) 10  MG tablet Take 1 tablet (10 mg total) by mouth daily. 08/11/15  Yes Yvonne R Lowne, DO  fenofibrate 160 MG tablet Take 1 tablet (160 mg total) by mouth daily. 08/18/15  Yes Alferd Apa Lowne, DO  glipiZIDE-metformin (METAGLIP) 5-500 MG tablet Take 1 tablet by mouth 2 (two) times daily before a meal. 08/18/15  Yes Yvonne R Lowne, DO  glucose blood (ONETOUCH VERIO) test strip Check blood sugar once daily. Dx: E11.9 08/18/15  Yes Yvonne R Lowne, DO  ibuprofen (ADVIL,MOTRIN) 800 MG tablet Take 1 tablet (800 mg total) by mouth 3 (three) times daily. 07/24/14  Yes Britt Bottom, NP  lisinopril (PRINIVIL,ZESTRIL) 20 MG tablet Take 1 tablet (20 mg total) by mouth daily. 08/11/15  Yes Rosalita Chessman, DO  ONETOUCH DELICA  LANCETS FINE MISC Check blood sugar once daily. Dx: E11.9 08/18/15  Yes Rosalita Chessman, DO  doxycycline (VIBRA-TABS) 100 MG tablet Take 1 tablet (100 mg total) by mouth 2 (two) times daily. 08/11/15   Rosalita Chessman, DO  oxyCODONE-acetaminophen (PERCOCET) 5-325 MG tablet Take 1-2 tablets by mouth every 4 (four) hours as needed for severe pain. 10/22/15   Julianne Rice, MD  predniSONE (DELTASONE) 10 MG tablet Take 60mg  (6 tablets) by mouth daily for the first 5 days, then 50 mg (5 tablets) for one day, then 40 mg (4 tablets) for one day, then 30mg  (3 tablets) for one day, then 20mg  (2 tablets) for one day, then 10 mg (one tablet) the last day. -- total treatment length of 10 days. 12/20/15   Ozella Almond Santanna Whitford, PA-C  sildenafil (REVATIO) 20 MG tablet Take 1 tablet (20 mg total) by mouth 3 (three) times daily. 08/11/15   Rosalita Chessman, DO  traMADol (ULTRAM) 50 MG tablet Take 1 tablet (50 mg total) by mouth every 6 (six) hours as needed. 12/20/15   Ozella Almond Barnabas Henriques, PA-C  varenicline (CHANTIX CONTINUING MONTH PAK) 1 MG tablet Take 1 tablet (1 mg total) by mouth 2 (two) times daily. 08/11/15   Yvonne R Lowne, DO   BP 157/101 mmHg  Pulse 52  Temp(Src) 97.8 F (36.6 C) (Oral)  Resp 18  Ht 6\' 3"  (1.905 m)  Wt 163.295 kg  BMI 45.00 kg/m2  SpO2 94% Physical Exam  Constitutional: He is oriented to person, place, and time. He appears well-developed and well-nourished.  Alert and in no acute distress  HENT:  Airway patent. Dentition normal without signs of infection. OP clear and moist. Uvula midline. No tongue or throat swelling. No ttp of sublingual space.  Cardiovascular: Normal rate, regular rhythm, normal heart sounds and intact distal pulses.  Exam reveals no gallop and no friction rub.   No murmur heard. Pulmonary/Chest: Effort normal and breath sounds normal. No respiratory distress. He has no wheezes. He has no rales. He exhibits no tenderness.  Abdominal: Soft. Bowel sounds are normal. He  exhibits no distension and no mass. There is no tenderness. There is no rebound and no guarding.  Musculoskeletal: He exhibits no edema.  Neurological: He is alert and oriented to person, place, and time.  Alert, oriented, thought content appropriate, able to give a coherent history. Speech is slurred - given left-sided bells palsy, unsure of change from baseline. Patient states no change in baseline speech. Able to follow commands.  Cranial Nerves:  II:  Peripheral visual fields grossly normal, pupils equal, round, reactive to light III, IV, VI: EOM intact bilaterally; left ptosis which pt. States is baseline 2/2 bell's  palsy 5-6 years ago. V,VII: facial droop; unable to keep right eye closed against resistance; facial light touch sensation equal VIII: hearing grossly normal IX, X: symmetric soft palate movement, uvula elevates symmetrically  XI: bilateral shoulder shrug symmetric and strong XII: midline tongue extension 5/5 muscle strength in upper and lower extremities bilaterally including strong and equal grip strength and dorsiflexion/plantar flexion Sensory to light touch normal in all four extremities.  Normal finger-to-nose and rapid alternating movements.  Skin: Skin is warm and dry.  Nursing note and vitals reviewed.   ED Course  Procedures (including critical care time) Labs Review Labs Reviewed  COMPREHENSIVE METABOLIC PANEL - Abnormal; Notable for the following:    Glucose, Bld 184 (*)    Calcium 8.8 (*)    Albumin 3.2 (*)    ALT 14 (*)    All other components within normal limits  I-STAT CHEM 8, ED - Abnormal; Notable for the following:    Glucose, Bld 186 (*)    Calcium, Ion 1.11 (*)    All other components within normal limits  PROTIME-INR  APTT  CBC  DIFFERENTIAL  I-STAT TROPOININ, ED  CBG MONITORING, ED    Imaging Review Ct Maxillofacial W/cm  12/20/2015  CLINICAL DATA:  45 year old male with left facial swelling since last night. Initial encounter. EXAM:  CT MAXILLOFACIAL WITH CONTRAST TECHNIQUE: Multidetector CT imaging of the maxillofacial structures was performed with intravenous contrast. Multiplanar CT image reconstructions were also generated. A small metallic BB was placed on the right temple in order to reliably differentiate right from left. CONTRAST:  1 ISOVUE-300 IOPAMIDOL (ISOVUE-300) INJECTION 61% COMPARISON:  None. FINDINGS: No abnormality identified in the visualized brain parenchyma. Major vascular structures in the visible neck and at the skullbase appear patent. Dominant left vertebral artery. Negative visualized larynx, pharynx, parapharyngeal spaces, retropharyngeal space, sublingual space, submandibular glands, and parotid glands. No cervical lymphadenopathy. Bilateral orbit and scalp soft tissues appear within normal limits. No fluid collection identified. Paranasal sinuses are clear aside from a small mucous retention cyst in the anterior inferior left maxillary sinus. Tympanic cavities and mastoids are clear. Left stylomastoid foramen appears normal. Occasional dental caries. Mandible intact. Maxilla and orbital walls intact. Zygoma and skullbase intact. Visualized cervical spine intact. Chronic disc and endplate degeneration at C5-C6. IMPRESSION: No acute or inflammatory findings identified. Occasional dental caries. Electronically Signed   By: Genevie Ann M.D.   On: 12/20/2015 13:49   I have personally reviewed and evaluated these images and lab results as part of my medical decision-making.   EKG Interpretation None      MDM   Final diagnoses:  Facial swelling  Bell's palsy  Elevated blood pressure   ABRIEL SILVEY presents with right sided facial pain and swelling x 1 day. Patient has history of Bell's palsy with deficits on left x 5-6 years making examination difficult for comparison. On exam, patient with right-sided facial swelling. Unable to keep right eye closed tight against resistance. 5/5 muscle strength in all 4  extremities. Exam findings more consistent with Bell's palsy on right, however will obtain imaging to further assess.   Labs: CBC, CMP, PT/INR, aptt, troponin reviewed and reassuring.  Imaging: CT maxillofacial shows no acute or inflammatory findings.   Plan:  Steroid taper, PCP follow up - discussed elevated BP as well. Return precautions given. All questions answered  Patient seen by and discussed with Dr. Regenia Skeeter who agrees with treatment plan.   [facility] Stehanie Ekstrom, PA-C 12/20/15 1421  Scott  Regenia Skeeter, MD 12/20/15 [phone]

## 2016-01-01 ENCOUNTER — Emergency Department (HOSPITAL_COMMUNITY)
Admission: EM | Admit: 2016-01-01 | Discharge: 2016-01-01 | Disposition: A | Discharge status: Home / Self Care | Payer: Medicare Other | Attending: Emergency Medicine | Admitting: Emergency Medicine

## 2016-01-01 ENCOUNTER — Encounter (HOSPITAL_COMMUNITY): Payer: Self-pay | Admitting: *Deleted

## 2016-01-01 DIAGNOSIS — F17210 Nicotine dependence, cigarettes, uncomplicated: Secondary | ICD-10-CM | POA: Diagnosis not present

## 2016-01-01 DIAGNOSIS — E119 Type 2 diabetes mellitus without complications: Secondary | ICD-10-CM | POA: Diagnosis not present

## 2016-01-01 DIAGNOSIS — I10 Essential (primary) hypertension: Secondary | ICD-10-CM | POA: Diagnosis not present

## 2016-01-01 DIAGNOSIS — Y9389 Activity, other specified: Secondary | ICD-10-CM | POA: Diagnosis not present

## 2016-01-01 DIAGNOSIS — Y998 Other external cause status: Secondary | ICD-10-CM | POA: Diagnosis not present

## 2016-01-01 DIAGNOSIS — G510 Bell's palsy: Secondary | ICD-10-CM | POA: Insufficient documentation

## 2016-01-01 DIAGNOSIS — Y9289 Other specified places as the place of occurrence of the external cause: Secondary | ICD-10-CM | POA: Diagnosis not present

## 2016-01-01 DIAGNOSIS — E669 Obesity, unspecified: Secondary | ICD-10-CM | POA: Diagnosis not present

## 2016-01-01 DIAGNOSIS — S8991XA Unspecified injury of right lower leg, initial encounter: Secondary | ICD-10-CM | POA: Diagnosis not present

## 2016-01-01 DIAGNOSIS — S79911A Unspecified injury of right hip, initial encounter: Secondary | ICD-10-CM | POA: Insufficient documentation

## 2016-01-01 DIAGNOSIS — W172XXA Fall into hole, initial encounter: Secondary | ICD-10-CM | POA: Insufficient documentation

## 2016-01-01 NOTE — ED Notes (Signed)
The pt fell in a hole earlier today he is c/o rt hip pain rt lower leg pain and he has a cut to his rt  Leg.  He has bi-lateral  Bells palsy

## 2016-01-01 NOTE — ED Notes (Signed)
Patient left ED at this time.

## 2016-01-04 ENCOUNTER — Emergency Department (HOSPITAL_COMMUNITY): Payer: Medicare Other

## 2016-01-04 ENCOUNTER — Emergency Department (HOSPITAL_COMMUNITY)
Admission: EM | Admit: 2016-01-04 | Discharge: 2016-01-05 | Disposition: A | Discharge status: Home / Self Care | Payer: Medicare Other | Attending: Emergency Medicine | Admitting: Emergency Medicine

## 2016-01-04 ENCOUNTER — Encounter (HOSPITAL_COMMUNITY): Payer: Self-pay | Admitting: Emergency Medicine

## 2016-01-04 DIAGNOSIS — E669 Obesity, unspecified: Secondary | ICD-10-CM | POA: Insufficient documentation

## 2016-01-04 DIAGNOSIS — F17210 Nicotine dependence, cigarettes, uncomplicated: Secondary | ICD-10-CM | POA: Diagnosis not present

## 2016-01-04 DIAGNOSIS — E785 Hyperlipidemia, unspecified: Secondary | ICD-10-CM | POA: Insufficient documentation

## 2016-01-04 DIAGNOSIS — W01198A Fall on same level from slipping, tripping and stumbling with subsequent striking against other object, initial encounter: Secondary | ICD-10-CM | POA: Diagnosis not present

## 2016-01-04 DIAGNOSIS — S80811A Abrasion, right lower leg, initial encounter: Secondary | ICD-10-CM | POA: Diagnosis not present

## 2016-01-04 DIAGNOSIS — Z8739 Personal history of other diseases of the musculoskeletal system and connective tissue: Secondary | ICD-10-CM | POA: Insufficient documentation

## 2016-01-04 DIAGNOSIS — E119 Type 2 diabetes mellitus without complications: Secondary | ICD-10-CM | POA: Diagnosis not present

## 2016-01-04 DIAGNOSIS — Z8669 Personal history of other diseases of the nervous system and sense organs: Secondary | ICD-10-CM | POA: Insufficient documentation

## 2016-01-04 DIAGNOSIS — S8011XA Contusion of right lower leg, initial encounter: Secondary | ICD-10-CM | POA: Diagnosis not present

## 2016-01-04 DIAGNOSIS — Y998 Other external cause status: Secondary | ICD-10-CM | POA: Insufficient documentation

## 2016-01-04 DIAGNOSIS — S8991XA Unspecified injury of right lower leg, initial encounter: Secondary | ICD-10-CM | POA: Diagnosis present

## 2016-01-04 DIAGNOSIS — Y9389 Activity, other specified: Secondary | ICD-10-CM | POA: Insufficient documentation

## 2016-01-04 DIAGNOSIS — I10 Essential (primary) hypertension: Secondary | ICD-10-CM | POA: Diagnosis not present

## 2016-01-04 DIAGNOSIS — Y9289 Other specified places as the place of occurrence of the external cause: Secondary | ICD-10-CM | POA: Diagnosis not present

## 2016-01-04 DIAGNOSIS — Z8781 Personal history of (healed) traumatic fracture: Secondary | ICD-10-CM | POA: Diagnosis not present

## 2016-01-04 DIAGNOSIS — Z299 Encounter for prophylactic measures, unspecified: Secondary | ICD-10-CM

## 2016-01-04 DIAGNOSIS — Z79899 Other long term (current) drug therapy: Secondary | ICD-10-CM | POA: Diagnosis not present

## 2016-01-04 NOTE — ED Notes (Signed)
Pt states that on Friday he was helping his mom work on her floor and he fell through her floor. Pt states that his right leg is hurting him and is bruised. Pt states he fell about five feet, got stuck  Between some pipes and used his upper body to crawl out. Pt states the swelling has gone down a great deal since Friday, but states he now has a burning feeling in the area.  Pt has a history of Bell's palsy.

## 2016-01-05 ENCOUNTER — Ambulatory Visit (HOSPITAL_BASED_OUTPATIENT_CLINIC_OR_DEPARTMENT_OTHER)
Admission: RE | Admit: 2016-01-05 | Discharge: 2016-01-05 | Disposition: A | Discharge status: Home / Self Care | Payer: Medicare Other | Source: Ambulatory Visit | Attending: Emergency Medicine | Admitting: Emergency Medicine

## 2016-01-05 DIAGNOSIS — M7989 Other specified soft tissue disorders: Secondary | ICD-10-CM | POA: Diagnosis not present

## 2016-01-05 DIAGNOSIS — M79609 Pain in unspecified limb: Secondary | ICD-10-CM | POA: Diagnosis not present

## 2016-01-05 MED ORDER — OXYCODONE-ACETAMINOPHEN 5-325 MG PO TABS
1.0000 | ORAL_TABLET | Freq: Once | ORAL | Status: AC
Start: 2016-01-05 — End: 2016-01-05
  Administered 2016-01-05: 1 via ORAL
  Filled 2016-01-05: qty 1

## 2016-01-05 MED ORDER — OXYCODONE-ACETAMINOPHEN 5-325 MG PO TABS: 1.0000 | ORAL_TABLET | Freq: Four times a day (QID) | ORAL | Status: DC | PRN

## 2016-01-05 MED ORDER — ENOXAPARIN SODIUM 100 MG/ML ~~LOC~~ SOLN
90.0000 mg | Freq: Once | SUBCUTANEOUS | Status: AC
  Administered 2016-01-05: 90 mg via SUBCUTANEOUS
  Filled 2016-01-05: qty 1

## 2016-01-05 NOTE — ED Provider Notes (Signed)
CSN: [id number]     Arrival date & time 01/04/16  2257 History   First MD Initiated Contact with Patient 01/05/16 0129     Chief Complaint  Patient presents with  . Leg Injury     (Consider location/radiation/quality/duration/timing/severity/associated sxs/prior Treatment) HPI Comments: Patient states he was helping his mother when he fell through the floor, catching himself with his arms.  He did hit his right shin in several areas and sustained bruises and abrasions.  He was doing well until last night when he decided to walk down some steps and had increased pain in his right.  The as well as a burning sensation in the area.  He took Tylenol yesterday afternoon at 2:00, and although he has prescriptions for Percocet at home.  He did not take any rather coming to the emergency room for evaluation.  He denies any other injury.  Denies any shortness of breath or tachycardia  The history is provided by the patient.    Past Medical History  Diagnosis Date  . Diabetes mellitus   . Obesity   . Hypertension   . Bipolar affective disorder ([place])   . Sleep apnea   . Gout   . Hyperlipidemia     Per pt - reported on 03/07/12  . Ankle fracture, right   . Bell's palsy    Past Surgical History  Procedure Laterality Date  . Keloid excision    . Vasectomy     Family History  Problem Relation Age of Onset  . Hypertension Mother   . Diabetes Mother   . Hyperlipidemia Mother   . Throat cancer      aunt  . Kidney failure    . Pancreatitis Father   . Arthritis Father   . Heart attack Brother     18  . Heart disease Brother     MI  . Heart attack Maternal Grandmother   . Cancer Maternal Grandmother     breast  . Kidney disease Sister     dialysis  . Lupus Sister   . Stroke Sister   . Diabetes Maternal Aunt   . Heart disease Maternal Aunt   . Diabetes Paternal Aunt   . Heart disease Paternal Aunt     pacemaker  . Heart disease Paternal Uncle     pacemaker  . Diabetes Paternal  Grandmother   . Stroke Paternal Aunt   . COPD Maternal Aunt   . Heart disease Maternal Aunt    Social History  Substance Use Topics  . Smoking status: Current Every Day Smoker -- 0.50 packs/day for 25 years    Types: Cigarettes  . Smokeless tobacco: None  . Alcohol Use: No    Review of Systems  Constitutional: Negative for fever and chills.  Respiratory: Negative for shortness of breath.   Cardiovascular: Positive for leg swelling. Negative for chest pain.  Gastrointestinal: Negative for nausea.  Musculoskeletal: Positive for arthralgias and gait problem. Negative for joint swelling.  Neurological: Negative for dizziness, numbness and headaches.  All other systems reviewed and are negative.     Allergies  Cucumber extract  Home Medications   Prior to Admission medications   Medication Sig Start Date End Date Taking? Authorizing Provider  atorvastatin (LIPITOR) 40 MG tablet Take 1 tablet (40 mg total) by mouth daily. 08/11/15  Yes Yvonne R Lowne Chase, DO  fenofibrate 160 MG tablet Take 1 tablet (160 mg total) by mouth daily. 08/18/15  Yes Ann Held, DO  gabapentin (NEURONTIN) 300 MG capsule Take 300 mg by mouth 3 (three) times daily.   Yes Historical Provider, MD  glipiZIDE-metformin (METAGLIP) 5-500 MG tablet Take 1 tablet by mouth 2 (two) times daily before a meal. 08/18/15  Yes Yvonne R Lowne Chase, DO  lisinopril (PRINIVIL,ZESTRIL) 20 MG tablet Take 1 tablet (20 mg total) by mouth daily. 08/11/15  Yes Rosalita Chessman Chase, DO  traMADol (ULTRAM) 50 MG tablet Take 1 tablet (50 mg total) by mouth every 6 (six) hours as needed. 12/20/15  Yes Jaime Pilcher Ward, PA-C  doxycycline (VIBRA-TABS) 100 MG tablet Take 1 tablet (100 mg total) by mouth 2 (two) times daily. Patient not taking: Reported on 01/05/2016 08/11/15   Rosalita Chessman Chase, DO  ezetimibe (ZETIA) 10 MG tablet Take 1 tablet (10 mg total) by mouth daily. Patient not taking: Reported on 01/05/2016 08/11/15    Rosalita Chessman Chase, DO  glucose blood (ONETOUCH VERIO) test strip Check blood sugar once daily. Dx: E11.9 08/18/15   Rosalita Chessman Chase, DO  ibuprofen (ADVIL,MOTRIN) 800 MG tablet Take 1 tablet (800 mg total) by mouth 3 (three) times daily. Patient not taking: Reported on 01/05/2016 07/24/14   Britt Bottom, NP  [facility] DELICA LANCETS FINE MISC Check blood sugar once daily. Dx: E11.9 08/18/15   Rosalita Chessman Chase, DO  oxyCODONE-acetaminophen (PERCOCET/ROXICET) 5-325 MG tablet Take 1 tablet by mouth every 6 (six) hours as needed for severe pain. 01/05/16   Junius Creamer, NP  predniSONE (DELTASONE) 10 MG tablet Take 60mg  (6 tablets) by mouth daily for the first 5 days, then 50 mg (5 tablets) for one day, then 40 mg (4 tablets) for one day, then 30mg  (3 tablets) for one day, then 20mg  (2 tablets) for one day, then 10 mg (one tablet) the last day. -- total treatment length of 10 days. Patient not taking: Reported on 01/05/2016 12/20/15   [facility] Ward, PA-C  sildenafil (REVATIO) 20 MG tablet Take 1 tablet (20 mg total) by mouth 3 (three) times daily. Patient not taking: Reported on 01/05/2016 08/11/15   Rosalita Chessman Chase, DO  varenicline (CHANTIX CONTINUING MONTH PAK) 1 MG tablet Take 1 tablet (1 mg total) by mouth 2 (two) times daily. Patient not taking: Reported on 01/05/2016 08/11/15   Alferd Apa Lowne Chase, DO   BP 129/94 mmHg  Pulse 64  Temp(Src) 98.7 F (37.1 C) (Oral)  Resp 18  Ht 6\' 3"  (1.905 m)  Wt 173.728 kg  BMI 47.87 kg/m2  SpO2 96% Physical Exam  Constitutional: He appears well-developed and well-nourished.  HENT:  Head: Normocephalic.  Eyes: Pupils are equal, round, and reactive to light.  Neck: Normal range of motion.  Cardiovascular: Normal rate and regular rhythm.   Pulmonary/Chest: Effort normal and breath sounds normal.  Musculoskeletal: Normal range of motion. He exhibits tenderness. He exhibits no edema.       Legs: Patient's shin/calf is soft.  He does  have some bruising to the anterior shin as well as to abrasions  Neurological: He is alert.  Skin: Skin is warm.  Nursing note and vitals reviewed.   ED Course  Procedures (including critical care time) Labs Review Labs Reviewed - No data to display  Imaging Review Dg Knee Complete 4 Views Right  01/04/2016  CLINICAL DATA:  Pain and swelling after falling through floor 4 days prior EXAM: RIGHT KNEE - COMPLETE 4+ VIEW COMPARISON:  Right knee radiographs October 22, 2015; right knee MRI November 14, 2015 FINDINGS: Frontal, lateral, and bilateral oblique views were obtained. There is no demonstrable fracture or dislocation. There is a moderate joint effusion, however. There is spurring medially. There is also spurring in the patellofemoral joint region. No erosive change. IMPRESSION: Moderate joint effusion. No fracture or dislocation. Stable osteoarthritic change. Electronically Signed   By: Lowella Grip III M.D.   On: 01/04/2016 23:52   I have personally reviewed and evaluated these images and lab results as part of my medical decision-making.   EKG Interpretation None     X-ray is negative for any fractures.  He does have a moderate joint effusion.  Patient's complaint and time since accident.  He is at increased risk for DVT.  I doubt this is compartment syndrome as the Muscle is not tense.  I discussed with patient risk for DVT.  He is willing to come back in the morning for a Doppler study.  He'll be given an injection of Lovenox.  He states that the pain is significantly better after the Percocet.  He'll be given, given a prescription for same MDM   Final diagnoses:  Contusion of leg, multiple sites, right, initial encounter  DVT prophylaxis         Junius Creamer, NP 01/05/16 0150  Junius Creamer, NP [id number] [phone]  Delora Fuel, MD [id number]

## 2016-01-05 NOTE — Discharge Instructions (Signed)
You have been given an injection of Lovenox, which is a blood thinner and scheduled to come back in the morning for an ultrasound to rule out a blood clot in your leg   Contusion A contusion is a deep bruise. Contusions happen when an injury causes bleeding under the skin. Symptoms of bruising include pain, swelling, and discolored skin. The skin may turn blue, purple, or yellow. HOME CARE   Rest the injured area.  If told, put ice on the injured area.  Put ice in a plastic bag.  Place a towel between your skin and the bag.  Leave the ice on for 20 minutes, 2-3 times per day.  If told, put light pressure (compression) on the injured area using an elastic bandage. Make sure the bandage is not too tight. Remove it and put it back on as told by your doctor.  If possible, raise (elevate) the injured area above the level of your heart while you are sitting or lying down.  Take over-the-counter and prescription medicines only as told by your doctor. GET HELP IF:  Your symptoms do not get better after several days of treatment.  Your symptoms get worse.  You have trouble moving the injured area. GET HELP RIGHT AWAY IF:   You have very bad pain.  You have a loss of feeling (numbness) in a hand or foot.  Your hand or foot turns pale or cold.   This information is not intended to replace advice given to you by your health care provider. Make sure you discuss any questions you have with your health care provider.   Document Released: 02/29/2008 Document Revised: 06/03/2015 Document Reviewed: 01/28/2015 Elsevier Interactive Patient Education [employer].

## 2016-01-05 NOTE — Progress Notes (Signed)
VASCULAR LAB PRELIMINARY  PRELIMINARY  PRELIMINARY  PRELIMINARY  Right lower extremity venous duplex completed.    Preliminary report:  Right:  No evidence of DVT, superficial thrombosis, or Baker's cyst.  Levada Bowersox, RVT 01/05/2016, 9:02 AM

## 2016-01-19 ENCOUNTER — Ambulatory Visit: Payer: Medicare Other | Admitting: Neurology

## 2016-01-19 ENCOUNTER — Telehealth: Payer: Self-pay | Admitting: *Deleted

## 2016-01-19 NOTE — Telephone Encounter (Signed)
Called office at 10:50am to cancel his 11:30am new patient appointment - stated he did not have his co-pay.

## 2016-01-20 ENCOUNTER — Encounter: Payer: Self-pay | Admitting: Neurology

## 2016-01-28 ENCOUNTER — Ambulatory Visit: Payer: Medicare Other | Admitting: Neurology

## 2016-01-29 DIAGNOSIS — Z0100 Encounter for examination of eyes and vision without abnormal findings: Secondary | ICD-10-CM | POA: Diagnosis not present

## 2016-02-09 ENCOUNTER — Ambulatory Visit: Payer: Medicare Other | Admitting: Family Medicine

## 2016-02-27 ENCOUNTER — Telehealth: Payer: Self-pay

## 2016-02-27 NOTE — Telephone Encounter (Signed)
Patient is on the list for [employer] 2017 and may be a good candidate for an AWV in 2017.  RE: patient was seen in ED and referred to neurology and he has a different PCP listed (Eric L. Marlou Sa is listed).   Please let me know if pt did change PCP when you have a chance.

## 2016-02-29 NOTE — Telephone Encounter (Signed)
Called patient.  He is no longer a patient of Dr. Etter Sjogren.  He is currently a patient of Dr. Marlou Sa.

## 2016-08-06 ENCOUNTER — Emergency Department (HOSPITAL_COMMUNITY)
Admission: EM | Admit: 2016-08-06 | Discharge: 2016-08-06 | Disposition: A | Discharge status: Home / Self Care | Payer: Commercial Managed Care - HMO | Attending: Emergency Medicine | Admitting: Emergency Medicine

## 2016-08-06 ENCOUNTER — Encounter (HOSPITAL_COMMUNITY): Payer: Self-pay

## 2016-08-06 DIAGNOSIS — I10 Essential (primary) hypertension: Secondary | ICD-10-CM | POA: Diagnosis not present

## 2016-08-06 DIAGNOSIS — E119 Type 2 diabetes mellitus without complications: Secondary | ICD-10-CM | POA: Insufficient documentation

## 2016-08-06 DIAGNOSIS — F17210 Nicotine dependence, cigarettes, uncomplicated: Secondary | ICD-10-CM | POA: Insufficient documentation

## 2016-08-06 DIAGNOSIS — L03012 Cellulitis of left finger: Secondary | ICD-10-CM | POA: Diagnosis not present

## 2016-08-06 DIAGNOSIS — Z79899 Other long term (current) drug therapy: Secondary | ICD-10-CM | POA: Insufficient documentation

## 2016-08-06 LAB — CBG MONITORING, ED: Glucose-Capillary: 286 mg/dL — ABNORMAL HIGH (ref 65–99)

## 2016-08-06 MED ORDER — BUPIVACAINE HCL (PF) 0.5 % IJ SOLN
10.0000 mL | Freq: Once | INTRAMUSCULAR | Status: AC
  Administered 2016-08-06: 10 mL
  Filled 2016-08-06: qty 10

## 2016-08-06 MED ORDER — CEPHALEXIN 500 MG PO CAPS
500.0000 mg | ORAL_CAPSULE | Freq: Three times a day (TID) | ORAL | 0 refills | Status: DC
Start: 2016-08-06 — End: 2016-11-26

## 2016-08-06 NOTE — ED Provider Notes (Signed)
[facility] DEPT Provider Note   CSN: [id number] Arrival date & time: 08/06/16  [id number]     History   Chief Complaint No chief complaint on file.   HPI [name] is a 45 y.o. male with a past medical history of diabetes, obesity, hypertension. Patient is under the care of Dr. Kevan Ny. He states that he quit smoking and has been on a new diet. Blood sugars today is 286. He states this is much improved from previous blood sugars running in the 400s on a daily basis. The patient presents with chief complaint of left finger infection. He thought he had a hangnail and hasn't noticed worsening swelling and tenderness around the nail fold of his left index finger. He has not noticed any drainage. He denies fevers or chills.  HPI  Past Medical History:  Diagnosis Date  . Ankle fracture, right   . Bell's palsy   . Bipolar affective disorder ([place])   . Diabetes mellitus   . Gout   . Hyperlipidemia    Per pt - reported on 03/07/12  . Hypertension   . Obesity   . Sleep apnea     Patient Active Problem List   Diagnosis Date Noted  . Preventative health care 08/11/2015  . HTN (hypertension) 12/20/2012  . Recurrent boils 12/20/2012  . OBSTRUCTIVE SLEEP APNEA 08/13/2010  . ANA POSITIVE 04/01/2010  . ERECTILE DYSFUNCTION, ORGANIC 03/30/2010  . GOUT, UNSPECIFIED 06/16/2009  . HYPERLIPIDEMIA 11/14/2008  . MORBID OBESITY 10/17/2008  . Diabetes mellitus type II, uncontrolled ([place]) 03/01/2007  . PROTEINURIA 03/01/2007  . SCHIZOPHRENIA 02/26/2007  . Bipolar disorder, unspecified 02/26/2007    Past Surgical History:  Procedure Laterality Date  . KELOID EXCISION    . VASECTOMY         Home Medications    Prior to Admission medications   Medication Sig Start Date End Date Taking? Authorizing Provider  atorvastatin (LIPITOR) 40 MG tablet Take 1 tablet (40 mg total) by mouth daily. 08/11/15   Rosalita Chessman Chase, DO  doxycycline (VIBRA-TABS) 100 MG tablet Take 1 tablet (100  mg total) by mouth 2 (two) times daily. Patient not taking: Reported on 01/05/2016 08/11/15   Rosalita Chessman Chase, DO  ezetimibe (ZETIA) 10 MG tablet Take 1 tablet (10 mg total) by mouth daily. Patient not taking: Reported on 01/05/2016 08/11/15   Alferd Apa Lowne Chase, DO  fenofibrate 160 MG tablet Take 1 tablet (160 mg total) by mouth daily. 08/18/15   Rosalita Chessman Chase, DO  gabapentin (NEURONTIN) 300 MG capsule Take 300 mg by mouth 3 (three) times daily.    Historical Provider, MD  glipiZIDE-metformin (METAGLIP) 5-500 MG tablet Take 1 tablet by mouth 2 (two) times daily before a meal. 08/18/15   Rosalita Chessman Chase, DO  glucose blood (ONETOUCH VERIO) test strip Check blood sugar once daily. Dx: E11.9 08/18/15   Rosalita Chessman Chase, DO  ibuprofen (ADVIL,MOTRIN) 800 MG tablet Take 1 tablet (800 mg total) by mouth 3 (three) times daily. Patient not taking: Reported on 01/05/2016 07/24/14   Britt Bottom, NP  lisinopril (PRINIVIL,ZESTRIL) 20 MG tablet Take 1 tablet (20 mg total) by mouth daily. 08/11/15   Alferd Apa Lowne Chase, DO  ONETOUCH DELICA LANCETS FINE MISC Check blood sugar once daily. Dx: E11.9 08/18/15   Rosalita Chessman Chase, DO  oxyCODONE-acetaminophen (PERCOCET/ROXICET) 5-325 MG tablet Take 1 tablet by mouth every 6 (six) hours as needed for severe pain. 01/05/16  Junius Creamer, NP  predniSONE (DELTASONE) 10 MG tablet Take 60mg  (6 tablets) by mouth daily for the first 5 days, then 50 mg (5 tablets) for one day, then 40 mg (4 tablets) for one day, then 30mg  (3 tablets) for one day, then 20mg  (2 tablets) for one day, then 10 mg (one tablet) the last day. -- total treatment length of 10 days. Patient not taking: Reported on 01/05/2016 12/20/15   [facility] Ward, PA-C  sildenafil (REVATIO) 20 MG tablet Take 1 tablet (20 mg total) by mouth 3 (three) times daily. Patient not taking: Reported on 01/05/2016 08/11/15   Rosalita Chessman Chase, DO  traMADol (ULTRAM) 50 MG tablet Take 1 tablet (50  mg total) by mouth every 6 (six) hours as needed. 12/20/15   Ozella Almond Ward, PA-C  varenicline (CHANTIX CONTINUING MONTH PAK) 1 MG tablet Take 1 tablet (1 mg total) by mouth 2 (two) times daily. Patient not taking: Reported on 01/05/2016 08/11/15   Ann Held, DO    Family History Family History  Problem Relation Age of Onset  . Hypertension Mother   . Diabetes Mother   . Hyperlipidemia Mother   . Pancreatitis Father   . Arthritis Father   . Heart attack Brother     16  . Heart disease Brother     MI  . Heart attack Maternal Grandmother   . Cancer Maternal Grandmother     breast  . Kidney disease Sister     dialysis  . Lupus Sister   . Stroke Sister   . Diabetes Maternal Aunt   . Heart disease Maternal Aunt   . Diabetes Paternal Aunt   . Heart disease Paternal Aunt     pacemaker  . Heart disease Paternal Uncle     pacemaker  . Diabetes Paternal Grandmother   . Stroke Paternal Aunt   . COPD Maternal Aunt   . Heart disease Maternal Aunt   . Throat cancer      aunt  . Kidney failure      Social History Social History  Substance Use Topics  . Smoking status: Current Every Day Smoker    Packs/day: 0.50    Years: 25.00    Types: Cigarettes  . Smokeless tobacco: Not on file  . Alcohol use No     Allergies   Cucumber extract   Review of Systems Review of Systems Ten systems reviewed and are negative for acute change, except as noted in the HPI.    Physical Exam Updated Vital Signs BP 111/83 (BP Location: Left Arm)   Pulse 85   Temp 98.5 F (36.9 C) (Oral)   Resp 20   SpO2 98%   Physical Exam  Constitutional: He appears well-developed and well-nourished. No distress.  HENT:  Head: Normocephalic and atraumatic.  Eyes: Conjunctivae are normal. No scleral icterus.  Neck: Normal range of motion. Neck supple.  Cardiovascular: Normal rate, regular rhythm and normal heart sounds.   Pulmonary/Chest: Effort normal and breath sounds normal. No  respiratory distress.  Abdominal: Soft. There is no tenderness.  Musculoskeletal: He exhibits no edema.  Left index finger with swelling and tenderness around nailbed. No sign of infection.   Neurological: He is alert.  Skin: Skin is warm and dry. He is not diaphoretic.  Psychiatric: His behavior is normal.  Nursing note and vitals reviewed.    ED Treatments / Results  Labs (all labs ordered are listed, but only abnormal results are displayed) Labs Reviewed  CBG MONITORING, ED - Abnormal; Notable for the following:       Result Value   Glucose-Capillary 286 (*)    All other components within normal limits    EKG  EKG Interpretation None       Radiology No results found.  Procedures Drain paronychia Date/Time: 08/06/2016 9:05 AM Performed by: Margarita Mail Authorized by: Margarita Mail  Consent given by: patient Patient identity confirmed: verbally with patient and provided demographic data Time out: Immediately prior to procedure a "time out" was called to verify the correct patient, procedure, equipment, support staff and site/side marked as required. Local anesthesia used: yes  Anesthesia: Local anesthesia used: yes Local Anesthetic: bupivacaine 0.5% without epinephrine Anesthetic total: 8 mL  Sedation: Patient sedated: no Patient tolerance: Patient tolerated the procedure well with no immediate complications    (including critical care time)  Medications Ordered in ED Medications  bupivacaine (MARCAINE) 0.5 % injection 10 mL (not administered)     Initial Impression / Assessment and Plan / ED Course  I have reviewed the triage vital signs and the nursing notes.  Pertinent labs & imaging results that were available during my care of the patient were reviewed by me and considered in my medical decision making (see chart for details).  Clinical Course     45 year old diabetic male with apparent knee. T of the left index finger. Successful I&D.  Patient given home care instructions. He is follow up in the next 48 hours with his PCP for wound recheck. Will cover with [place] for infection. [place] safe for discharge at this time  Final Clinical Impressions(s) / ED Diagnoses   Final diagnoses:  None    New Prescriptions New Prescriptions   No medications on file     Margarita Mail, PA-C 08/06/16 [place], MD 08/06/16 1537

## 2016-08-06 NOTE — ED Triage Notes (Signed)
Patient complains of left index finger pain and swelling x 1 week. Swelling and discoloration  Noted around tip of finger and nailbed, denies trauma, denies drainage.

## 2016-08-06 NOTE — Discharge Instructions (Signed)
°  Follow up wit your doctor in 2 days for a wound recheck.  SEEK MEDICAL CARE IF:  Your symptoms get worse or do not improve with treatment.  You have a fever or chills.  You have redness spreading from the affected area.  You have continued or increased fluid, blood, or pus coming from the affected area.  Your finger or knuckle becomes swollen or is difficult to move.

## 2016-10-16 ENCOUNTER — Encounter (HOSPITAL_COMMUNITY): Payer: Self-pay

## 2016-10-16 ENCOUNTER — Emergency Department (HOSPITAL_COMMUNITY)
Admission: EM | Admit: 2016-10-16 | Discharge: 2016-10-17 | Disposition: A | Discharge status: Home / Self Care | Payer: Medicare HMO | Attending: Emergency Medicine | Admitting: Emergency Medicine

## 2016-10-16 DIAGNOSIS — M109 Gout, unspecified: Secondary | ICD-10-CM

## 2016-10-16 DIAGNOSIS — Z7984 Long term (current) use of oral hypoglycemic drugs: Secondary | ICD-10-CM | POA: Diagnosis not present

## 2016-10-16 DIAGNOSIS — Z79899 Other long term (current) drug therapy: Secondary | ICD-10-CM | POA: Diagnosis not present

## 2016-10-16 DIAGNOSIS — E119 Type 2 diabetes mellitus without complications: Secondary | ICD-10-CM | POA: Insufficient documentation

## 2016-10-16 DIAGNOSIS — M79641 Pain in right hand: Secondary | ICD-10-CM | POA: Diagnosis present

## 2016-10-16 DIAGNOSIS — M7989 Other specified soft tissue disorders: Secondary | ICD-10-CM | POA: Diagnosis not present

## 2016-10-16 DIAGNOSIS — I10 Essential (primary) hypertension: Secondary | ICD-10-CM | POA: Diagnosis not present

## 2016-10-16 DIAGNOSIS — F17210 Nicotine dependence, cigarettes, uncomplicated: Secondary | ICD-10-CM | POA: Diagnosis not present

## 2016-10-16 MED ORDER — HYDROCODONE-ACETAMINOPHEN 5-325 MG PO TABS
2.0000 | ORAL_TABLET | Freq: Once | ORAL | Status: AC
  Administered 2016-10-17: 2 via ORAL
  Filled 2016-10-16: qty 2

## 2016-10-16 NOTE — ED Provider Notes (Signed)
[facility] DEPT Provider Note   CSN: [id number] Arrival date & time: 10/16/16  2323   By signing my name below, I, Eunice Blase, attest that this documentation has been prepared under the direction and in the presence of Montine Circle, PA-C. Electronically Signed: Eunice Blase, Scribe. 10/16/16. 11:52 PM.   History   Chief Complaint Chief Complaint  Patient presents with  . Hand Pain   The history is provided by the patient and medical records. No language interpreter was used.    HPI Comments: [name] is a 46 y.o. male with Hx of gout who presents to the Emergency Department complaining of sudden onset, rapidly worsening left hand swelling x < 23 hours. He reports pain and swelling to the 2nd and 3rd knuckles. He states gout pain is normally in his toes, and further notes it radiated to his left knee for the first time ~2-3 months ago. He denies injury or animal bites.  Past Medical History:  Diagnosis Date  . Ankle fracture, right   . Bell's palsy   . Bipolar affective disorder ([place])   . Diabetes mellitus   . Gout   . Hyperlipidemia    Per pt - reported on 03/07/12  . Hypertension   . Obesity   . Sleep apnea     Patient Active Problem List   Diagnosis Date Noted  . Preventative health care 08/11/2015  . HTN (hypertension) 12/20/2012  . Recurrent boils 12/20/2012  . OBSTRUCTIVE SLEEP APNEA 08/13/2010  . ANA POSITIVE 04/01/2010  . ERECTILE DYSFUNCTION, ORGANIC 03/30/2010  . GOUT, UNSPECIFIED 06/16/2009  . HYPERLIPIDEMIA 11/14/2008  . MORBID OBESITY 10/17/2008  . Diabetes mellitus type II, uncontrolled ([place]) 03/01/2007  . PROTEINURIA 03/01/2007  . SCHIZOPHRENIA 02/26/2007  . Bipolar disorder, unspecified 02/26/2007    Past Surgical History:  Procedure Laterality Date  . KELOID EXCISION    . VASECTOMY         Home Medications    Prior to Admission medications   Medication Sig Start Date End Date Taking? Authorizing Provider  atorvastatin  (LIPITOR) 40 MG tablet Take 1 tablet (40 mg total) by mouth daily. 08/11/15   Rosalita Chessman Chase, DO  cephALEXin (KEFLEX) 500 MG capsule Take 1 capsule (500 mg total) by mouth 3 (three) times daily. 08/06/16   Margarita Mail, PA-C  doxycycline (VIBRA-TABS) 100 MG tablet Take 1 tablet (100 mg total) by mouth 2 (two) times daily. Patient not taking: Reported on 01/05/2016 08/11/15   Rosalita Chessman Chase, DO  ezetimibe (ZETIA) 10 MG tablet Take 1 tablet (10 mg total) by mouth daily. Patient not taking: Reported on 01/05/2016 08/11/15   Alferd Apa Lowne Chase, DO  fenofibrate 160 MG tablet Take 1 tablet (160 mg total) by mouth daily. 08/18/15   Rosalita Chessman Chase, DO  gabapentin (NEURONTIN) 300 MG capsule Take 300 mg by mouth 3 (three) times daily.    Historical Provider, MD  glipiZIDE-metformin (METAGLIP) 5-500 MG tablet Take 1 tablet by mouth 2 (two) times daily before a meal. 08/18/15   Rosalita Chessman Chase, DO  glucose blood (ONETOUCH VERIO) test strip Check blood sugar once daily. Dx: E11.9 08/18/15   Rosalita Chessman Chase, DO  ibuprofen (ADVIL,MOTRIN) 800 MG tablet Take 1 tablet (800 mg total) by mouth 3 (three) times daily. Patient not taking: Reported on 01/05/2016 07/24/14   Britt Bottom, NP  lisinopril (PRINIVIL,ZESTRIL) 20 MG tablet Take 1 tablet (20 mg total) by mouth daily. 08/11/15   Kendrick Fries  R Lowne Chase, DO  ONETOUCH DELICA LANCETS FINE MISC Check blood sugar once daily. Dx: E11.9 08/18/15   Rosalita Chessman Chase, DO  oxyCODONE-acetaminophen (PERCOCET/ROXICET) 5-325 MG tablet Take 1 tablet by mouth every 6 (six) hours as needed for severe pain. 01/05/16   Junius Creamer, NP  predniSONE (DELTASONE) 10 MG tablet Take 60mg  (6 tablets) by mouth daily for the first 5 days, then 50 mg (5 tablets) for one day, then 40 mg (4 tablets) for one day, then 30mg  (3 tablets) for one day, then 20mg  (2 tablets) for one day, then 10 mg (one tablet) the last day. -- total treatment length of 10 days. Patient  not taking: Reported on 01/05/2016 12/20/15   [facility] Ward, PA-C  sildenafil (REVATIO) 20 MG tablet Take 1 tablet (20 mg total) by mouth 3 (three) times daily. Patient not taking: Reported on 01/05/2016 08/11/15   Rosalita Chessman Chase, DO  traMADol (ULTRAM) 50 MG tablet Take 1 tablet (50 mg total) by mouth every 6 (six) hours as needed. 12/20/15   Ozella Almond Ward, PA-C  varenicline (CHANTIX CONTINUING MONTH PAK) 1 MG tablet Take 1 tablet (1 mg total) by mouth 2 (two) times daily. Patient not taking: Reported on 01/05/2016 08/11/15   Ann Held, DO    Family History Family History  Problem Relation Age of Onset  . Hypertension Mother   . Diabetes Mother   . Hyperlipidemia Mother   . Pancreatitis Father   . Arthritis Father   . Heart attack Brother     53  . Heart disease Brother     MI  . Heart attack Maternal Grandmother   . Cancer Maternal Grandmother     breast  . Kidney disease Sister     dialysis  . Lupus Sister   . Stroke Sister   . Diabetes Maternal Aunt   . Heart disease Maternal Aunt   . Diabetes Paternal Aunt   . Heart disease Paternal Aunt     pacemaker  . Heart disease Paternal Uncle     pacemaker  . Diabetes Paternal Grandmother   . Stroke Paternal Aunt   . COPD Maternal Aunt   . Heart disease Maternal Aunt   . Throat cancer      aunt  . Kidney failure      Social History Social History  Substance Use Topics  . Smoking status: Current Every Day Smoker    Packs/day: 0.50    Years: 25.00    Types: Cigarettes  . Smokeless tobacco: Never Used  . Alcohol use No     Allergies   Cucumber extract   Review of Systems Review of Systems  Constitutional: Negative for activity change, chills and fever.  Musculoskeletal: Positive for arthralgias and joint swelling.  Psychiatric/Behavioral: Negative for confusion.     Physical Exam Updated Vital Signs BP (!) 152/105   Pulse 61   Temp 98.1 F (36.7 C) (Oral)   Resp 20   SpO2 100%    Physical Exam Physical Exam  Constitutional: Pt appears well-developed and well-nourished. No distress.  HENT:  Head: Normocephalic and atraumatic.  Eyes: Conjunctivae are normal.  Neck: Normal range of motion.  Cardiovascular: Normal rate, regular rhythm and intact distal pulses.   Capillary refill < 3 sec  Pulmonary/Chest: Effort normal and breath sounds normal.  Musculoskeletal: Right Hand:  Pt exhibits tenderness to palpation over right 3rd MCP. Pt exhibits no edema.  ROM: 4/5 limited by pain  Neurological:  Pt  is alert. Coordination normal.  Sensation 5/5 Strength 4/5 limited by pain  Skin: Skin is warm and dry. Pt is not diaphoretic.  No tenting of the skin, no erythema, cellulitis, or abscess  Psychiatric: Pt has a normal mood and affect.  Nursing note and vitals reviewed.   ED Treatments / Results  DIAGNOSTIC STUDIES: Oxygen Saturation is 100% on RA, normal by my interpretation.    COORDINATION OF CARE: 11:52 PM Discussed treatment plan with pt at bedside and pt agreed to plan.  Labs (all labs ordered are listed, but only abnormal results are displayed) Labs Reviewed - No data to display  EKG  EKG Interpretation None       Radiology No results found.  Procedures Procedures (including critical care time)  Medications Ordered in ED Medications  HYDROcodone-acetaminophen (NORCO/VICODIN) 5-325 MG per tablet 2 tablet (not administered)     Initial Impression / Assessment and Plan / ED Course  I have reviewed the triage vital signs and the nursing notes.  Pertinent labs & imaging results that were available during my care of the patient were reviewed by me and considered in my medical decision making (see chart for details).      Patient with right hand pain. Sudden onset. No trauma. History of gout. Plain film shows MCP joint calcifications and ligamentous calcifications, this is consistent with physical exam. I am however suspicious of gout in this  patient. He has no evidence of septic joint or traumatic injury. He is afebrile. There is no evidence of cellulitis or abscess. He has a history of gout. I'll give him a hand splint, some prednisone, and pain medicine. I have advised patient follow-up closely with his primary care doctor.  I personally performed the services described in this documentation, which was scribed in my presence. The recorded information has been reviewed and is accurate.     Final Clinical Impressions(s) / ED Diagnoses   Final diagnoses:  Hand pain, right  Acute gout of right hand, unspecified cause    New Prescriptions New Prescriptions   HYDROCODONE-ACETAMINOPHEN (NORCO/VICODIN) 5-325 MG TABLET    Take 1-2 tablets by mouth every 6 (six) hours as needed.   PREDNISONE (DELTASONE) 20 MG TABLET    Take 2 tablets (40 mg total) by mouth daily.     Montine Circle, PA-C 10/17/16 [id number]    Merryl Hacker, MD 10/17/16 [phone]

## 2016-10-16 NOTE — ED Triage Notes (Signed)
Pt complaining of R hand swelling. Pt with redness and swelling to knuckles of 2nd and 3rd fingers. Pt states hx of gout, not sure if same or different. Pt denies any injury/trauma to hand.

## 2016-10-17 ENCOUNTER — Emergency Department (HOSPITAL_COMMUNITY): Payer: Medicare HMO

## 2016-10-17 DIAGNOSIS — M79641 Pain in right hand: Secondary | ICD-10-CM | POA: Diagnosis not present

## 2016-10-17 DIAGNOSIS — M7989 Other specified soft tissue disorders: Secondary | ICD-10-CM | POA: Diagnosis not present

## 2016-10-17 MED ORDER — PREDNISONE 20 MG PO TABS: 40.0000 mg | ORAL_TABLET | Freq: Every day | ORAL | 0 refills | Status: DC

## 2016-10-17 MED ORDER — HYDROCODONE-ACETAMINOPHEN 5-325 MG PO TABS: 1.0000 | ORAL_TABLET | Freq: Four times a day (QID) | ORAL | 0 refills | Status: DC | PRN

## 2016-10-17 NOTE — ED Notes (Signed)
Pt verbalized understanding of d/c instruction including gout diet.

## 2016-10-17 NOTE — ED Notes (Signed)
Pt back from x-ray.

## 2016-10-17 NOTE — ED Notes (Signed)
Pt transported to xray via stretcher

## 2016-10-17 NOTE — ED Notes (Signed)
Ortho tech called for advice on splint d/t size of pts hand and wrist

## 2016-11-26 ENCOUNTER — Emergency Department (HOSPITAL_COMMUNITY)
Admission: EM | Admit: 2016-11-26 | Discharge: 2016-11-26 | Disposition: A | Discharge status: Home / Self Care | Payer: Medicare HMO | Attending: Emergency Medicine | Admitting: Emergency Medicine

## 2016-11-26 ENCOUNTER — Emergency Department (HOSPITAL_COMMUNITY): Payer: Medicare HMO

## 2016-11-26 ENCOUNTER — Encounter (HOSPITAL_COMMUNITY): Payer: Self-pay | Admitting: Emergency Medicine

## 2016-11-26 DIAGNOSIS — Z7984 Long term (current) use of oral hypoglycemic drugs: Secondary | ICD-10-CM | POA: Insufficient documentation

## 2016-11-26 DIAGNOSIS — E119 Type 2 diabetes mellitus without complications: Secondary | ICD-10-CM | POA: Diagnosis not present

## 2016-11-26 DIAGNOSIS — F17210 Nicotine dependence, cigarettes, uncomplicated: Secondary | ICD-10-CM | POA: Diagnosis not present

## 2016-11-26 DIAGNOSIS — M25562 Pain in left knee: Secondary | ICD-10-CM

## 2016-11-26 DIAGNOSIS — I10 Essential (primary) hypertension: Secondary | ICD-10-CM | POA: Diagnosis not present

## 2016-11-26 DIAGNOSIS — M25462 Effusion, left knee: Secondary | ICD-10-CM | POA: Insufficient documentation

## 2016-11-26 DIAGNOSIS — Z79899 Other long term (current) drug therapy: Secondary | ICD-10-CM | POA: Insufficient documentation

## 2016-11-26 NOTE — ED Triage Notes (Signed)
Pt c/o left knee pain x 2 days. Pt denies injury, reports pain increases with weight bearing.

## 2016-11-26 NOTE — ED Provider Notes (Signed)
[facility] DEPT Provider Note   CSN: [id number] Arrival date & time: 11/26/16  1032  By signing my name below, I, Jeanell Sparrow, attest that this documentation has been prepared under the direction and in the presence of non-physician practitioner, Melina Schools, PA-C. Electronically Signed: Jeanell Sparrow, Scribe. 11/26/2016. 12:02 PM.  History   Chief Complaint Chief Complaint  Patient presents with  . Knee Pain   The history is provided by the patient. No language interpreter was used.   HPI Comments: [name] is a 46 y.o. male who presents to the Emergency Department complaining of constant moderate left knee pain that started about 2 days ago. No known injury. He had gout in his left great toe that was resolved with Colchicine 4 days ago. No other medication PTA. He describes the pain as exacerbated by weight-bearing and bending. He reports associated knee swelling. He denies any fever, redness, trauma, nausea, emesis or other complaints. Minimal weightbearing due to pain.    PCP: Rogers Blocker, MD  Past Medical History:  Diagnosis Date  . Ankle fracture, right   . Bell's palsy   . Bipolar affective disorder ([place])   . Diabetes mellitus   . Gout   . Hyperlipidemia    Per pt - reported on 03/07/12  . Hypertension   . Obesity   . Sleep apnea     Patient Active Problem List   Diagnosis Date Noted  . Preventative health care 08/11/2015  . HTN (hypertension) 12/20/2012  . Recurrent boils 12/20/2012  . OBSTRUCTIVE SLEEP APNEA 08/13/2010  . ANA POSITIVE 04/01/2010  . ERECTILE DYSFUNCTION, ORGANIC 03/30/2010  . GOUT, UNSPECIFIED 06/16/2009  . HYPERLIPIDEMIA 11/14/2008  . MORBID OBESITY 10/17/2008  . Diabetes mellitus type II, uncontrolled ([place]) 03/01/2007  . PROTEINURIA 03/01/2007  . SCHIZOPHRENIA 02/26/2007  . Bipolar disorder, unspecified 02/26/2007    Past Surgical History:  Procedure Laterality Date  . KELOID EXCISION    . VASECTOMY         Home  Medications    Prior to Admission medications   Medication Sig Start Date End Date Taking? Authorizing Provider  atorvastatin (LIPITOR) 40 MG tablet Take 1 tablet (40 mg total) by mouth daily. 08/11/15  Yes Yvonne R Lowne Chase, DO  ibuprofen (ADVIL,MOTRIN) 200 MG tablet Take 600 mg by mouth every 6 (six) hours as needed for moderate pain.   Yes Historical Provider, MD  metFORMIN (GLUCOPHAGE) 500 MG tablet Take 500 mg by mouth 2 (two) times daily with a meal.   Yes Historical Provider, MD    Family History Family History  Problem Relation Age of Onset  . Hypertension Mother   . Diabetes Mother   . Hyperlipidemia Mother   . Pancreatitis Father   . Arthritis Father   . Heart attack Brother     67  . Heart disease Brother     MI  . Heart attack Maternal Grandmother   . Cancer Maternal Grandmother     breast  . Kidney disease Sister     dialysis  . Lupus Sister   . Stroke Sister   . Diabetes Maternal Aunt   . Heart disease Maternal Aunt   . Diabetes Paternal Aunt   . Heart disease Paternal Aunt     pacemaker  . Heart disease Paternal Uncle     pacemaker  . Diabetes Paternal Grandmother   . Stroke Paternal Aunt   . COPD Maternal Aunt   . Heart disease Maternal Aunt   .  Throat cancer      aunt  . Kidney failure      Social History Social History  Substance Use Topics  . Smoking status: Current Every Day Smoker    Packs/day: 0.50    Years: 25.00    Types: Cigarettes  . Smokeless tobacco: Never Used  . Alcohol use No     Allergies   Cucumber extract   Review of Systems Review of Systems  Constitutional: Negative for fever.  Musculoskeletal: Positive for arthralgias, joint swelling and myalgias.  Skin: Negative for color change.  All other systems reviewed and are negative.    Physical Exam Updated Vital Signs BP 144/87 (BP Location: Left Arm)   Pulse 70   Temp 97.9 F (36.6 C) (Oral)   Resp 18   Ht 6\' 3"  (1.905 m)   Wt (!) 380 lb (172.4 kg)   SpO2  95%   BMI 47.50 kg/m   Physical Exam  Constitutional: He is oriented to person, place, and time. He appears well-developed and well-nourished. No distress.  Nontoxic appearing.  HENT:  Head: Normocephalic and atraumatic.  Eyes: Conjunctivae are normal.  Neck: Neck supple.  Cardiovascular: Normal rate.   Pulmonary/Chest: Effort normal.  Abdominal: Soft.  Musculoskeletal: Normal range of motion.  Edema to the lateral aspect of left knee. No ecchymosis or erythema noted. No warmth. Limited ROM due to pain but able to fully extend and slightly flex the leg.. Strength is 5/5. DP pulses +2. No calf tenderness. No lower extremity edema.   Neurological: He is alert and oriented to person, place, and time.  Skin: Skin is warm and dry. Capillary refill takes less than 2 seconds.  Psychiatric: He has a normal mood and affect.  Nursing note and vitals reviewed.    ED Treatments / Results  DIAGNOSTIC STUDIES: Oxygen Saturation is 95% on RA, normal by my interpretation.    COORDINATION OF CARE: 12:06 PM- Pt advised of plan for treatment and pt agrees.  Labs (all labs ordered are listed, but only abnormal results are displayed) Labs Reviewed - No data to display  EKG  EKG Interpretation None       Radiology Dg Knee Complete 4 Views Left  Result Date: 11/26/2016 CLINICAL DATA:  Knee pain.  Denies injury.  Unable to bear weight. EXAM: LEFT KNEE - COMPLETE 4+ VIEW COMPARISON:  07/24/2014. FINDINGS: Spurring in the knee compartments, most prominent at the patellofemoral compartment. Again noted is a large suprapatellar joint effusion. Negative for fracture or dislocation. Alignment of left knee is normal. IMPRESSION: Large joint effusion. No acute bone abnormality. Osteoarthritic changes. Electronically Signed   By: Markus Daft M.D.   On: 11/26/2016 11:58    Procedures Procedures (including critical care time)  Medications Ordered in ED Medications - No data to display   Initial  Impression / Assessment and Plan / ED Course  I have reviewed the triage vital signs and the nursing notes.  Pertinent labs & imaging results that were available during my care of the patient were reviewed by me and considered in my medical decision making (see chart for details).     Patient presents to the ED with gradually worsening acute onset left knee pain with left lateral swelling. No erythema, warmth, ecchymosis noted. No known trauma. Patient with history of gout however the exam and history does not seem consistent with acute gout flare. Patient does have range of motion of the knee. Denies fever, warmth, erythema. Low suspicion for septic joint.  Patient able to minimally weight-bear. X-ray negative for obvious fracture or dislocation. There is a large joint effusion again noted on the x-ray as from prior x-ray in 12/2015. Pt advised to follow up with orthopedics if symptoms persist for possibility of missed fracture diagnosis. States that he sees [facility]. Had to have his right knee drained a few months ago. Do not feel that draining the effusion will benefit patient as it'll introduce bacteria. Given Ace wrap and knee immobilizer with crutches. Dr. Laneta Simmers saw and evaluated patient. Performed a bedside ultrasound to assess for any tendon rupture. Recommends splinting and follow-up with orthopedics. Given strict return precautions the patient including worsening pain, worsening redness, fever. Symptomatically treatment at home including rice therapy. Patient is agreeable to above plan and feels safe for discharge. All questions answered prior to discharge. Final Clinical Impressions(s) / ED Diagnoses   Final diagnoses:  Acute pain of left knee  Effusion of left knee    New Prescriptions New Prescriptions   No medications on file   I personally performed the services described in this documentation, which was scribed in my presence. The recorded information has been reviewed  and is accurate.     Doristine Devoid, PA-C 11/26/16 1255    Leo Grosser, MD 11/26/16 [phone]

## 2016-11-26 NOTE — ED Provider Notes (Signed)
Medical screening examination/treatment/procedure(s) were conducted as a shared visit with non-physician practitioner(s) and myself.  I personally evaluated the patient during the encounter.   EKG Interpretation None      46 y.o. male presents with knee swelling. Large effusion present. Likely 2/2 flare of underlying OA. No signs of septic joint. No evidence of patellar tendon injury on bedside study and maintains extensor mechanism. F/u orthopedics to evaluate for PT or other intervention.   See related encounter note  Emergency Focused Ultrasound Exam Limited Ultrasound of Soft Tissue   Performed and interpreted by Dr. Laneta Simmers Indication: evaluation for infection or foreign body Transverse and Sagittal views of left knee are obtained in real time for the purposes of evaluation of skin and underlying soft tissues.  Findings: large homogeneous fluid collection, no hyperemia/edema of surrounding tissue Interpretation: no abscess, no cellulitis, large knee joint effusion Images archived electronically.  CPT Codes:   Lower extremity [id number]  Other soft tissue [id number]     Leo Grosser, MD 11/26/16 [phone]

## 2016-11-26 NOTE — Discharge Instructions (Signed)
X-ray shows fluid around the kneecap. Please keep the Ace wrap on. Please ice, rest, elevate the left knee. Wear the knee immobilizer. Use crutches for weightbearing as tolerated. Please call your orthopedist on Monday for follow-up. Return to the ED if he develops any redness, fevers, worsening pain, inability to bend the knee or for any reason.

## 2016-11-26 NOTE — ED Notes (Signed)
Declined W/C at D/C and was escorted to lobby by RN. 

## 2016-11-29 ENCOUNTER — Encounter (INDEPENDENT_AMBULATORY_CARE_PROVIDER_SITE_OTHER): Payer: Self-pay | Admitting: Orthopaedic Surgery

## 2016-11-29 ENCOUNTER — Ambulatory Visit (INDEPENDENT_AMBULATORY_CARE_PROVIDER_SITE_OTHER): Payer: Medicare HMO | Admitting: Orthopaedic Surgery

## 2016-11-29 VITALS — Ht 75.0 in | Wt 380.0 lb

## 2016-11-29 DIAGNOSIS — M25562 Pain in left knee: Secondary | ICD-10-CM | POA: Diagnosis not present

## 2016-11-29 MED ORDER — METHYLPREDNISOLONE ACETATE 40 MG/ML IJ SUSP
40.0000 mg | INTRAMUSCULAR | Status: AC | PRN
  Administered 2016-11-29: 40 mg via INTRA_ARTICULAR

## 2016-11-29 MED ORDER — LIDOCAINE HCL 1 % IJ SOLN
2.0000 mL | INTRAMUSCULAR | Status: AC | PRN
  Administered 2016-11-29: 2 mL

## 2016-11-29 MED ORDER — BUPIVACAINE HCL 0.5 % IJ SOLN
2.0000 mL | INTRAMUSCULAR | Status: AC | PRN
  Administered 2016-11-29: 2 mL via INTRA_ARTICULAR

## 2016-11-29 NOTE — Progress Notes (Addendum)
Office Visit Note   Patient: [name]           Date of Birth: 02-May-1971           MRN: [id number] Visit Date: 11/29/2016              Requested by: Rogers Blocker, MD [address] [place], [place] [address] PCP: Rogers Blocker, MD   Assessment & Plan: Visit Diagnoses:  1. Acute pain of left knee     Plan: I aspirated the knee under sterile conditions injected cortisone. I recommend he see his primary care doctor for management of his gout. The fluid has been sent for analysis. I'll see him back as needed.  Follow-Up Instructions: No Follow-up on file.   Orders:  Orders Placed This Encounter  Procedures  . Large Joint Injection/Arthrocentesis   No orders of the defined types were placed in this encounter.     Procedures: Large Joint Inj Date/Time: 11/29/2016 4:32 PM Performed by: Leandrew Koyanagi Authorized by: Leandrew Koyanagi   Consent Given by:  Patient Timeout: prior to procedure the correct patient, procedure, and site was verified   Indications:  Pain Location:  Knee Site:  L knee Prep: patient was prepped and draped in usual sterile fashion   Needle Size:  22 G Ultrasound Guidance: No   Fluoroscopic Guidance: No   Arthrogram: No   Medications:  2 mL lidocaine 1 %; 2 mL bupivacaine 0.5 %; 40 mg methylPREDNISolone acetate 40 MG/ML Aspiration Attempted: Yes   Aspirate amount (mL):  190 Aspirate:  Clear Patient tolerance:  Patient tolerated the procedure well with no immediate complications     Clinical Data: No additional findings.   Subjective: Chief Complaint  Patient presents with  . Left Knee - Pain    Patient is a 46 year old gentleman with left knee pain and swelling since 11/26/2016. Seen in the ER for the swelling and placed in knee immobilizer. He's been taking Aleve. He's had a gout flareup in his big toe before and his right knee but never in the left knee. Denies any injuries. Denies any constitutional symptoms.    Review of  Systems  Constitutional: Negative.   All other systems reviewed and are negative.    Objective: Vital Signs: Ht 6\' 3"  (1.905 m)   Wt (!) 380 lb (172.4 kg)   BMI 47.50 kg/m   Physical Exam  Constitutional: He is oriented to person, place, and time. He appears well-developed and well-nourished.  HENT:  Head: Normocephalic and atraumatic.  Eyes: Pupils are equal, round, and reactive to light.  Neck: Neck supple.  Pulmonary/Chest: Effort normal.  Abdominal: Soft.  Musculoskeletal: Normal range of motion.  Neurological: He is alert and oriented to person, place, and time.  Skin: Skin is warm.  Psychiatric: He has a normal mood and affect. His behavior is normal. Judgment and thought content normal.  Nursing note and vitals reviewed.   Ortho Exam Exam of the left knee shows a large knee effusion. This is not warm. He has limited range of motion. Collaterals and cruciates are stable. No signs of infection. No cellulitis. Specialty Comments:  No specialty comments available.  Imaging: No results found.   PMFS History: Patient Active Problem List   Diagnosis Date Noted  . Preventative health care 08/11/2015  . HTN (hypertension) 12/20/2012  . Recurrent boils 12/20/2012  . OBSTRUCTIVE SLEEP APNEA 08/13/2010  . ANA POSITIVE 04/01/2010  . ERECTILE DYSFUNCTION, ORGANIC 03/30/2010  .  GOUT, UNSPECIFIED 06/16/2009  . HYPERLIPIDEMIA 11/14/2008  . MORBID OBESITY 10/17/2008  . Diabetes mellitus type II, uncontrolled ([place]) 03/01/2007  . PROTEINURIA 03/01/2007  . SCHIZOPHRENIA 02/26/2007  . Bipolar disorder, unspecified 02/26/2007   Past Medical History:  Diagnosis Date  . Ankle fracture, right   . Bell's palsy   . Bipolar affective disorder ([place])   . Diabetes mellitus   . Gout   . Hyperlipidemia    Per pt - reported on 03/07/12  . Hypertension   . Obesity   . Sleep apnea     Family History  Problem Relation Age of Onset  . Hypertension Mother   . Diabetes Mother   .  Hyperlipidemia Mother   . Pancreatitis Father   . Arthritis Father   . Heart attack Brother     47  . Heart disease Brother     MI  . Heart attack Maternal Grandmother   . Cancer Maternal Grandmother     breast  . Kidney disease Sister     dialysis  . Lupus Sister   . Stroke Sister   . Diabetes Maternal Aunt   . Heart disease Maternal Aunt   . Diabetes Paternal Aunt   . Heart disease Paternal Aunt     pacemaker  . Heart disease Paternal Uncle     pacemaker  . Diabetes Paternal Grandmother   . Stroke Paternal Aunt   . COPD Maternal Aunt   . Heart disease Maternal Aunt   . Throat cancer      aunt  . Kidney failure      Past Surgical History:  Procedure Laterality Date  . KELOID EXCISION    . VASECTOMY     Social History   Occupational History  . disability    Social History Main Topics  . Smoking status: Current Every Day Smoker    Packs/day: 0.50    Years: 25.00    Types: Cigarettes  . Smokeless tobacco: Never Used  . Alcohol use No  . Drug use: No  . Sexual activity: Not Currently    Partners: Female

## 2016-11-29 NOTE — Addendum Note (Signed)
Addended by: Meyer Cory on: 11/29/2016 04:54 PM   Modules accepted: Orders

## 2016-11-30 LAB — SYNOVIAL CELL COUNT + DIFF, W/ CRYSTALS
BASOPHILS, %: 0 %
Eosinophils-Synovial: 0 % (ref 0–2)
LYMPHOCYTES-SYNOVIAL FLD: 5 % (ref 0–74)
MONOCYTE/MACROPHAGE: 20 % (ref 0–69)
NEUTROPHIL, SYNOVIAL: 75 % — AB (ref 0–24)
Synoviocytes, %: 0 % (ref 0–15)
WBC, Synovial: 5350 cells/uL — ABNORMAL HIGH (ref <150)

## 2017-01-08 ENCOUNTER — Emergency Department (HOSPITAL_COMMUNITY): Payer: Medicare HMO

## 2017-01-08 ENCOUNTER — Encounter (HOSPITAL_COMMUNITY): Payer: Self-pay

## 2017-01-08 ENCOUNTER — Emergency Department (HOSPITAL_COMMUNITY)
Admission: EM | Admit: 2017-01-08 | Discharge: 2017-01-09 | Disposition: A | Discharge status: Home / Self Care | Payer: Medicare HMO | Attending: Emergency Medicine | Admitting: Emergency Medicine

## 2017-01-08 DIAGNOSIS — S82141A Displaced bicondylar fracture of right tibia, initial encounter for closed fracture: Secondary | ICD-10-CM | POA: Insufficient documentation

## 2017-01-08 DIAGNOSIS — W19XXXA Unspecified fall, initial encounter: Secondary | ICD-10-CM

## 2017-01-08 DIAGNOSIS — F17210 Nicotine dependence, cigarettes, uncomplicated: Secondary | ICD-10-CM | POA: Diagnosis not present

## 2017-01-08 DIAGNOSIS — S8991XA Unspecified injury of right lower leg, initial encounter: Secondary | ICD-10-CM | POA: Diagnosis present

## 2017-01-08 DIAGNOSIS — Y999 Unspecified external cause status: Secondary | ICD-10-CM | POA: Insufficient documentation

## 2017-01-08 DIAGNOSIS — Y929 Unspecified place or not applicable: Secondary | ICD-10-CM | POA: Insufficient documentation

## 2017-01-08 DIAGNOSIS — S0990XA Unspecified injury of head, initial encounter: Secondary | ICD-10-CM | POA: Diagnosis not present

## 2017-01-08 DIAGNOSIS — R55 Syncope and collapse: Secondary | ICD-10-CM

## 2017-01-08 DIAGNOSIS — E119 Type 2 diabetes mellitus without complications: Secondary | ICD-10-CM | POA: Insufficient documentation

## 2017-01-08 DIAGNOSIS — M25461 Effusion, right knee: Secondary | ICD-10-CM | POA: Diagnosis not present

## 2017-01-08 DIAGNOSIS — X371XXA Tornado, initial encounter: Secondary | ICD-10-CM | POA: Insufficient documentation

## 2017-01-08 DIAGNOSIS — Z7984 Long term (current) use of oral hypoglycemic drugs: Secondary | ICD-10-CM | POA: Diagnosis not present

## 2017-01-08 DIAGNOSIS — W1789XA Other fall from one level to another, initial encounter: Secondary | ICD-10-CM | POA: Insufficient documentation

## 2017-01-08 DIAGNOSIS — S82142A Displaced bicondylar fracture of left tibia, initial encounter for closed fracture: Secondary | ICD-10-CM | POA: Diagnosis not present

## 2017-01-08 DIAGNOSIS — Y939 Activity, unspecified: Secondary | ICD-10-CM | POA: Diagnosis not present

## 2017-01-08 DIAGNOSIS — I10 Essential (primary) hypertension: Secondary | ICD-10-CM | POA: Diagnosis not present

## 2017-01-08 DIAGNOSIS — Z79899 Other long term (current) drug therapy: Secondary | ICD-10-CM | POA: Insufficient documentation

## 2017-01-08 LAB — CBG MONITORING, ED: Glucose-Capillary: 117 mg/dL — ABNORMAL HIGH (ref 65–99)

## 2017-01-08 MED ORDER — OXYCODONE-ACETAMINOPHEN 5-325 MG PO TABS
1.0000 | ORAL_TABLET | Freq: Once | ORAL | Status: AC
  Administered 2017-01-08: 1 via ORAL
  Filled 2017-01-08: qty 1

## 2017-01-08 NOTE — ED Notes (Signed)
Pt now stating he fell, hit head, +LOC, now experiencing blurred vision

## 2017-01-08 NOTE — Progress Notes (Signed)
Orthopedic Tech Progress Note Patient Details:  [name] Jan 08, 1971 [id number]  Ortho Devices Type of Ortho Device: Knee Immobilizer Ortho Device/Splint Location: RLE Ortho Device/Splint Interventions: Ordered, Application   [name] 01/08/2017, 10:44 PM

## 2017-01-08 NOTE — Progress Notes (Signed)
Orthopedic Tech Progress Note Patient Details:  [name] 1970-10-17 [id number]  Ortho Devices Type of Ortho Device: Crutches Ortho Device/Splint Location: patient stated he has his own crutches Ortho Device/Splint Interventions: Ordered, Application   [name] 01/08/2017, 10:47 PM
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
## 2017-01-09 MED ORDER — OXYCODONE-ACETAMINOPHEN 5-325 MG PO TABS: 1.0000 | ORAL_TABLET | Freq: Four times a day (QID) | ORAL | 0 refills | Status: DC | PRN

## 2017-01-11 ENCOUNTER — Ambulatory Visit (INDEPENDENT_AMBULATORY_CARE_PROVIDER_SITE_OTHER): Payer: Medicare HMO | Admitting: Family

## 2017-01-11 ENCOUNTER — Encounter (INDEPENDENT_AMBULATORY_CARE_PROVIDER_SITE_OTHER): Payer: Self-pay | Admitting: Family

## 2017-01-11 DIAGNOSIS — S82141A Displaced bicondylar fracture of right tibia, initial encounter for closed fracture: Secondary | ICD-10-CM | POA: Diagnosis not present

## 2017-01-11 DIAGNOSIS — S82831A Other fracture of upper and lower end of right fibula, initial encounter for closed fracture: Secondary | ICD-10-CM | POA: Diagnosis not present
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
## 2017-01-16 DIAGNOSIS — M25361 Other instability, right knee: Secondary | ICD-10-CM | POA: Diagnosis not present

## 2017-01-26 ENCOUNTER — Ambulatory Visit (INDEPENDENT_AMBULATORY_CARE_PROVIDER_SITE_OTHER): Payer: Medicare HMO | Admitting: Orthopedic Surgery

## 2017-01-26 ENCOUNTER — Ambulatory Visit (INDEPENDENT_AMBULATORY_CARE_PROVIDER_SITE_OTHER): Payer: Medicare HMO

## 2017-01-26 DIAGNOSIS — M25561 Pain in right knee: Secondary | ICD-10-CM | POA: Diagnosis not present
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
## 2017-02-01 ENCOUNTER — Telehealth (INDEPENDENT_AMBULATORY_CARE_PROVIDER_SITE_OTHER): Payer: Self-pay | Admitting: Orthopedic Surgery

## 2017-02-01 ENCOUNTER — Other Ambulatory Visit (INDEPENDENT_AMBULATORY_CARE_PROVIDER_SITE_OTHER): Payer: Self-pay | Admitting: Family

## 2017-02-01 MED ORDER — OXYCODONE-ACETAMINOPHEN 5-325 MG PO TABS: 1.0000 | ORAL_TABLET | Freq: Three times a day (TID) | ORAL | 0 refills | Status: DC | PRN

## 2017-02-01 NOTE — Telephone Encounter (Signed)
PT CALLED AND WANTS TO KNOW IF WE CAN GIVE HIM AN RX FOR PAIN THAT HE IS STILL HAVING WITH HIS KNEE.   PLEASE ADVISE [phone]

## 2017-02-01 NOTE — Telephone Encounter (Signed)
Pt was seen in the office for hyperflex injury to knee and is pending MRI being sch. He is asking for something for pain.

## 2017-02-02 ENCOUNTER — Telehealth (INDEPENDENT_AMBULATORY_CARE_PROVIDER_SITE_OTHER): Payer: Self-pay | Admitting: Orthopedic Surgery

## 2017-02-02 NOTE — Telephone Encounter (Signed)
Last 3 ov notes faxed to [facility] [phone]

## 2017-02-07 ENCOUNTER — Other Ambulatory Visit: Payer: Medicare HMO

## 2017-02-14 IMAGING — CT CT MAXILLOFACIAL W/ CM
3 series · 14 of 47 positions shown, 16 images · IV contrast (Omni 300)
Comparison: None.

CLINICAL DATA: 44-year-old male with left facial swelling since
last night. Initial encounter.

EXAM:
CT MAXILLOFACIAL WITH CONTRAST
TECHNIQUE: Multidetector CT imaging of the maxillofacial structures was
performed with intravenous contrast. Multiplanar CT image
reconstructions were also generated. A small metallic BB was placed
on the right temple in order to reliably differentiate right from
left.
CONTRAST:  1 Y8OH4Y-FNN IOPAMIDOL (Y8OH4Y-FNN) INJECTION 61%

[Series 2: facialbone 2.0 st · axial · 0.35mm/px · z∈[+1334,+1510]mm · 8 of 104 slices shown, 10 images]
[im 8/104  brain]
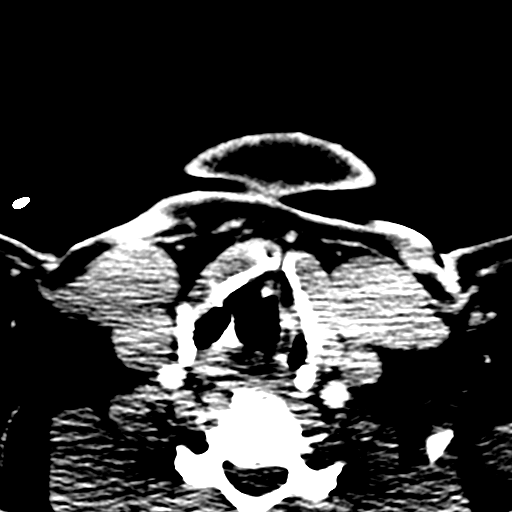
[im 8/104  bone]
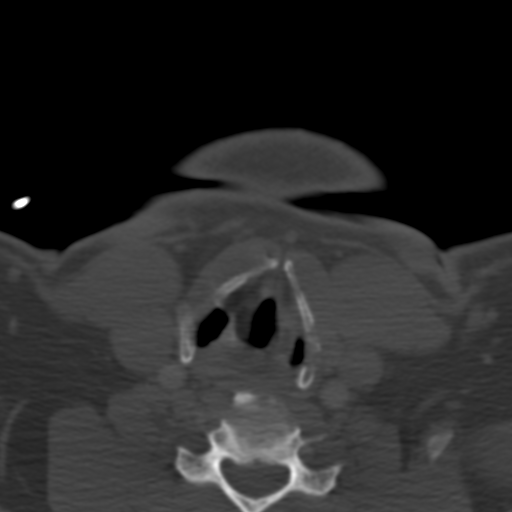
[im 22/104  bone]
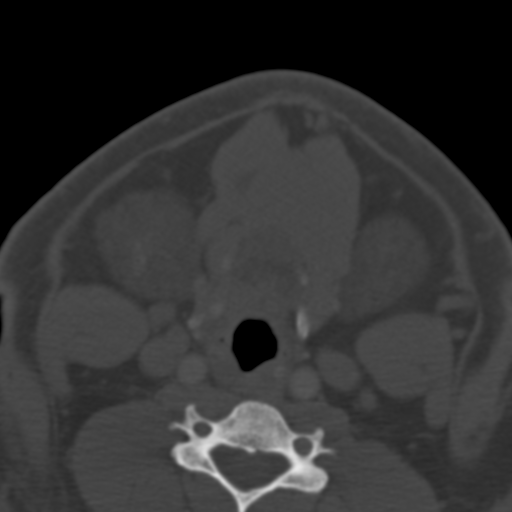
[im 32/104  bone]
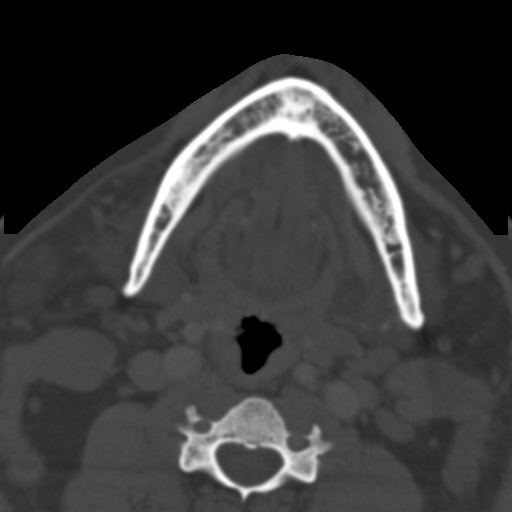
[im 47/104  bone]
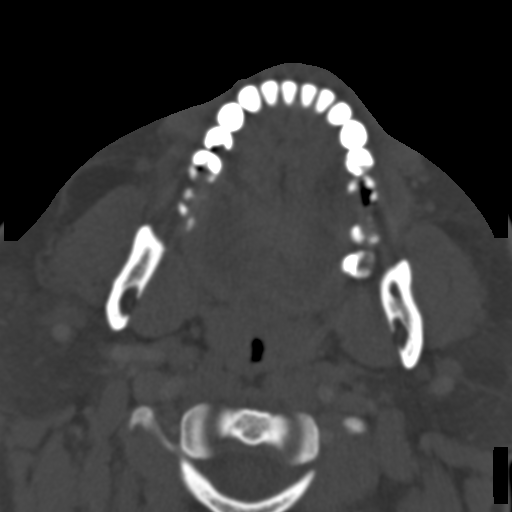
[im 57/104  brain]
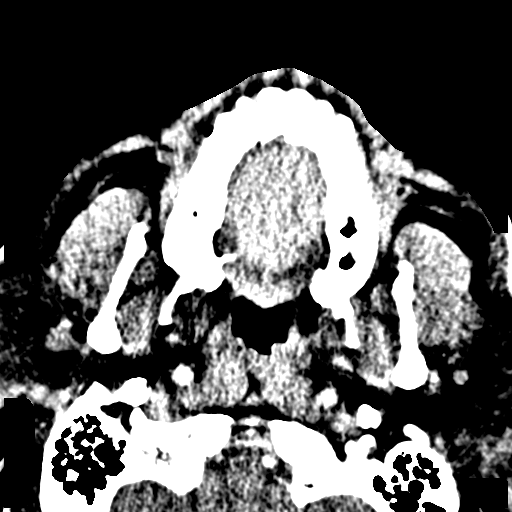
[im 57/104  bone]
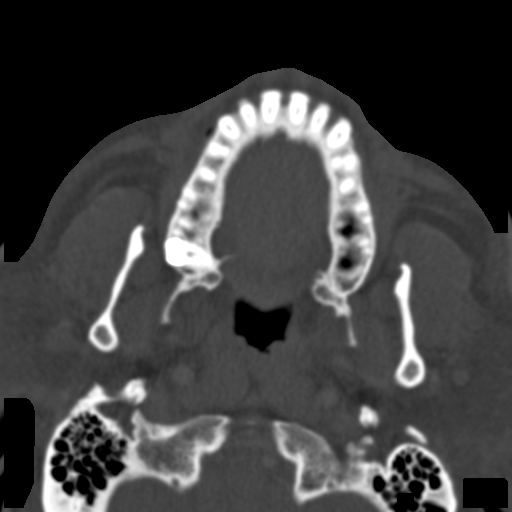
[im 72/104  bone]
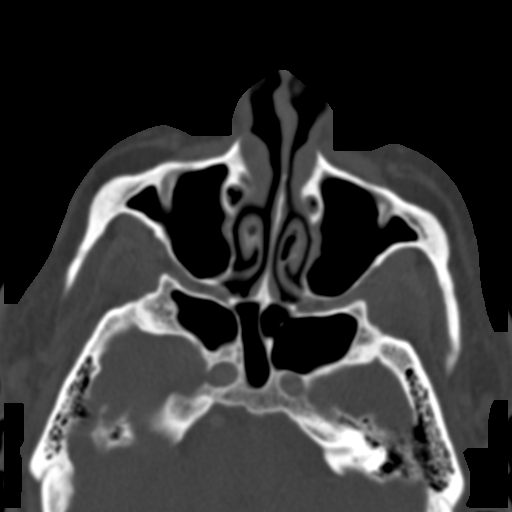
[im 82/104  bone]
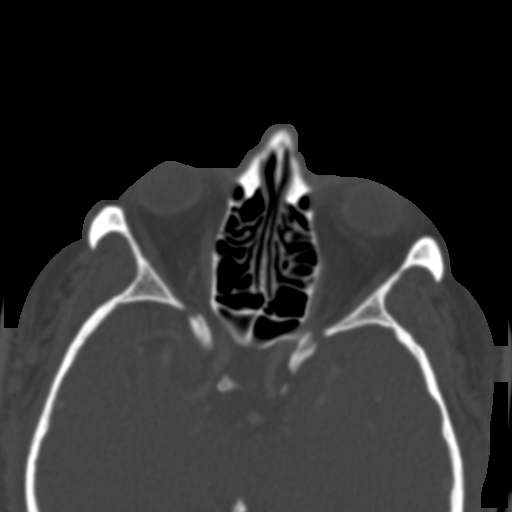
[im 96/104  bone]
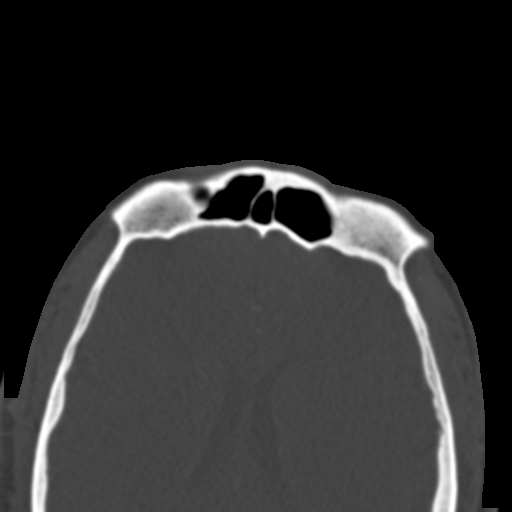

[Series 7: facialbone 2.0 cor st · coronal · 0.40mm/px · 3 of 138 slices shown]
[im 46/138  bone]
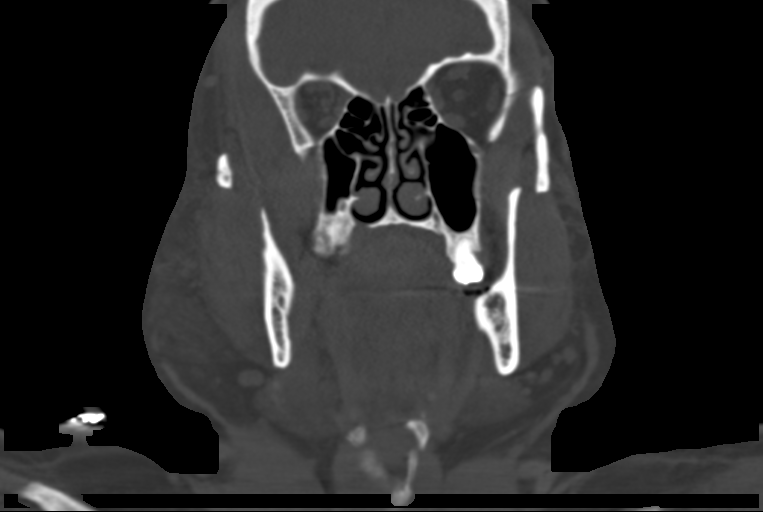
[im 61/138  bone]
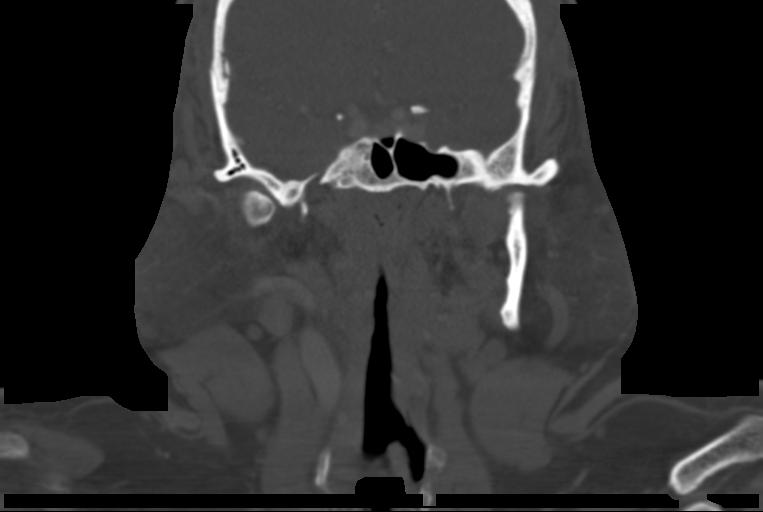
[im 77/138  bone]
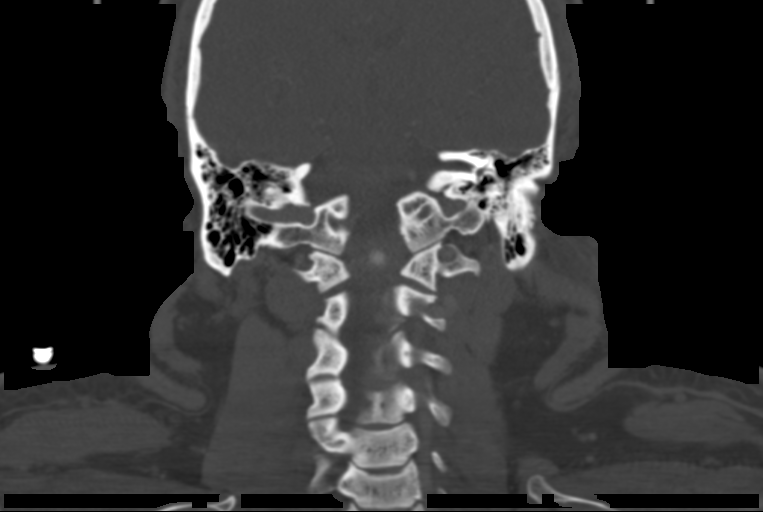

[Series 8: facialbone 2.0 sag st · sagittal · 0.40mm/px · 3 of 131 slices shown]
[im 44/131  bone]
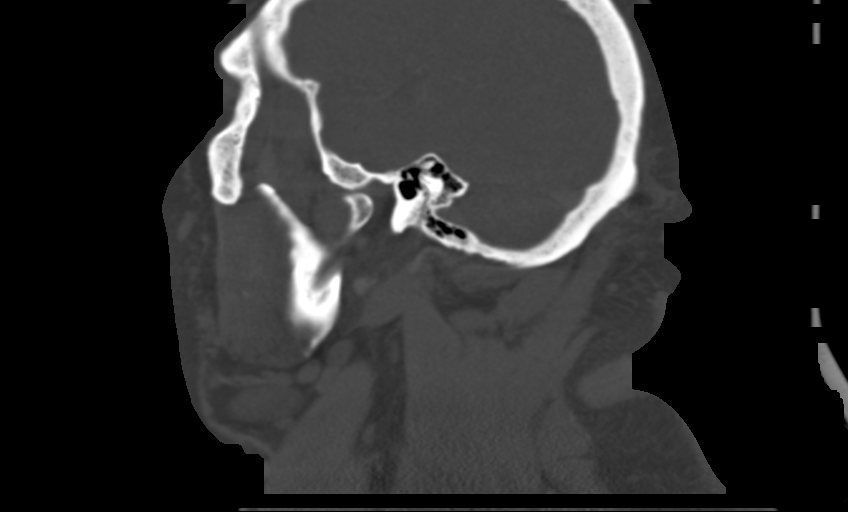
[im 66/131  bone]
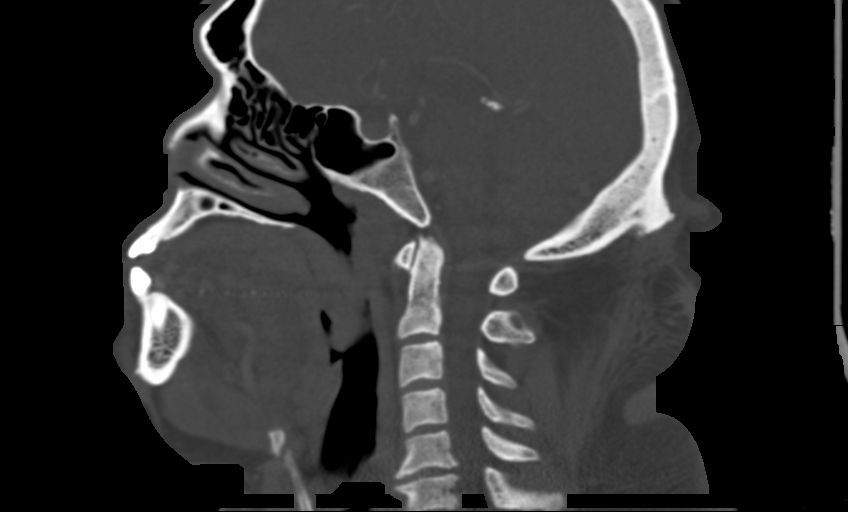
[im 87/131  bone]
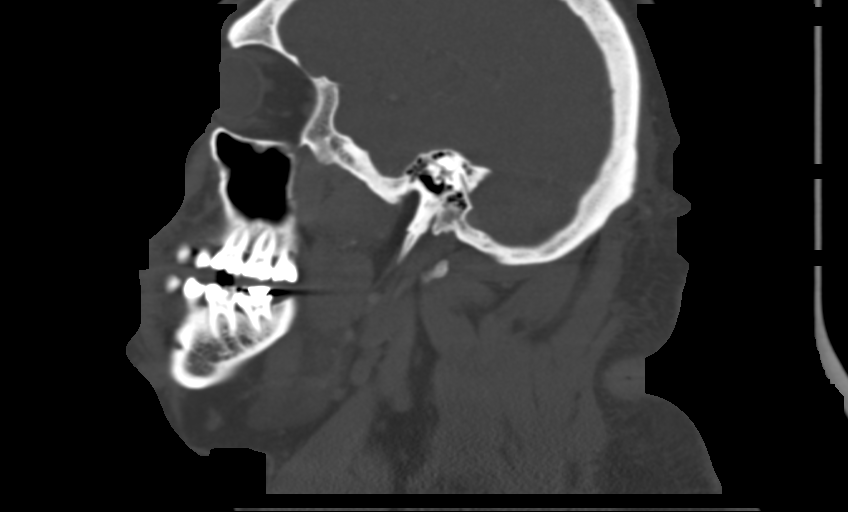

[14 of 47 positions shown; findings below may reference images not displayed]

FINDINGS: No abnormality identified in the visualized brain parenchyma. Major
vascular structures in the visible neck and at the skullbase appear
patent. Dominant left vertebral artery.

Negative visualized larynx, pharynx, parapharyngeal spaces,
retropharyngeal space, sublingual space, submandibular glands, and
parotid glands. No cervical lymphadenopathy. Bilateral orbit and
scalp soft tissues appear within normal limits. No fluid collection
identified.

Paranasal sinuses are clear aside from a small mucous retention cyst
in the anterior inferior left maxillary sinus. Tympanic cavities and
mastoids are clear. Left stylomastoid foramen appears normal.

Occasional dental caries. Mandible intact. Maxilla and orbital walls
intact. Zygoma and skullbase intact. Visualized cervical spine
intact. Chronic disc and endplate degeneration at C5-C6.
IMPRESSION: No acute or inflammatory findings identified. Occasional dental
caries.

## 2017-04-01 ENCOUNTER — Ambulatory Visit
Admission: RE | Admit: 2017-04-01 | Discharge: 2017-04-01 | Disposition: A | Discharge status: Home / Self Care | Payer: Medicare HMO | Source: Ambulatory Visit | Attending: Orthopedic Surgery | Admitting: Orthopedic Surgery

## 2017-04-01 DIAGNOSIS — M25461 Effusion, right knee: Secondary | ICD-10-CM | POA: Diagnosis not present

## 2017-04-01 DIAGNOSIS — M25561 Pain in right knee: Secondary | ICD-10-CM

## 2017-04-06 ENCOUNTER — Ambulatory Visit (INDEPENDENT_AMBULATORY_CARE_PROVIDER_SITE_OTHER): Payer: Medicare HMO

## 2017-04-06 ENCOUNTER — Ambulatory Visit (INDEPENDENT_AMBULATORY_CARE_PROVIDER_SITE_OTHER): Payer: Medicare HMO | Admitting: Orthopedic Surgery

## 2017-04-06 ENCOUNTER — Encounter (INDEPENDENT_AMBULATORY_CARE_PROVIDER_SITE_OTHER): Payer: Self-pay | Admitting: Orthopedic Surgery

## 2017-04-06 VITALS — Ht 75.0 in | Wt 380.0 lb

## 2017-04-06 DIAGNOSIS — M79672 Pain in left foot: Secondary | ICD-10-CM

## 2017-04-06 DIAGNOSIS — S82141A Displaced bicondylar fracture of right tibia, initial encounter for closed fracture: Secondary | ICD-10-CM | POA: Diagnosis not present

## 2017-04-06 DIAGNOSIS — M25571 Pain in right ankle and joints of right foot: Secondary | ICD-10-CM

## 2017-04-06 DIAGNOSIS — M1A0710 Idiopathic chronic gout, right ankle and foot, without tophus (tophi): Secondary | ICD-10-CM

## 2017-04-06 LAB — URIC ACID: URIC ACID, SERUM: 7.3 mg/dL (ref 4.0–8.0)

## 2017-04-06 MED ORDER — ALLOPURINOL 100 MG PO TABS: 100.0000 mg | ORAL_TABLET | Freq: Two times a day (BID) | ORAL | 3 refills | Status: DC

## 2017-04-06 MED ORDER — COLCHICINE 0.6 MG PO TABS: 0.6000 mg | ORAL_TABLET | Freq: Every day | ORAL | 12 refills | Status: DC

## 2017-04-06 NOTE — Progress Notes (Signed)
Office Visit Note   Patient: [name]           Date of Birth: Mar 02, 1971           MRN: [id number] Visit Date: 04/06/2017              Requested by: Rogers Blocker, MD [address] [place], [place] [address] PCP: Rogers Blocker, MD  Chief Complaint  Patient presents with  . Right Knee - Follow-up    MRI review  . Right Ankle - Pain      HPI: Patient presents complaining of medial joint line pain of the right knee status post nondisplaced lateral tibial plateau fracture patient also complains of right ankle pain. He states that all the family members had to lift him to get him to his doctor's appointment due to ankle and knee pain.  Patient states he does have a history of gout and has not taking gout medication.  Assessment & Plan: Visit Diagnoses:  1. Pain in right ankle and joints of right foot   2. Pain in left foot   3. Idiopathic chronic gout of right ankle without tophus   4. Tibial plateau fracture, right, closed, initial encounter     Plan: We'll obtain a uric acid level today to start him on colchicine 0.6 mg twice a day until symptoms resolve and then discontinue the colchicine he will take allopurinol 100 mg twice a day. We will evaluate her follow-up for repeat of his uric acid level. Patient may be weightbearing as tolerated with the Bledsoe brace on the right. Do not see an indication for surgical intervention of the right knee at this time.  Follow-Up Instructions: Return in about 2 weeks (around 04/20/2017).   Ortho Exam  Patient is alert, oriented, no adenopathy, well-dressed, normal affect, normal respiratory effort. Examination patient's right knee is not tender to palpation over the medial or lateral joint line. Patient's MRI scan is reviewed which shows a well-healing lateral tibial plateau fracture displacement of 1 mm. There is a free edge tear of the lateral meniscus but patient has no mechanical symptoms this does not appear to be  symptomatic. Examination of right ankle he is tender to palpation anteriorly over the joint line he does have swelling. There is no redness no cellulitis. Clinically patient's knee and ankle pain appears to be mostly due to gout.  Imaging: Xr Ankle 2 Views Right  Result Date: 04/06/2017 Two-view radiographs of the right ankle shows no bony abnormalities no joint space collapse no subchondral sclerosis or cysts.   Labs: Lab Results  Component Value Date   HGBA1C 9.6 (H) 08/11/2015   HGBA1C 9.2 (H) 05/15/2014   HGBA1C 8.3 (H) 02/20/2014   ESRSEDRATE 21 03/30/2010   LABURIC 10.2 (H) 03/30/2010   LABURIC 9.3 (H) 06/16/2009   REPTSTATUS 09/08/2013 FINAL 09/06/2013   CULT NO GROWTH Performed at [facility] 09/06/2013   LABORGA NO GROWTH 08/11/2015    Orders:  Orders Placed This Encounter  Procedures  . XR Ankle 2 Views Right   No orders of the defined types were placed in this encounter.    Procedures: No procedures performed  Clinical Data: No additional findings.  ROS:  All other systems negative, except as noted in the HPI. Review of Systems  Objective: Vital Signs: Ht 6\' 3"  (1.905 m)   Wt (!) 380 lb (172.4 kg)   BMI 47.50 kg/m   Specialty Comments:  No specialty comments available.  PMFS History: Patient Active Problem List   Diagnosis Date Noted  . Preventative health care 08/11/2015  . HTN (hypertension) 12/20/2012  . Recurrent boils 12/20/2012  . OBSTRUCTIVE SLEEP APNEA 08/13/2010  . ANA POSITIVE 04/01/2010  . ERECTILE DYSFUNCTION, ORGANIC 03/30/2010  . Idiopathic chronic gout of right ankle without tophus 06/16/2009  . HYPERLIPIDEMIA 11/14/2008  . MORBID OBESITY 10/17/2008  . Diabetes mellitus type II, uncontrolled ([place]) 03/01/2007  . PROTEINURIA 03/01/2007  . SCHIZOPHRENIA 02/26/2007  . Bipolar disorder, unspecified ([place]) 02/26/2007   Past Medical History:  Diagnosis Date  . Ankle fracture, right   . Bell's palsy   . Bipolar  affective disorder ([place])   . Diabetes mellitus   . Gout   . Hyperlipidemia    Per pt - reported on 03/07/12  . Hypertension   . Obesity   . Sleep apnea     Family History  Problem Relation Age of Onset  . Hypertension Mother   . Diabetes Mother   . Hyperlipidemia Mother   . Pancreatitis Father   . Arthritis Father   . Heart attack Brother        46  . Heart disease Brother        MI  . Heart attack Maternal Grandmother   . Cancer Maternal Grandmother        breast  . Kidney disease Sister        dialysis  . Lupus Sister   . Stroke Sister   . Diabetes Maternal Aunt   . Heart disease Maternal Aunt   . Diabetes Paternal Aunt   . Heart disease Paternal Aunt        pacemaker  . Heart disease Paternal Uncle        pacemaker  . Diabetes Paternal Grandmother   . Stroke Paternal Aunt   . COPD Maternal Aunt   . Heart disease Maternal Aunt   . Throat cancer Unknown        aunt  . Kidney failure Unknown     Past Surgical History:  Procedure Laterality Date  . KELOID EXCISION    . VASECTOMY     Social History   Occupational History  . disability    Social History Main Topics  . Smoking status: Current Every Day Smoker    Packs/day: 0.50    Years: 25.00    Types: Cigarettes  . Smokeless tobacco: Never Used  . Alcohol use No  . Drug use: No  . Sexual activity: Not Currently    Partners: Female

## 2017-04-06 NOTE — Addendum Note (Signed)
Addended by: Pamella Pert on: 04/06/2017 11:30 AM   Modules accepted: Orders

## 2017-04-07 ENCOUNTER — Telehealth (INDEPENDENT_AMBULATORY_CARE_PROVIDER_SITE_OTHER): Payer: Self-pay | Admitting: Radiology

## 2017-04-07 NOTE — Telephone Encounter (Signed)
-----   Message from Newt Minion, MD sent at 04/07/2017  9:07 AM EDT ----- Uric acid better at 7.3 ideally would like to get this below 6.

## 2017-04-07 NOTE — Telephone Encounter (Signed)
I called and spoke with patient to advise of message below regarding labs. Advised to continue with gout medication to continue helping improve his number.

## 2017-04-20 ENCOUNTER — Ambulatory Visit (INDEPENDENT_AMBULATORY_CARE_PROVIDER_SITE_OTHER): Payer: Medicare HMO | Admitting: Orthopedic Surgery

## 2017-06-03 ENCOUNTER — Emergency Department (HOSPITAL_COMMUNITY): Payer: Medicare HMO

## 2017-06-03 ENCOUNTER — Encounter (HOSPITAL_COMMUNITY): Payer: Self-pay | Admitting: Emergency Medicine

## 2017-06-03 DIAGNOSIS — M25511 Pain in right shoulder: Secondary | ICD-10-CM | POA: Diagnosis not present

## 2017-06-03 DIAGNOSIS — Z7984 Long term (current) use of oral hypoglycemic drugs: Secondary | ICD-10-CM | POA: Insufficient documentation

## 2017-06-03 DIAGNOSIS — F17210 Nicotine dependence, cigarettes, uncomplicated: Secondary | ICD-10-CM | POA: Diagnosis not present

## 2017-06-03 DIAGNOSIS — I10 Essential (primary) hypertension: Secondary | ICD-10-CM | POA: Insufficient documentation

## 2017-06-03 DIAGNOSIS — E119 Type 2 diabetes mellitus without complications: Secondary | ICD-10-CM | POA: Diagnosis not present

## 2017-06-03 DIAGNOSIS — M7541 Impingement syndrome of right shoulder: Secondary | ICD-10-CM | POA: Diagnosis not present

## 2017-06-03 NOTE — ED Triage Notes (Signed)
Reports pain in right shoulder since yesterday.  Doesn't think he injured it.  Does endorse having knee issues and walking with a cane using that arm.  No relief with use of ibuprofen.

## 2017-06-04 ENCOUNTER — Emergency Department (HOSPITAL_COMMUNITY)
Admission: EM | Admit: 2017-06-04 | Discharge: 2017-06-04 | Disposition: A | Discharge status: Home / Self Care | Payer: Medicare HMO | Attending: Emergency Medicine | Admitting: Emergency Medicine

## 2017-06-04 DIAGNOSIS — M7541 Impingement syndrome of right shoulder: Secondary | ICD-10-CM

## 2017-06-04 MED ORDER — HYDROCODONE-ACETAMINOPHEN 5-325 MG PO TABS: 1.0000 | ORAL_TABLET | Freq: Four times a day (QID) | ORAL | 0 refills | Status: DC | PRN

## 2017-06-04 NOTE — ED Notes (Signed)
E-signature not available, pt verbalized understanding of DC instructions  

## 2017-06-04 NOTE — ED Provider Notes (Signed)
[facility] DEPT Provider Note   CSN: [id number] Arrival date & time: 06/03/17  2246     History   Chief Complaint Chief Complaint  Patient presents with  . Shoulder Pain    HPI [name] is a 46 y.o. male.  Patient presents to the ED with a chief complaint of right shoulder pain.  He states that he has had increasing pain in his right shoulder for the past 3 days.  He states that he has been using a cane because of a bad leg, and thinks this may be contributing.  He also suggests that he may have slept on it wrong.  He denies any fevers, chills, rash, or traumatic injury.  Denies any weakness, numbness, or tingling.   The history is provided by the patient. No language interpreter was used.    Past Medical History:  Diagnosis Date  . Ankle fracture, right   . Bell's palsy   . Bipolar affective disorder ([place])   . Diabetes mellitus   . Gout   . Hyperlipidemia    Per pt - reported on 03/07/12  . Hypertension   . Obesity   . Sleep apnea     Patient Active Problem List   Diagnosis Date Noted  . Preventative health care 08/11/2015  . HTN (hypertension) 12/20/2012  . Recurrent boils 12/20/2012  . OBSTRUCTIVE SLEEP APNEA 08/13/2010  . ANA POSITIVE 04/01/2010  . ERECTILE DYSFUNCTION, ORGANIC 03/30/2010  . Idiopathic chronic gout of right ankle without tophus 06/16/2009  . HYPERLIPIDEMIA 11/14/2008  . MORBID OBESITY 10/17/2008  . Diabetes mellitus type II, uncontrolled ([place]) 03/01/2007  . PROTEINURIA 03/01/2007  . SCHIZOPHRENIA 02/26/2007  . Bipolar disorder, unspecified ([place]) 02/26/2007    Past Surgical History:  Procedure Laterality Date  . KELOID EXCISION    . VASECTOMY         Home Medications    Prior to Admission medications   Medication Sig Start Date End Date Taking? Authorizing Provider  allopurinol (ZYLOPRIM) 100 MG tablet Take 1 tablet (100 mg total) by mouth 2 (two) times daily. 04/06/17   Newt Minion, MD  atorvastatin (LIPITOR) 40 MG  tablet Take 1 tablet (40 mg total) by mouth daily. 08/11/15   Roma Schanz R, DO  colchicine 0.6 MG tablet Take 1 tablet (0.6 mg total) by mouth daily. 04/06/17   Newt Minion, MD  HYDROcodone-acetaminophen (NORCO/VICODIN) 5-325 MG tablet Take 1-2 tablets by mouth every 6 (six) hours as needed. 06/04/17   Montine Circle, PA-C  ibuprofen (ADVIL,MOTRIN) 200 MG tablet Take 600 mg by mouth every 6 (six) hours as needed for moderate pain.    [provider]  metFORMIN (GLUCOPHAGE) 500 MG tablet Take 500 mg by mouth 2 (two) times daily with a meal.    [provider]  oxyCODONE-acetaminophen (PERCOCET) 5-325 MG tablet Take 1 tablet by mouth every 8 (eight) hours as needed for moderate pain or severe pain. 02/01/17   Suzan Slick, NP    Family History Family History  Problem Relation Age of Onset  . Hypertension Mother   . Diabetes Mother   . Hyperlipidemia Mother   . Pancreatitis Father   . Arthritis Father   . Heart attack Brother        27  . Heart disease Brother        MI  . Heart attack Maternal Grandmother   . Cancer Maternal Grandmother        breast  . Kidney disease  Sister        dialysis  . Lupus Sister   . Stroke Sister   . Diabetes Maternal Aunt   . Heart disease Maternal Aunt   . Diabetes Paternal Aunt   . Heart disease Paternal Aunt        pacemaker  . Heart disease Paternal Uncle        pacemaker  . Diabetes Paternal Grandmother   . Stroke Paternal Aunt   . COPD Maternal Aunt   . Heart disease Maternal Aunt   . Throat cancer Unknown        aunt  . Kidney failure Unknown     Social History Social History  Substance Use Topics  . Smoking status: Current Every Day Smoker    Packs/day: 0.50    Years: 25.00    Types: Cigarettes  . Smokeless tobacco: Never Used  . Alcohol use No     Allergies   Cucumber extract   Review of Systems Review of Systems  All other systems reviewed and are negative.    Physical Exam Updated  Vital Signs BP 108/71 (BP Location: Left Arm)   Pulse (!) 55   Temp 97.7 F (36.5 C) (Oral)   Resp 18   Ht 6\' 3"  (1.905 m)   Wt (!) 163.3 kg (360 lb)   SpO2 100%   BMI 45.00 kg/m   Physical Exam Nursing note and vitals reviewed.  Constitutional: Pt appears well-developed and well-nourished. No distress.  HENT:  Head: Normocephalic and atraumatic.  Eyes: Conjunctivae are normal.  Neck: Normal range of motion.  Cardiovascular: Normal rate, regular rhythm. Intact distal pulses.   Capillary refill < 3 sec.  Pulmonary/Chest: Effort normal and breath sounds normal.  Musculoskeletal:  Right shoulder Pt exhibits pain with ROM, positive hawkins-kennedy, no bony abnormality or deformity.   ROM: 4/5  Strength: 4/5  Neurological: Pt  is alert. Coordination normal.  Sensation: 5/5 Skin: Skin is warm and dry. Pt is not diaphoretic.  No evidence of open wound or skin tenting Psychiatric: Pt has a normal mood and affect.     ED Treatments / Results  Labs (all labs ordered are listed, but only abnormal results are displayed) Labs Reviewed - No data to display  EKG  EKG Interpretation None       Radiology Dg Shoulder Right  Result Date: 06/03/2017 CLINICAL DATA:  Pain in the right shoulder EXAM: RIGHT SHOULDER - 2+ VIEW COMPARISON:  None. FINDINGS: There is no evidence of fracture or dislocation. There is no evidence of arthropathy or other focal bone abnormality. Soft tissues are unremarkable. IMPRESSION: Negative. Electronically Signed   By: Donavan Foil M.D.   On: 06/03/2017 23:49    Procedures Procedures (including critical care time)  Medications Ordered in ED Medications - No data to display   Initial Impression / Assessment and Plan / ED Course  I have reviewed the triage vital signs and the nursing notes.  Pertinent labs & imaging results that were available during my care of the patient were reviewed by me and considered in my medical decision making (see chart  for details).     Patient X-Ray negative for obvious fracture or dislocation.  Pt advised to follow up with orthopedics. Patient given conservative therapy. Patient will be discharged home & is agreeable with above plan. Returns precautions discussed. Pt appears safe for discharge.   Final Clinical Impressions(s) / ED Diagnoses   Final diagnoses:  Impingement syndrome of right shoulder  New Prescriptions New Prescriptions   HYDROCODONE-ACETAMINOPHEN (NORCO/VICODIN) 5-325 MG TABLET    Take 1-2 tablets by mouth every 6 (six) hours as needed.     Montine Circle, PA-C 06/04/17 0623    Varney Biles, MD 06/04/17 [phone]

## 2017-07-13 DIAGNOSIS — I10 Essential (primary) hypertension: Secondary | ICD-10-CM | POA: Diagnosis not present

## 2017-07-13 DIAGNOSIS — E119 Type 2 diabetes mellitus without complications: Secondary | ICD-10-CM | POA: Diagnosis not present

## 2018-04-27 ENCOUNTER — Other Ambulatory Visit (INDEPENDENT_AMBULATORY_CARE_PROVIDER_SITE_OTHER): Payer: Self-pay | Admitting: Orthopedic Surgery

## 2018-05-10 DIAGNOSIS — E119 Type 2 diabetes mellitus without complications: Secondary | ICD-10-CM | POA: Diagnosis not present

## 2018-05-10 DIAGNOSIS — X58XXXA Exposure to other specified factors, initial encounter: Secondary | ICD-10-CM | POA: Diagnosis not present

## 2018-05-10 DIAGNOSIS — M25572 Pain in left ankle and joints of left foot: Secondary | ICD-10-CM | POA: Diagnosis not present

## 2018-05-10 DIAGNOSIS — M109 Gout, unspecified: Secondary | ICD-10-CM | POA: Diagnosis not present

## 2018-05-10 DIAGNOSIS — R2242 Localized swelling, mass and lump, left lower limb: Secondary | ICD-10-CM | POA: Diagnosis not present

## 2018-05-10 DIAGNOSIS — S99912A Unspecified injury of left ankle, initial encounter: Secondary | ICD-10-CM | POA: Diagnosis not present

## 2018-05-10 DIAGNOSIS — M79671 Pain in right foot: Secondary | ICD-10-CM | POA: Diagnosis not present

## 2018-05-10 DIAGNOSIS — S93412A Sprain of calcaneofibular ligament of left ankle, initial encounter: Secondary | ICD-10-CM | POA: Diagnosis not present

## 2018-10-04 ENCOUNTER — Emergency Department (HOSPITAL_COMMUNITY): Payer: Medicare Other

## 2018-10-04 ENCOUNTER — Encounter (HOSPITAL_COMMUNITY): Payer: Self-pay | Admitting: Emergency Medicine

## 2018-10-04 ENCOUNTER — Other Ambulatory Visit: Payer: Self-pay

## 2018-10-04 ENCOUNTER — Emergency Department (HOSPITAL_COMMUNITY)
Admission: EM | Admit: 2018-10-04 | Discharge: 2018-10-04 | Disposition: A | Discharge status: Home / Self Care | Payer: Medicare Other | Attending: Emergency Medicine | Admitting: Emergency Medicine

## 2018-10-04 DIAGNOSIS — Y998 Other external cause status: Secondary | ICD-10-CM | POA: Insufficient documentation

## 2018-10-04 DIAGNOSIS — S63502A Unspecified sprain of left wrist, initial encounter: Secondary | ICD-10-CM | POA: Diagnosis not present

## 2018-10-04 DIAGNOSIS — E119 Type 2 diabetes mellitus without complications: Secondary | ICD-10-CM | POA: Insufficient documentation

## 2018-10-04 DIAGNOSIS — F17210 Nicotine dependence, cigarettes, uncomplicated: Secondary | ICD-10-CM | POA: Diagnosis not present

## 2018-10-04 DIAGNOSIS — I10 Essential (primary) hypertension: Secondary | ICD-10-CM | POA: Insufficient documentation

## 2018-10-04 DIAGNOSIS — X500XXA Overexertion from strenuous movement or load, initial encounter: Secondary | ICD-10-CM | POA: Diagnosis not present

## 2018-10-04 DIAGNOSIS — Z79899 Other long term (current) drug therapy: Secondary | ICD-10-CM | POA: Diagnosis not present

## 2018-10-04 DIAGNOSIS — F209 Schizophrenia, unspecified: Secondary | ICD-10-CM | POA: Insufficient documentation

## 2018-10-04 DIAGNOSIS — S6992XA Unspecified injury of left wrist, hand and finger(s), initial encounter: Secondary | ICD-10-CM | POA: Diagnosis present

## 2018-10-04 DIAGNOSIS — Y9389 Activity, other specified: Secondary | ICD-10-CM | POA: Insufficient documentation

## 2018-10-04 DIAGNOSIS — Y929 Unspecified place or not applicable: Secondary | ICD-10-CM | POA: Insufficient documentation

## 2018-10-04 DIAGNOSIS — Z7984 Long term (current) use of oral hypoglycemic drugs: Secondary | ICD-10-CM | POA: Diagnosis not present

## 2018-10-04 DIAGNOSIS — G510 Bell's palsy: Secondary | ICD-10-CM | POA: Insufficient documentation

## 2018-10-04 DIAGNOSIS — F319 Bipolar disorder, unspecified: Secondary | ICD-10-CM | POA: Insufficient documentation

## 2018-10-04 MED ORDER — ACETAMINOPHEN 500 MG PO TABS
1000.0000 mg | ORAL_TABLET | Freq: Once | ORAL | Status: AC
  Administered 2018-10-04: 1000 mg via ORAL
  Filled 2018-10-04: qty 2

## 2018-10-04 NOTE — Discharge Instructions (Signed)
You can take Tylenol or Ibuprofen as directed for pain. You can alternate Tylenol and Ibuprofen every 4 hours. If you take Tylenol at 1pm, then you can take Ibuprofen at 5pm. Then you can take Tylenol again at 9pm.   Follow the RICE (Rest, Ice, Compression, Elevation) protocol as directed.   Wear the splint for support and stabilization.  Follow-up with your primary care doctor as needed.  Return to emergency department for any worsening pain, redness, warmth of the wrist, fevers, numbness/weakness or any other worsening or concerning symptoms.

## 2018-10-04 NOTE — ED Triage Notes (Signed)
Patient c/o left wrist pain x30 minutes after trying to move furniture.

## 2018-10-04 NOTE — ED Notes (Signed)
Orthopedic tech called for sling and splint

## 2018-10-04 NOTE — ED Provider Notes (Signed)
[facility] DEPT Provider Note   CSN: [id number] Arrival date & time: 10/04/18  1646     History   Chief Complaint Chief Complaint  Patient presents with  . Wrist Pain    HPI [name] is a 48 y.o. male who presents for evaluation of left wrist pain that began approximate 4:30 PM this afternoon.  Patient reports that he was lifting up but furniture and when he did so, he felt a pop in his wrist and had pain.  He states that nothing fell on his wrist.  Patient reports that he has continued to have pain and swelling since then.  Patient has not taken medication for the pain.  Patient denies any numbness/weakness.  The history is provided by the patient.    Past Medical History:  Diagnosis Date  . Ankle fracture, right   . Bell's palsy   . Bipolar affective disorder ([place])   . Diabetes mellitus   . Gout   . Hyperlipidemia    Per pt - reported on 03/07/12  . Hypertension   . Obesity   . Sleep apnea     Patient Active Problem List   Diagnosis Date Noted  . Preventative health care 08/11/2015  . HTN (hypertension) 12/20/2012  . Recurrent boils 12/20/2012  . OBSTRUCTIVE SLEEP APNEA 08/13/2010  . ANA POSITIVE 04/01/2010  . ERECTILE DYSFUNCTION, ORGANIC 03/30/2010  . Idiopathic chronic gout of right ankle without tophus 06/16/2009  . HYPERLIPIDEMIA 11/14/2008  . MORBID OBESITY 10/17/2008  . Diabetes mellitus type II, uncontrolled ([place]) 03/01/2007  . PROTEINURIA 03/01/2007  . SCHIZOPHRENIA 02/26/2007  . Bipolar disorder, unspecified ([place]) 02/26/2007    Past Surgical History:  Procedure Laterality Date  . KELOID EXCISION    . VASECTOMY          Home Medications    Prior to Admission medications   Medication Sig Start Date End Date Taking? Authorizing Provider  allopurinol (ZYLOPRIM) 100 MG tablet Take 1 tablet (100 mg total) by mouth 2 (two) times daily. 04/06/17   Newt Minion, MD  atorvastatin (LIPITOR) 40 MG tablet Take 1  tablet (40 mg total) by mouth daily. 08/11/15   Roma Schanz R, DO  colchicine 0.6 MG tablet TAKE 1 TABLET BY MOUTH EVERY DAY 04/27/18   Newt Minion, MD  HYDROcodone-acetaminophen (NORCO/VICODIN) 5-325 MG tablet Take 1-2 tablets by mouth every 6 (six) hours as needed. 06/04/17   Montine Circle, PA-C  ibuprofen (ADVIL,MOTRIN) 200 MG tablet Take 600 mg by mouth every 6 (six) hours as needed for moderate pain.    [provider]  metFORMIN (GLUCOPHAGE) 500 MG tablet Take 500 mg by mouth 2 (two) times daily with a meal.    [provider]  oxyCODONE-acetaminophen (PERCOCET) 5-325 MG tablet Take 1 tablet by mouth every 8 (eight) hours as needed for moderate pain or severe pain. 02/01/17   Suzan Slick, NP    Family History Family History  Problem Relation Age of Onset  . Hypertension Mother   . Diabetes Mother   . Hyperlipidemia Mother   . Pancreatitis Father   . Arthritis Father   . Heart attack Brother        29  . Heart disease Brother        MI  . Heart attack Maternal Grandmother   . Cancer Maternal Grandmother        breast  . Kidney disease Sister        dialysis  .  Lupus Sister   . Stroke Sister   . Diabetes Maternal Aunt   . Heart disease Maternal Aunt   . Diabetes Paternal Aunt   . Heart disease Paternal Aunt        pacemaker  . Heart disease Paternal Uncle        pacemaker  . Diabetes Paternal Grandmother   . Stroke Paternal Aunt   . COPD Maternal Aunt   . Heart disease Maternal Aunt   . Throat cancer Other        aunt  . Kidney failure Other     Social History Social History   Tobacco Use  . Smoking status: Current Every Day Smoker    Packs/day: 0.50    Years: 25.00    Pack years: 12.50    Types: Cigarettes  . Smokeless tobacco: Never Used  Substance Use Topics  . Alcohol use: No  . Drug use: No     Allergies   Cucumber extract   Review of Systems Review of Systems  Musculoskeletal:       Wrist pain  Neurological:  Negative for weakness and numbness.  All other systems reviewed and are negative.    Physical Exam Updated Vital Signs BP (!) 149/79   Pulse 62   Temp 98.9 F (37.2 C) (Oral)   Resp 18   SpO2 99%   Physical Exam Vitals signs and nursing note reviewed.  Constitutional:      Appearance: He is well-developed.  HENT:     Head: Normocephalic and atraumatic.  Eyes:     General: No scleral icterus.       Right eye: No discharge.        Left eye: No discharge.     Conjunctiva/sclera: Conjunctivae normal.  Cardiovascular:     Pulses:          Radial pulses are 2+ on the right side and 2+ on the left side.  Pulmonary:     Effort: Pulmonary effort is normal.  Musculoskeletal:     Comments: Tenderness palpation noted to the left wrist.  Most of his tenderness is noted at the distal radius of the wrist.  No snuffbox tenderness noted.  Mild overlying soft tissue swelling noted to the volar aspect of the left wrist.  No overlying warmth, erythema, edema.  Flexion/tension intact but with some subjective reports of pain.  Patient able to wiggle all 5 digits of left hand without any difficulty.  Patient reports that he is able to make a fist but does report some pain in the wrist when doing so.  No bony tenderness noted to forearm, elbow.  No tenderness palpation of the right upper extremity.  Range of motion right upper extremity with any difficulty.  Skin:    General: Skin is warm and dry.     Capillary Refill: Capillary refill takes less than 2 seconds.     Comments: Good distal cap refill.  LUE is not dusky in appearance or cool to touch.  Neurological:     Mental Status: He is alert.     Comments: Sensation intact along major nerve distributions of BUE  Psychiatric:        Speech: Speech normal.        Behavior: Behavior normal.      ED Treatments / Results  Labs (all labs ordered are listed, but only abnormal results are displayed) Labs Reviewed - No data to  display  EKG None  Radiology Dg Wrist Complete Left  Result Date: 10/04/2018 CLINICAL DATA:  Left wrist pain for 30 minutes small trying to move furniture. EXAM: LEFT WRIST - COMPLETE 3+ VIEW COMPARISON:  None. FINDINGS: There is no evidence of acute fracture or dislocation. Lucency at the base of the ulnar styloid with sclerotic margins likely reflects an old remote injury/fracture. Incomplete osseous union is noted. There is no evidence of arthropathy or other focal bone abnormality. Soft tissues are unremarkable. IMPRESSION: Lucency at the base of the ulnar styloid with sclerotic margins likely reflects an old remote injury/fracture. Incomplete osseous union is noted. No acute fracture or joint dislocation of the left wrist. Electronically Signed   By: Ashley Royalty M.D.   On: 10/04/2018 17:29    Procedures Procedures (including critical care time)  Medications Ordered in ED Medications  acetaminophen (TYLENOL) tablet 1,000 mg (1,000 mg Oral Given 10/04/18 1842)     Initial Impression / Assessment and Plan / ED Course  I have reviewed the triage vital signs and the nursing notes.  Pertinent labs & imaging results that were available during my care of the patient were reviewed by me and considered in my medical decision making (see chart for details).     48 year old male who presents for evaluation of wrist pain after lifting of furniture.  Also noted some overlying soft tissue swelling.  No numbness/weakness. Patient is afebrile, non-toxic appearing, sitting comfortably on examination table. Vital signs reviewed and stable. Patient is neurovascularly intact.  Tenderness palpation in distal aspect of the radius with some very mild overlying soft tissue swelling.  No overlying warmth, erythema.  Good range of motion though with some subjective reports of pain.  Consider a sprain.  Low suspicion for fracture dislocation.  History/physical exam is not concerning for septic arthritis, acute  arterial embolism, DVT of upper extremity.  X-ray reviewed.  No evidence of any acute fracture dislocation.  There is mention of lucency at the base of the ulnar styloid region with sclerotic margins that could reflect an old injury.  Patient has no tenderness palpation to this area.  Discussed results with patient.  We will plan to put in splint for support and stabilization.  Encourage at home supportive care measures. Patient had ample opportunity for questions and discussion. All patient's questions were answered with full understanding. Strict return precautions discussed. Patient expresses understanding and agreement to plan.   Portions of this note were generated with [employer]. Dictation errors may occur despite best attempts at proofreading.   Final Clinical Impressions(s) / ED Diagnoses   Final diagnoses:  Sprain of left wrist, initial encounter    ED Discharge Orders    None       Desma Mcgregor 10/04/18 1958    Drenda Freeze, MD 10/04/18 [phone]

## 2019-02-05 ENCOUNTER — Other Ambulatory Visit (HOSPITAL_BASED_OUTPATIENT_CLINIC_OR_DEPARTMENT_OTHER): Payer: Self-pay

## 2019-02-05 DIAGNOSIS — G4733 Obstructive sleep apnea (adult) (pediatric): Secondary | ICD-10-CM

## 2019-02-05 DIAGNOSIS — E669 Obesity, unspecified: Secondary | ICD-10-CM

## 2019-03-19 ENCOUNTER — Other Ambulatory Visit (HOSPITAL_COMMUNITY)
Admission: RE | Admit: 2019-03-19 | Discharge: 2019-03-19 | Disposition: A | Discharge status: Home / Self Care | Payer: Medicare Other | Source: Ambulatory Visit | Attending: Internal Medicine | Admitting: Internal Medicine

## 2019-03-19 DIAGNOSIS — Z1159 Encounter for screening for other viral diseases: Secondary | ICD-10-CM | POA: Insufficient documentation

## 2019-03-19 LAB — SARS CORONAVIRUS 2 (TAT 6-24 HRS): SARS Coronavirus 2: NEGATIVE

## 2019-03-22 ENCOUNTER — Ambulatory Visit (HOSPITAL_BASED_OUTPATIENT_CLINIC_OR_DEPARTMENT_OTHER): Payer: Medicare Other | Attending: Internal Medicine | Admitting: Internal Medicine

## 2019-03-22 ENCOUNTER — Other Ambulatory Visit: Payer: Self-pay

## 2019-03-22 DIAGNOSIS — G4733 Obstructive sleep apnea (adult) (pediatric): Secondary | ICD-10-CM | POA: Diagnosis not present

## 2019-03-22 DIAGNOSIS — E669 Obesity, unspecified: Secondary | ICD-10-CM

## 2019-03-25 ENCOUNTER — Other Ambulatory Visit: Payer: Self-pay

## 2019-03-25 ENCOUNTER — Other Ambulatory Visit (HOSPITAL_BASED_OUTPATIENT_CLINIC_OR_DEPARTMENT_OTHER): Payer: Self-pay

## 2019-03-25 DIAGNOSIS — G4733 Obstructive sleep apnea (adult) (pediatric): Secondary | ICD-10-CM

## 2019-03-25 DIAGNOSIS — E669 Obesity, unspecified: Secondary | ICD-10-CM

## 2019-04-03 DIAGNOSIS — E669 Obesity, unspecified: Secondary | ICD-10-CM | POA: Diagnosis not present

## 2019-04-03 DIAGNOSIS — G4733 Obstructive sleep apnea (adult) (pediatric): Secondary | ICD-10-CM | POA: Diagnosis not present

## 2019-04-03 NOTE — Procedures (Signed)
   Patient Name: [name], [name] Date: 03/22/2019 Gender: Male D.O.B: 04/14/1971 Age (years): 47 Referring Provider: Kevan Ny Height (inches): 76 Interpreting Physician: Baird Lyons MD, ABSM Weight (lbs): 370 RPSGT: Baxter Flattery BMI: 12 MRN: [id number] Neck Size: 19.50  CLINICAL INFORMATION Sleep Study Type: NPSG Indication for sleep study: Obesity, Snoring, Witnesses Apnea / Gasping During Sleep Epworth Sleepiness Score: 5  SLEEP STUDY TECHNIQUE As per the AASM Manual for the Scoring of Sleep and Associated Events v2.3 (April 2016) with a hypopnea requiring 4% desaturations.  The channels recorded and monitored were frontal, central and occipital EEG, electrooculogram (EOG), submentalis EMG (chin), nasal and oral airflow, thoracic and abdominal wall motion, anterior tibialis EMG, snore microphone, electrocardiogram, and pulse oximetry.  MEDICATIONS Medications self-administered by patient taken the night of the study : none reported  SLEEP ARCHITECTURE The study was initiated at 10:57:01 PM and ended at 5:00:31 AM.  Sleep onset time was 7.1 minutes and the sleep efficiency was 91.2%%. The total sleep time was 331.4 minutes.  Stage REM latency was 75.5 minutes.  The patient spent 7.1%% of the night in stage N1 sleep, 74.8%% in stage N2 sleep, 0.0%% in stage N3 and 18.1% in REM.  Alpha intrusion was absent.  Supine sleep was 0.00%.  RESPIRATORY PARAMETERS The overall apnea/hypopnea index (AHI) was 21.7 per hour. There were 50 total apneas, including 50 obstructive, 0 central and 0 mixed apneas. There were 70 hypopneas and 2 RERAs.  The AHI during Stage REM sleep was 59.0 per hour.  AHI while supine was N/A per hour.  The mean oxygen saturation was 89.7%. The minimum SpO2 during sleep was 72.0%.  loud snoring was noted during this study.  CARDIAC DATA The 2 lead EKG demonstrated sinus rhythm. The mean heart rate was 65.6 beats per minute. Other EKG findings  include: None.  LEG MOVEMENT DATA The total PLMS were 0 with a resulting PLMS index of 0.0. Associated arousal with leg movement index was 0.0 .  IMPRESSIONS - Moderate obstructive sleep apnea occurred during this study (AHI = 21.7/h). - Insufficient early events to meet protocol requirements for split CPAP titration. - No significant central sleep apnea occurred during this study (CAI = 0.0/h). - Moderate oxygen desaturation was noted during this study (Min O2 = 72.0%). Mean sat 89.7%. - The patient snored with loud snoring volume. - No cardiac abnormalities were noted during this study. - Clinically significant periodic limb movements did not occur during sleep. No significant associated arousals.  DIAGNOSIS - Obstructive Sleep Apnea (327.23 [G47.33 ICD-10]) - Nocturnal Hypoxemia (327.26 [G47.36 ICD-10])  RECOMMENDATIONS - Suggest CPAP autopap or CPAP titration sleep study. Other options would be based on clinical judgment. - Be careful with alcohol, sedatives and other CNS depressants that may worsen sleep apnea and disrupt normal sleep architecture. - Sleep hygiene should be reviewed to assess factors that may improve sleep quality. - Weight management and regular exercise should be initiated or continued if appropriate.  [Electronically signed] 04/03/2019 02:31 PM  Baird Lyons MD, [place], American Board of Sleep Medicine   NPI: [id number]                          [place] [place] [facility]  ELECTRONICALLY SIGNED ON:  04/03/2019, 2:27 PM [place] PH: [phone]   FX: [phone] [place]

## 2019-04-22 ENCOUNTER — Other Ambulatory Visit: Payer: Self-pay

## 2019-04-22 ENCOUNTER — Encounter: Payer: Self-pay | Admitting: Pulmonary Disease

## 2019-04-22 ENCOUNTER — Ambulatory Visit (INDEPENDENT_AMBULATORY_CARE_PROVIDER_SITE_OTHER): Payer: Medicare Other | Admitting: Pulmonary Disease

## 2019-04-22 VITALS — BP 138/64 | HR 64 | Temp 97.3°F | Ht 75.0 in | Wt 379.0 lb

## 2019-04-22 DIAGNOSIS — R0609 Other forms of dyspnea: Secondary | ICD-10-CM

## 2019-04-22 DIAGNOSIS — G4733 Obstructive sleep apnea (adult) (pediatric): Secondary | ICD-10-CM | POA: Diagnosis not present

## 2019-04-22 NOTE — Progress Notes (Signed)
Subjective:    Patient ID: [name], male    DOB: January 11, 1971, 48 y.o.   MRN: [id number]  HPI Patient with a previous history of obstructive sleep apnea He used CPAP in the past about 2014 Stopped using CPAP due to intolerance of the machine and interface  Recently had a repeat study showing moderate obstructive sleep apnea  History significant for snoring, witnessed apneas Usually goes to bed between 10 PM and 11 PM Takes him about 15 minutes to fall asleep Wakes up about twice during the night to use the restroom Wakes up finally about 7:30 AM to 8:30 AM His weight has been relatively stable over the last few years  Denies morning headaches Denies dryness of his mouth Denies memory issues Admits leg swelling  Recently quit smoking-half a pack a day  Was a Engineer, structural, 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  HENT: Negative for congestion, dental problem, ear pain, nosebleeds, postnasal drip, rhinorrhea, sinus pressure, sneezing, sore throat and trouble swallowing.   Eyes: Negative for redness and itching.  Respiratory: Negative for cough, chest tightness, shortness of breath and wheezing.   Cardiovascular: Negative for palpitations and leg swelling.  Gastrointestinal: Negative for nausea and vomiting.  Genitourinary: Negative for dysuria.  Musculoskeletal: Negative for joint swelling.  Skin: Negative for rash.  Allergic/Immunologic: Negative.  Negative for environmental allergies, food allergies and immunocompromised state.  Neurological: Negative for headaches.  Hematological: Does not bruise/bleed easily.  Psychiatric/Behavioral: Negative for dysphoric mood. The patient is nervous/anxious.    Past Medical History:  Diagnosis Date  . Ankle fracture, right   . Bell's palsy   . Bipolar affective disorder  ([place])   . Diabetes mellitus   . Gout   . Hyperlipidemia    Per pt - reported on 03/07/12  . Hypertension   . Obesity   . Sleep apnea    Social History   Socioeconomic History  . Marital status: Married    Spouse name: Not on file  . Number of children: Not on file  . Years of education: Not on file  . Highest education level: Not on file  Occupational History  . Occupation: disability  Social Needs  . Financial resource strain: Not on file  . Food insecurity    Worry: Not on file    Inability: Not on file  . Transportation needs    Medical: Not on file    Non-medical: Not on file  Tobacco Use  . Smoking status: Former Smoker    Packs/day: 0.50    Years: 25.00    Pack years: 12.50    Types: Cigarettes    Quit date: 04/18/2019    Years since quitting: 0.0  . Smokeless tobacco: Never Used  . Tobacco comment: stopped 04/18/19  Substance and Sexual Activity  . Alcohol use: No  . Drug use: No  . Sexual activity: Not Currently    Partners: Female  Lifestyle  . Physical activity    Days per week: Not on file    Minutes per session: Not on file  . Stress: Not on file  Relationships  . Social Herbalist on phone: Not on file    Gets together: Not on file    Attends religious service: Not on file    Active member of club or organization: Not  on file    Attends meetings of clubs or organizations: Not on file    Relationship status: Not on file  . Intimate partner violence    Fear of current or ex partner: Not on file    Emotionally abused: Not on file    Physically abused: Not on file    Forced sexual activity: Not on file  Other Topics Concern  . Not on file  Social History Narrative   Walking-- qd--1 mile   Family History  Problem Relation Age of Onset  . Hypertension Mother   . Diabetes Mother   . Hyperlipidemia Mother   . Pancreatitis Father   . Arthritis Father   . Heart attack Brother        33  . Heart disease Brother        MI  . Heart  attack Maternal Grandmother   . Cancer Maternal Grandmother        breast  . Kidney disease Sister        dialysis  . Lupus Sister   . Stroke Sister   . Diabetes Maternal Aunt   . Heart disease Maternal Aunt   . Diabetes Paternal Aunt   . Heart disease Paternal Aunt        pacemaker  . Heart disease Paternal Uncle        pacemaker  . Diabetes Paternal Grandmother   . Stroke Paternal Aunt   . COPD Maternal Aunt   . Heart disease Maternal Aunt   . Throat cancer Other        aunt  . Kidney failure Other        Objective:   Physical Exam Constitutional:      Appearance: Normal appearance.  HENT:     Head: Normocephalic and atraumatic.     Nose: Nose normal.     Mouth/Throat:     Comments: Mallampati 3 Crowded oropharynx Eyes:     General:        Right eye: No discharge.        Left eye: No discharge.     Extraocular Movements: Extraocular movements intact.     Pupils: Pupils are equal, round, and reactive to light.  Neck:     Musculoskeletal: Normal range of motion and neck supple.     Comments: Neck is thick Cardiovascular:     Rate and Rhythm: Normal rate and regular rhythm.  Pulmonary:     Effort: Pulmonary effort is normal. No respiratory distress.     Breath sounds: Normal breath sounds. No stridor. No wheezing or rhonchi.  Abdominal:     General: There is no distension.     Palpations: There is no mass.     Tenderness: There is no abdominal tenderness.  Musculoskeletal: Normal range of motion.        General: No tenderness.  Skin:    General: Skin is warm and dry.     Coloration: Skin is not jaundiced or pale.  Neurological:     General: No focal deficit present.     Mental Status: He is alert and oriented to person, place, and time.  Psychiatric:        Mood and Affect: Mood normal.        Behavior: Behavior normal.    Vitals:   04/22/19 0942  BP: 138/64  Pulse: 64  Temp: (!) 97.3 F (36.3 C)  SpO2: 91%   Results of the Epworth flowsheet  04/22/2019  Sitting and reading 2  Watching TV 2  Sitting, inactive in a public place (e.g. a theatre or a meeting) 1  As a passenger in a car for an hour without a break 1  Lying down to rest in the afternoon when circumstances permit 2  Sitting and talking to someone 0  Sitting quietly after a lunch without alcohol 1  In a car, while stopped for a few minutes in traffic 0  Total score 9   Polysomnogram reviewed showing moderate obstructive sleep apnea with moderate oxygen desaturations performed June 2020-interpreted by Dr. Baird Lyons.    Assessment & Plan:  .  Moderate obstructive sleep apnea  .  Morbid obesity  .  Diabetes  .  Hypertension  .  Dyspnea on exertion  .  Previous smoking history  Plan: .  Contact DME company for initiation of CPAP therapy   .  We will start on CPAP-auto titrating CPAP pressure of 5-20 with heated humidification  .  Pathophysiology of sleep disordered breathing discussed with the patient  .  Options of treatment discussed with the patient  .  Importance of weight control discussed with patient  .  Risk of not managing his sleep disordered breathing was discussed  .  We will obtain an echocardiogram to assess his dyspnea on exertion, multiple comorbidities for heart disease and he does have some leg swelling  .  We will see him back in the office in about 3 months  .  Encouraged to call with any significant concerns

## 2019-04-22 NOTE — Patient Instructions (Signed)
Moderate obstructive sleep apnea  We will set you up with a DME company to start you on CPAP therapy  Obtain echocardiogram to assess cardiac function-history of diabetes/hypertension/leg swelling  Continue regular physical activity for weight loss  I will see you back in the office in 3 months Call with significant concerns

## 2019-04-24 ENCOUNTER — Telehealth: Payer: Self-pay | Admitting: Pulmonary Disease

## 2019-04-24 NOTE — Telephone Encounter (Signed)
Called and spoke w/ pt. Pt states he has not heard from [facility] regarding his CPAP machine. I confirmed that order for new CPAP has been placed 04/22/2019 to [facility] and, according to referral notes, Huey [facility] has received the order. I gave him the number to [facility] [phone] to call and inquire if his CPAP is ready yet. Pt verbalized understanding with no additional questions. Nothing further needed at this time.

## 2019-04-26 ENCOUNTER — Ambulatory Visit (HOSPITAL_COMMUNITY): Payer: Medicare Other | Attending: Internal Medicine

## 2019-04-26 ENCOUNTER — Other Ambulatory Visit: Payer: Self-pay

## 2019-04-26 DIAGNOSIS — R0609 Other forms of dyspnea: Secondary | ICD-10-CM

## 2019-07-07 ENCOUNTER — Other Ambulatory Visit: Payer: Self-pay

## 2019-07-07 ENCOUNTER — Encounter (HOSPITAL_COMMUNITY): Payer: Self-pay | Admitting: *Deleted

## 2019-07-07 ENCOUNTER — Emergency Department (HOSPITAL_COMMUNITY): Payer: Medicare Other

## 2019-07-07 ENCOUNTER — Inpatient Hospital Stay (HOSPITAL_COMMUNITY)
Admission: EM | Admit: 2019-07-07 | Discharge: 2019-07-16 | DRG: 854 | Disposition: A | Discharge status: Home Health | Payer: Medicare Other | Attending: Family Medicine | Admitting: Family Medicine

## 2019-07-07 DIAGNOSIS — I10 Essential (primary) hypertension: Secondary | ICD-10-CM | POA: Diagnosis present

## 2019-07-07 DIAGNOSIS — J9859 Other diseases of mediastinum, not elsewhere classified: Secondary | ICD-10-CM

## 2019-07-07 DIAGNOSIS — R509 Fever, unspecified: Secondary | ICD-10-CM | POA: Diagnosis present

## 2019-07-07 DIAGNOSIS — M109 Gout, unspecified: Secondary | ICD-10-CM | POA: Diagnosis present

## 2019-07-07 DIAGNOSIS — E119 Type 2 diabetes mellitus without complications: Secondary | ICD-10-CM | POA: Diagnosis not present

## 2019-07-07 DIAGNOSIS — A419 Sepsis, unspecified organism: Secondary | ICD-10-CM | POA: Diagnosis present

## 2019-07-07 DIAGNOSIS — Z6841 Body Mass Index (BMI) 40.0 and over, adult: Secondary | ICD-10-CM | POA: Diagnosis not present

## 2019-07-07 DIAGNOSIS — N179 Acute kidney failure, unspecified: Secondary | ICD-10-CM | POA: Diagnosis present

## 2019-07-07 DIAGNOSIS — F319 Bipolar disorder, unspecified: Secondary | ICD-10-CM | POA: Diagnosis present

## 2019-07-07 DIAGNOSIS — E8770 Fluid overload, unspecified: Secondary | ICD-10-CM | POA: Diagnosis not present

## 2019-07-07 DIAGNOSIS — B957 Other staphylococcus as the cause of diseases classified elsewhere: Secondary | ICD-10-CM | POA: Diagnosis present

## 2019-07-07 DIAGNOSIS — Z87891 Personal history of nicotine dependence: Secondary | ICD-10-CM | POA: Diagnosis not present

## 2019-07-07 DIAGNOSIS — R6510 Systemic inflammatory response syndrome (SIRS) of non-infectious origin without acute organ dysfunction: Secondary | ICD-10-CM

## 2019-07-07 DIAGNOSIS — Z79899 Other long term (current) drug therapy: Secondary | ICD-10-CM | POA: Diagnosis not present

## 2019-07-07 DIAGNOSIS — R739 Hyperglycemia, unspecified: Secondary | ICD-10-CM | POA: Diagnosis present

## 2019-07-07 DIAGNOSIS — E871 Hypo-osmolality and hyponatremia: Secondary | ICD-10-CM | POA: Diagnosis present

## 2019-07-07 DIAGNOSIS — Z20828 Contact with and (suspected) exposure to other viral communicable diseases: Secondary | ICD-10-CM | POA: Diagnosis present

## 2019-07-07 DIAGNOSIS — Z7984 Long term (current) use of oral hypoglycemic drugs: Secondary | ICD-10-CM | POA: Diagnosis not present

## 2019-07-07 DIAGNOSIS — L02212 Cutaneous abscess of back [any part, except buttock]: Secondary | ICD-10-CM | POA: Diagnosis present

## 2019-07-07 DIAGNOSIS — G4733 Obstructive sleep apnea (adult) (pediatric): Secondary | ICD-10-CM | POA: Diagnosis present

## 2019-07-07 DIAGNOSIS — E1165 Type 2 diabetes mellitus with hyperglycemia: Secondary | ICD-10-CM | POA: Diagnosis present

## 2019-07-07 DIAGNOSIS — L03312 Cellulitis of back [any part except buttock]: Secondary | ICD-10-CM | POA: Diagnosis present

## 2019-07-07 DIAGNOSIS — F209 Schizophrenia, unspecified: Secondary | ICD-10-CM | POA: Diagnosis present

## 2019-07-07 DIAGNOSIS — Z833 Family history of diabetes mellitus: Secondary | ICD-10-CM

## 2019-07-07 DIAGNOSIS — E785 Hyperlipidemia, unspecified: Secondary | ICD-10-CM | POA: Diagnosis present

## 2019-07-07 DIAGNOSIS — IMO0002 Reserved for concepts with insufficient information to code with codable children: Secondary | ICD-10-CM | POA: Diagnosis present

## 2019-07-07 DIAGNOSIS — L0291 Cutaneous abscess, unspecified: Secondary | ICD-10-CM

## 2019-07-07 DIAGNOSIS — C37 Malignant neoplasm of thymus: Secondary | ICD-10-CM | POA: Diagnosis not present

## 2019-07-07 HISTORY — DX: Bipolar disorder, unspecified: F31.9

## 2019-07-07 HISTORY — DX: Obstructive sleep apnea (adult) (pediatric): G47.33

## 2019-07-07 LAB — LACTIC ACID, PLASMA: Lactic Acid, Venous: 1.4 mmol/L (ref 0.5–1.9)

## 2019-07-07 LAB — COMPREHENSIVE METABOLIC PANEL
ALT: 17 U/L (ref 0–44)
AST: 14 U/L — ABNORMAL LOW (ref 15–41)
Albumin: 3 g/dL — ABNORMAL LOW (ref 3.5–5.0)
Alkaline Phosphatase: 99 U/L (ref 38–126)
Anion gap: 9 (ref 5–15)
BUN: 13 mg/dL (ref 6–20)
CO2: 23 mmol/L (ref 22–32)
Calcium: 9 mg/dL (ref 8.9–10.3)
Chloride: 98 mmol/L (ref 98–111)
Creatinine, Ser: 1.22 mg/dL (ref 0.61–1.24)
GFR calc Af Amer: >60 mL/min (ref >60)
GFR calc non Af Amer: >60 mL/min (ref >60)
Glucose, Bld: 268 mg/dL — ABNORMAL HIGH (ref 70–99)
Potassium: 4.1 mmol/L (ref 3.5–5.1)
Sodium: 130 mmol/L — ABNORMAL LOW (ref 135–145)
Total Bilirubin: 1.4 mg/dL — ABNORMAL HIGH (ref 0.3–1.2)
Total Protein: 8.1 g/dL (ref 6.5–8.1)

## 2019-07-07 LAB — CBC WITH DIFFERENTIAL/PLATELET
Abs Immature Granulocytes: 0.09 10*3/uL — ABNORMAL HIGH (ref 0.00–0.07)
Basophils Absolute: 0.1 10*3/uL (ref 0.0–0.1)
Basophils Relative: 1 %
Eosinophils Absolute: 0.5 10*3/uL (ref 0.0–0.5)
Eosinophils Relative: 3 %
HCT: 41.5 % (ref 39.0–52.0)
Hemoglobin: 13.4 g/dL (ref 13.0–17.0)
Immature Granulocytes: 1 %
Lymphocytes Relative: 8 %
Lymphs Abs: 1.5 10*3/uL (ref 0.7–4.0)
MCH: 28.6 pg (ref 26.0–34.0)
MCHC: 32.3 g/dL (ref 30.0–36.0)
MCV: 88.5 fL (ref 80.0–100.0)
Monocytes Absolute: 1.3 10*3/uL — ABNORMAL HIGH (ref 0.1–1.0)
Monocytes Relative: 7 %
Neutro Abs: 13.9 10*3/uL — ABNORMAL HIGH (ref 1.7–7.7)
Neutrophils Relative %: 80 %
Platelets: 238 10*3/uL (ref 150–400)
RBC: 4.69 MIL/uL (ref 4.22–5.81)
RDW: 12.7 % (ref 11.5–15.5)
WBC: 17.3 10*3/uL — ABNORMAL HIGH (ref 4.0–10.5)
nRBC: 0 % (ref 0.0–0.2)

## 2019-07-07 LAB — SARS CORONAVIRUS 2 (TAT 6-24 HRS): SARS Coronavirus 2: NEGATIVE

## 2019-07-07 MED ORDER — LACTATED RINGERS IV BOLUS
2000.0000 mL | Freq: Once | INTRAVENOUS | Status: AC
  Administered 2019-07-07: 2000 mL via INTRAVENOUS

## 2019-07-07 MED ORDER — ACETAMINOPHEN 500 MG PO TABS
1000.0000 mg | ORAL_TABLET | Freq: Once | ORAL | Status: AC
  Administered 2019-07-07: 1000 mg via ORAL
  Filled 2019-07-07: qty 2

## 2019-07-07 MED ORDER — LACTATED RINGERS IV SOLN
INTRAVENOUS | Status: DC
  Administered 2019-07-07 – 2019-07-08 (×2): via INTRAVENOUS

## 2019-07-07 MED ORDER — PIPERACILLIN-TAZOBACTAM 3.375 G IVPB 30 MIN
3.3750 g | Freq: Once | INTRAVENOUS | Status: AC
  Administered 2019-07-07: 22:00:00 3.375 g via INTRAVENOUS
  Filled 2019-07-07: qty 50

## 2019-07-07 MED ORDER — PIPERACILLIN-TAZOBACTAM 3.375 G IVPB
3.3750 g | Freq: Three times a day (TID) | INTRAVENOUS | Status: DC
  Administered 2019-07-08: 3.375 g via INTRAVENOUS

## 2019-07-07 MED ORDER — VANCOMYCIN HCL 10 G IV SOLR
1750.0000 mg | Freq: Two times a day (BID) | INTRAVENOUS | Status: DC
  Filled 2019-07-07: qty 1750

## 2019-07-07 MED ORDER — SODIUM CHLORIDE 0.9% FLUSH
3.0000 mL | Freq: Once | INTRAVENOUS | Status: AC
  Administered 2019-07-07: 18:00:00 3 mL via INTRAVENOUS

## 2019-07-07 MED ORDER — MORPHINE SULFATE (PF) 4 MG/ML IV SOLN
4.0000 mg | Freq: Once | INTRAVENOUS | Status: AC
  Administered 2019-07-07: 4 mg via INTRAVENOUS
  Filled 2019-07-07: qty 1

## 2019-07-07 MED ORDER — VANCOMYCIN HCL 10 G IV SOLR
2500.0000 mg | Freq: Once | INTRAVENOUS | Status: AC
  Administered 2019-07-07: 2500 mg via INTRAVENOUS
  Filled 2019-07-07: qty 2500

## 2019-07-07 MED ORDER — CLINDAMYCIN PHOSPHATE 600 MG/50ML IV SOLN
600.0000 mg | Freq: Once | INTRAVENOUS | Status: AC
  Administered 2019-07-07: 600 mg via INTRAVENOUS
  Filled 2019-07-07: qty 50

## 2019-07-07 MED ORDER — SODIUM CHLORIDE 0.9 % IV BOLUS
500.0000 mL | Freq: Once | INTRAVENOUS | Status: AC
  Administered 2019-07-07: 500 mL via INTRAVENOUS

## 2019-07-07 MED ORDER — IOHEXOL 300 MG/ML  SOLN
75.0000 mL | Freq: Once | INTRAMUSCULAR | Status: AC | PRN
  Administered 2019-07-07: 75 mL via INTRAVENOUS

## 2019-07-07 NOTE — Progress Notes (Signed)
Pharmacy Antibiotic Note  [name] is a 48 y.o. male admitted on 07/07/2019 with drainage from back with swelling and redness.  Pharmacy has been consulted for vancomycin and Zosyn dosing for sepsis.  SCr 1.22, nCrCL 75 ml/min, Tmax 101, WBC 17.3, LA 1.4.  Plan: Vanc 2500mg  IV x 1, then 1750mg  IV Q12H for AUC 528 using SCr 1.22 Zosyn EID 3.375gm IV Q8H Monitor renal fxn, clinical progress, vanc AUC as indicated  Weight: (!) 379 lb (171.9 kg)  Temp (24hrs), Avg:101 F (38.3 C), Min:100.9 F (38.3 C), Max:101 F (38.3 C)  Recent Labs  Lab 07/07/19 1600  WBC 17.3*  CREATININE 1.22  LATICACIDVEN 1.4    Estimated Creatinine Clearance: 123.8 mL/min (by C-G formula based on SCr of 1.22 mg/dL).    Allergies  Allergen Reactions  . Cucumber Extract Shortness Of Breath    Vanc 10/11 >> Zosyn 10/11 >>  10/11 covid -   Pranathi Winfree D. Mina Marble, PharmD, BCPS, [place] 07/07/2019, 8:36 PM

## 2019-07-07 NOTE — ED Notes (Signed)
ED TO INPATIENT HANDOFF REPORT  ED Nurse Name and Phone #:  [phone]  S Name/Age/Gender [name] 48 y.o. male Room/Bed: 033C/033C  Code Status   Code Status: Not on file  Home/SNF/Other Home Patient oriented to: self, place, time and situation Is this baseline? Yes   Triage Complete: Triage complete  Chief Complaint knot in neck  Triage Note Pt reporting a know on the back of his left upper back, drained small amount. Pt says that he does use ice and it seems to help. Large area of swelling, redness. No fevers.    Allergies Allergies  Allergen Reactions  . Cucumber Extract Shortness Of Breath    Level of Care/Admitting Diagnosis ED Disposition    ED Disposition Condition Comment   Admit  Hospital Area: [facility] [[id number]]  Level of Care: Progressive [102]  Covid Evaluation: Asymptomatic Screening Protocol (No Symptoms)  [facility]: [id number]  Admitting Physician: Rise Patience [phone]  Attending Physician: Rise Patience [phone]  Estimated length of stay: past midnight tomorrow  Certification:: I certify this patient will need inpatient services for at least 2 midnights  PT Class (Do Not Modify): Inpatient [101]  PT Acc Code (Do Not Modify): Private [1]       B Medical/Surgery History Past Medical History:  Diagnosis Date  . Ankle fracture, right   . Bell's palsy   . Bipolar affective disorder ([place])   . Diabetes mellitus   . Gout   . Hyperlipidemia    Per pt - reported on 03/07/12  . Hypertension   . Obesity   . Sleep apnea    Past Surgical History:  Procedure Laterality Date  . KELOID EXCISION    . VASECTOMY       A IV Location/Drains/Wounds Patient Lines/Drains/Airways Status   Active Line/Drains/Airways    Name:   Placement date:   Placement time:   Site:   Days:   Peripheral IV 07/07/19 Right Forearm   07/07/19    1727    Forearm   less than 1          Intake/Output Last 24  hours  Intake/Output Summary (Last 24 hours) at 07/07/2019 2039 Last data filed at 07/07/2019 1928 Gross per 24 hour  Intake 550 ml  Output -  Net 550 ml    Labs/Imaging Results for orders placed or performed during the hospital encounter of 07/07/19 (from the past 48 hour(s))  Lactic acid, plasma     Status: None   Collection Time: 07/07/19  4:00 PM  Result Value Ref Range   Lactic Acid, Venous 1.4 0.5 - 1.9 mmol/L    Comment: Performed at [facility], [address], [place], [place] [address]  Comprehensive metabolic panel     Status: Abnormal   Collection Time: 07/07/19  4:00 PM  Result Value Ref Range   Sodium 130 (L) 135 - 145 mmol/L   Potassium 4.1 3.5 - 5.1 mmol/L   Chloride 98 98 - 111 mmol/L   CO2 23 22 - 32 mmol/L   Glucose, Bld 268 (H) 70 - 99 mg/dL   BUN 13 6 - 20 mg/dL   Creatinine, Ser 1.22 0.61 - 1.24 mg/dL   Calcium 9.0 8.9 - 10.3 mg/dL   Total Protein 8.1 6.5 - 8.1 g/dL   Albumin 3.0 (L) 3.5 - 5.0 g/dL   AST 14 (L) 15 - 41 U/L   ALT 17 0 - 44 U/L  Alkaline Phosphatase 99 38 - 126 U/L   Total Bilirubin 1.4 (H) 0.3 - 1.2 mg/dL   GFR calc non Af Amer >60 >60 mL/min   GFR calc Af Amer >60 >60 mL/min   Anion gap 9 5 - 15    Comment: Performed at [facility] [address], [place], [place] [address]  CBC with Differential     Status: Abnormal   Collection Time: 07/07/19  4:00 PM  Result Value Ref Range   WBC 17.3 (H) 4.0 - 10.5 K/uL   RBC 4.69 4.22 - 5.81 MIL/uL   Hemoglobin 13.4 13.0 - 17.0 g/dL   HCT 41.5 39.0 - 52.0 %   MCV 88.5 80.0 - 100.0 fL   MCH 28.6 26.0 - 34.0 pg   MCHC 32.3 30.0 - 36.0 g/dL   RDW 12.7 11.5 - 15.5 %   Platelets 238 150 - 400 K/uL   nRBC 0.0 0.0 - 0.2 %   Neutrophils Relative % 80 %   Neutro Abs 13.9 (H) 1.7 - 7.7 K/uL   Lymphocytes Relative 8 %   Lymphs Abs 1.5 0.7 - 4.0 K/uL   Monocytes Relative 7 %   Monocytes Absolute 1.3 (H) 0.1 - 1.0 K/uL   Eosinophils Relative 3 %   Eosinophils Absolute 0.5 0.0 -  0.5 K/uL   Basophils Relative 1 %   Basophils Absolute 0.1 0.0 - 0.1 K/uL   Immature Granulocytes 1 %   Abs Immature Granulocytes 0.09 (H) 0.00 - 0.07 K/uL    Comment: Performed at [facility] [address], [place], [place] [address]   Ct Chest W Contrast  Result Date: 07/07/2019 CLINICAL DATA:  Swelling redness of the left upper back. EXAM: CT CHEST WITH CONTRAST TECHNIQUE: Multidetector CT imaging of the chest was performed during intravenous contrast administration. CONTRAST:  52mL OMNIPAQUE IOHEXOL 300 MG/ML  SOLN COMPARISON:  None. FINDINGS: Cardiovascular: No significant vascular findings. Normal heart size. No pericardial effusion. Mediastinum/Nodes: No enlarged mediastinal, hilar, or axillary lymph nodes. Thyroid gland, trachea, and esophagus demonstrate no significant findings. Numerous ground-glass and soft tissue nodules within the anterior mediastinal fat, at the anatomic location of the thymus. Lungs/Pleura: Lungs are clear. No pleural effusion or pneumothorax. Upper Abdomen: No acute abnormality. Musculoskeletal: No fracture is seen. In correlation to the area of clinical concern, there is skin thickening and extensive subcutaneous fat stranding, to the level of the outer muscular fascia in the right upper back. No drainable fluid collection is seen. The skin thickening extends into the inferior back. Gynecomastia. IMPRESSION: 1. Numerous ground-glass and soft tissue nodules within the anterior mediastinal fat, at the anatomic location of the thymus. This may represent residual thymus or thymoma. Please correlate clinically. Close follow-up or tissue diagnosis may be considered. 2. In correlation to the area of clinical concern, there is skin thickening and extensive subcutaneous fat stranding, to the level of the outer muscular fascia in the right upper back. No drainable fluid collection is seen. The skin thickening extends into the inferior back. Electronically Signed   By:  Fidela Salisbury M.D.   On: 07/07/2019 19:25    Pending Labs Unresulted Labs (From admission, onward)    Start     Ordered   07/08/19 [id number]  Basic metabolic panel  Tomorrow morning,   R     07/07/19 2037   07/07/19 1724  SARS CORONAVIRUS 2 (TAT 6-24 HRS) Nasopharyngeal Nasopharyngeal Swab  (Asymptomatic/Tier 2 Patients Labs)  Once,  STAT    Question Answer Comment  Is this test for diagnosis or screening Screening   Symptomatic for COVID-19 as defined by CDC No   Hospitalized for COVID-19 No   Admitted to ICU for COVID-19 No   Previously tested for COVID-19 Unknown   Resident in a congregate (group) care setting No   Employed in healthcare setting No      07/07/19 1724          Vitals/Pain Today's Vitals   07/07/19 1902 07/07/19 1957 07/07/19 1958 07/07/19 2000  BP:  125/85    Pulse:  62    Resp:  14    Temp:      TempSrc:      SpO2:  96%    Weight:    (!) 171.9 kg  PainSc: 0-No pain 0-No pain 0-No pain     Isolation Precautions No active isolations  Medications Medications  lactated ringers infusion (has no administration in time range)  piperacillin-tazobactam (ZOSYN) IVPB 3.375 g (has no administration in time range)  vancomycin (VANCOCIN) 2,500 mg in sodium chloride 0.9 % 500 mL IVPB (has no administration in time range)  piperacillin-tazobactam (ZOSYN) IVPB 3.375 g (has no administration in time range)  vancomycin (VANCOCIN) 1,750 mg in sodium chloride 0.9 % 500 mL IVPB (has no administration in time range)  sodium chloride flush (NS) 0.9 % injection 3 mL (3 mLs Intravenous Given 07/07/19 1730)  acetaminophen (TYLENOL) tablet 1,000 mg (1,000 mg Oral Given 07/07/19 1723)  morphine 4 MG/ML injection 4 mg (4 mg Intravenous Given 07/07/19 1727)  clindamycin (CLEOCIN) IVPB 600 mg (0 mg Intravenous Stopped 07/07/19 1800)  sodium chloride 0.9 % bolus 500 mL (0 mLs Intravenous Stopped 07/07/19 1928)  iohexol (OMNIPAQUE) 300 MG/ML solution 75 mL (75 mLs Intravenous  Contrast Given 07/07/19 1851)  lactated ringers bolus 2,000 mL (2,000 mLs Intravenous New Bag/Given 07/07/19 2025)    Mobility walks Low fall risk   Focused Assessments    R Recommendations: See Admitting Provider Note  Report given to:   Additional Notes:

## 2019-07-07 NOTE — ED Triage Notes (Signed)
Pt reporting a know on the back of his left upper back, drained small amount. Pt says that he does use ice and it seems to help. Large area of swelling, redness. No fevers.

## 2019-07-07 NOTE — ED Provider Notes (Signed)
[facility] EMERGENCY DEPARTMENT Provider Note   CSN: [id number] Arrival date & time: 07/07/19  1537     History   Chief Complaint Chief Complaint  Patient presents with   Abscess    HPI [name] is a 48 y.o. male presenting for evaluation of back infection.  Patient states over the past week, he has been having gradually worsening pain in his right upper back.  Began this was a small bump, but since has grown very large.  Pain is present with movement of his right arm.  He denies numbness or tingling.  He has been using hot and cold packs without improvement of symptoms.  He reports a history of prediabetes for which he is on medication.  Additional history of hypertension and hyperlipidemia.  He denies chest pain, shortness of breath, nausea, vomiting abdominal pain, urinary symptoms, abnormal bowel movements.     HPI  Past Medical History:  Diagnosis Date   Ankle fracture, right    Bell's palsy    Bipolar affective disorder ([place])    Diabetes mellitus    Gout    Hyperlipidemia    Per pt - reported on 03/07/12   Hypertension    Obesity    Sleep apnea     Patient Active Problem List   Diagnosis Date Noted   Sepsis ([place]) 07/07/2019   Preventative health care 08/11/2015   HTN (hypertension) 12/20/2012   Recurrent boils 12/20/2012   OBSTRUCTIVE SLEEP APNEA 08/13/2010   ANA POSITIVE 04/01/2010   ERECTILE DYSFUNCTION, ORGANIC 03/30/2010   Idiopathic chronic gout of right ankle without tophus 06/16/2009   HYPERLIPIDEMIA 11/14/2008   MORBID OBESITY 10/17/2008   Diabetes mellitus type II, uncontrolled ([place]) 03/01/2007   PROTEINURIA 03/01/2007   SCHIZOPHRENIA 02/26/2007   Bipolar disorder, unspecified ([place]) 02/26/2007    Past Surgical History:  Procedure Laterality Date   KELOID EXCISION     VASECTOMY          Home Medications    Prior to Admission medications   Medication Sig Start Date End Date Taking?  Authorizing Provider  glipiZIDE (GLUCOTROL) 5 MG tablet Take 5 mg by mouth daily before breakfast.   Yes [provider]  irbesartan (AVAPRO) 300 MG tablet Take 300 mg by mouth daily.   Yes [provider]  rosuvastatin (CRESTOR) 10 MG tablet Take 10 mg by mouth daily.   Yes [provider]  tadalafil (CIALIS) 5 MG tablet Take 5 mg by mouth daily.   Yes [provider]  tamsulosin (FLOMAX) 0.4 MG CAPS capsule Take 0.4 mg by mouth at bedtime.   Yes [provider]    Family History Family History  Problem Relation Age of Onset   Hypertension Mother    Diabetes Mother    Hyperlipidemia Mother    Pancreatitis Father    Arthritis Father    Heart attack Brother        58   Heart disease Brother        MI   Heart attack Maternal Grandmother    Cancer Maternal Grandmother        breast   Kidney disease Sister        dialysis   Lupus Sister    Stroke Sister    Diabetes Maternal Aunt    Heart disease Maternal Aunt    Diabetes Paternal Aunt    Heart disease Paternal Aunt        pacemaker   Heart disease Paternal Uncle  pacemaker   Diabetes Paternal Grandmother    Stroke Paternal Aunt    COPD Maternal Aunt    Heart disease Maternal Aunt    Throat cancer Other        aunt   Kidney failure Other     Social History Social History   Tobacco Use   Smoking status: Former Smoker    Packs/day: 0.50    Years: 25.00    Pack years: 12.50    Types: Cigarettes    Quit date: 04/18/2019    Years since quitting: 0.2   Smokeless tobacco: Never Used   Tobacco comment: stopped 04/18/19  Substance Use Topics   Alcohol use: No   Drug use: No     Allergies   Cucumber extract   Review of Systems Review of Systems  Constitutional: Positive for fever.  Musculoskeletal: Positive for back pain.  Skin: Positive for color change.  All other systems reviewed and are negative.    Physical Exam Updated Vital  Signs BP 125/85    Pulse 62    Temp (!) 101 F (38.3 C) (Oral)    Resp 14    Wt (!) 171.9 kg    SpO2 96%    BMI 47.37 kg/m   Physical Exam Vitals signs and nursing note reviewed.  Constitutional:      General: He is not in acute distress.    Appearance: He is well-developed.     Comments: Appears uncomfortable due to pain  HENT:     Head: Normocephalic and atraumatic.  Eyes:     Extraocular Movements: Extraocular movements intact.     Conjunctiva/sclera: Conjunctivae normal.     Pupils: Pupils are equal, round, and reactive to light.  Neck:     Musculoskeletal: Normal range of motion and neck supple.  Cardiovascular:     Rate and Rhythm: Regular rhythm. Tachycardia present.     Pulses: Normal pulses.     Comments: Tachycardic on my exam around 110 Pulmonary:     Effort: Pulmonary effort is normal. No respiratory distress.     Breath sounds: Normal breath sounds. No wheezing.  Abdominal:     General: There is no distension.     Palpations: Abdomen is soft. There is no mass.     Tenderness: There is no abdominal tenderness. There is no guarding or rebound.  Musculoskeletal: Normal range of motion.       Back:     Comments: Large area of erythema and tenderness and induration.  No fluctuance or definable borders.  No active drainage. Full active range of motion of the neck without pain or signs of meningismus.  Grip strength of upper extremities intact bilaterally.  Distal sensation intact bilaterally.  Skin:    General: Skin is warm and dry.  Neurological:     Mental Status: He is alert and oriented to person, place, and time.      ED Treatments / Results  Labs (all labs ordered are listed, but only abnormal results are displayed) Labs Reviewed  COMPREHENSIVE METABOLIC PANEL - Abnormal; Notable for the following components:      Result Value   Sodium 130 (*)    Glucose, Bld 268 (*)    Albumin 3.0 (*)    AST 14 (*)    Total Bilirubin 1.4 (*)    All other components  within normal limits  CBC WITH DIFFERENTIAL/PLATELET - Abnormal; Notable for the following components:   WBC 17.3 (*)    Neutro Abs 13.9 (*)  Monocytes Absolute 1.3 (*)    Abs Immature Granulocytes 0.09 (*)    All other components within normal limits  SARS CORONAVIRUS 2 (TAT 6-24 HRS)  LACTIC ACID, PLASMA  BASIC METABOLIC PANEL    EKG None  Radiology Ct Chest W Contrast  Result Date: 07/07/2019 CLINICAL DATA:  Swelling redness of the left upper back. EXAM: CT CHEST WITH CONTRAST TECHNIQUE: Multidetector CT imaging of the chest was performed during intravenous contrast administration. CONTRAST:  15mL OMNIPAQUE IOHEXOL 300 MG/ML  SOLN COMPARISON:  None. FINDINGS: Cardiovascular: No significant vascular findings. Normal heart size. No pericardial effusion. Mediastinum/Nodes: No enlarged mediastinal, hilar, or axillary lymph nodes. Thyroid gland, trachea, and esophagus demonstrate no significant findings. Numerous ground-glass and soft tissue nodules within the anterior mediastinal fat, at the anatomic location of the thymus. Lungs/Pleura: Lungs are clear. No pleural effusion or pneumothorax. Upper Abdomen: No acute abnormality. Musculoskeletal: No fracture is seen. In correlation to the area of clinical concern, there is skin thickening and extensive subcutaneous fat stranding, to the level of the outer muscular fascia in the right upper back. No drainable fluid collection is seen. The skin thickening extends into the inferior back. Gynecomastia. IMPRESSION: 1. Numerous ground-glass and soft tissue nodules within the anterior mediastinal fat, at the anatomic location of the thymus. This may represent residual thymus or thymoma. Please correlate clinically. Close follow-up or tissue diagnosis may be considered. 2. In correlation to the area of clinical concern, there is skin thickening and extensive subcutaneous fat stranding, to the level of the outer muscular fascia in the right upper back. No  drainable fluid collection is seen. The skin thickening extends into the inferior back. Electronically Signed   By: Fidela Salisbury M.D.   On: 07/07/2019 19:25    Procedures Procedures (including critical care time)  Medications Ordered in ED Medications  lactated ringers infusion (has no administration in time range)  piperacillin-tazobactam (ZOSYN) IVPB 3.375 g (has no administration in time range)  vancomycin (VANCOCIN) 2,500 mg in sodium chloride 0.9 % 500 mL IVPB (has no administration in time range)  piperacillin-tazobactam (ZOSYN) IVPB 3.375 g (has no administration in time range)  vancomycin (VANCOCIN) 1,750 mg in sodium chloride 0.9 % 500 mL IVPB (has no administration in time range)  sodium chloride flush (NS) 0.9 % injection 3 mL (3 mLs Intravenous Given 07/07/19 1730)  acetaminophen (TYLENOL) tablet 1,000 mg (1,000 mg Oral Given 07/07/19 1723)  morphine 4 MG/ML injection 4 mg (4 mg Intravenous Given 07/07/19 1727)  clindamycin (CLEOCIN) IVPB 600 mg (0 mg Intravenous Stopped 07/07/19 1800)  sodium chloride 0.9 % bolus 500 mL (0 mLs Intravenous Stopped 07/07/19 1928)  iohexol (OMNIPAQUE) 300 MG/ML solution 75 mL (75 mLs Intravenous Contrast Given 07/07/19 1851)  lactated ringers bolus 2,000 mL (2,000 mLs Intravenous New Bag/Given 07/07/19 2025)     Initial Impression / Assessment and Plan / ED Course  I have reviewed the triage vital signs and the nursing notes.  Pertinent labs & imaging results that were available during my care of the patient were reviewed by me and considered in my medical decision making (see chart for details).        Presenting for evaluation of back infection.  Physical exam shows patient who appears uncomfortable due to pain.  He is febrile and tachycardic with a large area of infection on his back.  Meets sirs criteria.  Concern for cellulitis versus abscess.  Will obtain CT for further evaluation.  Fluids, antibiotics, and morphine given  for  symptom control.  Tylenol for fever.  Labs show white count of 17.  Lactic normal.  Slight hyperglycemia at 268, otherwise labs are reassuring.  CT shows skin thickening with extensive subcu stranding of the right upper back.  No obvious abscess.  Case discussed with attending, Dr. Alvino Chapel evaluated the patient.  On reassessment, patient appears improved.  Pain is improved and heart rate is improved.  However, considering his history of diabetes and his extensive cellulitis per CT, will admit to medicine.  Discussed with Dr. Hal Hope from [facility], patient to be admitted   Final Clinical Impressions(s) / ED Diagnoses   Final diagnoses:  Cellulitis of back  SIRS (systemic inflammatory response syndrome) [facility])    ED Discharge Orders    None       Franchot Heidelberg, PA-C 07/07/19 2053    Davonna Belling, MD 07/07/19 2311

## 2019-07-08 ENCOUNTER — Encounter (HOSPITAL_COMMUNITY): Payer: Self-pay | Admitting: Internal Medicine

## 2019-07-08 ENCOUNTER — Inpatient Hospital Stay (HOSPITAL_COMMUNITY): Payer: Medicare Other

## 2019-07-08 DIAGNOSIS — A419 Sepsis, unspecified organism: Principal | ICD-10-CM

## 2019-07-08 DIAGNOSIS — C37 Malignant neoplasm of thymus: Secondary | ICD-10-CM

## 2019-07-08 DIAGNOSIS — L03312 Cellulitis of back [any part except buttock]: Secondary | ICD-10-CM

## 2019-07-08 DIAGNOSIS — I10 Essential (primary) hypertension: Secondary | ICD-10-CM

## 2019-07-08 DIAGNOSIS — R739 Hyperglycemia, unspecified: Secondary | ICD-10-CM | POA: Diagnosis present

## 2019-07-08 LAB — CBC WITH DIFFERENTIAL/PLATELET
Abs Immature Granulocytes: 0.08 10*3/uL — ABNORMAL HIGH (ref 0.00–0.07)
Basophils Absolute: 0.1 10*3/uL (ref 0.0–0.1)
Basophils Relative: 0 %
Eosinophils Absolute: 0.5 10*3/uL (ref 0.0–0.5)
Eosinophils Relative: 3 %
HCT: 38 % — ABNORMAL LOW (ref 39.0–52.0)
Hemoglobin: 12.5 g/dL — ABNORMAL LOW (ref 13.0–17.0)
Immature Granulocytes: 1 %
Lymphocytes Relative: 6 %
Lymphs Abs: 0.9 10*3/uL (ref 0.7–4.0)
MCH: 28.7 pg (ref 26.0–34.0)
MCHC: 32.9 g/dL (ref 30.0–36.0)
MCV: 87.2 fL (ref 80.0–100.0)
Monocytes Absolute: 1.3 10*3/uL — ABNORMAL HIGH (ref 0.1–1.0)
Monocytes Relative: 8 %
Neutro Abs: 13.4 10*3/uL — ABNORMAL HIGH (ref 1.7–7.7)
Neutrophils Relative %: 82 %
Platelets: 212 10*3/uL (ref 150–400)
RBC: 4.36 MIL/uL (ref 4.22–5.81)
RDW: 12.7 % (ref 11.5–15.5)
WBC: 16.2 10*3/uL — ABNORMAL HIGH (ref 4.0–10.5)
nRBC: 0 % (ref 0.0–0.2)

## 2019-07-08 LAB — COMPREHENSIVE METABOLIC PANEL
ALT: 18 U/L (ref 0–44)
AST: 13 U/L — ABNORMAL LOW (ref 15–41)
Albumin: 2.7 g/dL — ABNORMAL LOW (ref 3.5–5.0)
Alkaline Phosphatase: 92 U/L (ref 38–126)
Anion gap: 11 (ref 5–15)
BUN: 12 mg/dL (ref 6–20)
CO2: 21 mmol/L — ABNORMAL LOW (ref 22–32)
Calcium: 8.7 mg/dL — ABNORMAL LOW (ref 8.9–10.3)
Chloride: 100 mmol/L (ref 98–111)
Creatinine, Ser: 1.14 mg/dL (ref 0.61–1.24)
GFR calc Af Amer: >60 mL/min (ref >60)
GFR calc non Af Amer: >60 mL/min (ref >60)
Glucose, Bld: 292 mg/dL — ABNORMAL HIGH (ref 70–99)
Potassium: 4 mmol/L (ref 3.5–5.1)
Sodium: 132 mmol/L — ABNORMAL LOW (ref 135–145)
Total Bilirubin: 1.1 mg/dL (ref 0.3–1.2)
Total Protein: 7.3 g/dL (ref 6.5–8.1)

## 2019-07-08 LAB — GLUCOSE, CAPILLARY
Glucose-Capillary: 220 mg/dL — ABNORMAL HIGH (ref 70–99)
Glucose-Capillary: 232 mg/dL — ABNORMAL HIGH (ref 70–99)
Glucose-Capillary: 293 mg/dL — ABNORMAL HIGH (ref 70–99)
Glucose-Capillary: 293 mg/dL — ABNORMAL HIGH (ref 70–99)
Glucose-Capillary: 317 mg/dL — ABNORMAL HIGH (ref 70–99)

## 2019-07-08 LAB — HCG, SERUM, QUALITATIVE: Preg, Serum: NEGATIVE

## 2019-07-08 LAB — PROCALCITONIN: Procalcitonin: 0.14 ng/mL

## 2019-07-08 LAB — CK: Total CK: 210 U/L (ref 49–397)

## 2019-07-08 LAB — LACTATE DEHYDROGENASE: LDH: 121 U/L (ref 98–192)

## 2019-07-08 LAB — HEMOGLOBIN A1C
Hgb A1c MFr Bld: 9.5 % — ABNORMAL HIGH (ref 4.8–5.6)
Mean Plasma Glucose: 225.95 mg/dL

## 2019-07-08 LAB — HIV ANTIBODY (ROUTINE TESTING W REFLEX): HIV Screen 4th Generation wRfx: NONREACTIVE

## 2019-07-08 MED ORDER — TAMSULOSIN HCL 0.4 MG PO CAPS
0.4000 mg | ORAL_CAPSULE | Freq: Every day | ORAL | Status: DC
  Administered 2019-07-08 – 2019-07-15 (×9): 0.4 mg via ORAL
  Filled 2019-07-08 (×9): qty 1

## 2019-07-08 MED ORDER — OXYCODONE HCL 5 MG PO TABS
5.0000 mg | ORAL_TABLET | Freq: Four times a day (QID) | ORAL | Status: DC | PRN
  Administered 2019-07-08 – 2019-07-16 (×12): 5 mg via ORAL
  Filled 2019-07-08 (×12): qty 1

## 2019-07-08 MED ORDER — ONDANSETRON HCL 4 MG/2ML IJ SOLN
4.0000 mg | Freq: Four times a day (QID) | INTRAMUSCULAR | Status: DC | PRN
  Administered 2019-07-16: 07:00:00 4 mg via INTRAVENOUS
  Filled 2019-07-08: qty 2

## 2019-07-08 MED ORDER — INSULIN ASPART 100 UNIT/ML ~~LOC~~ SOLN
0.0000 [IU] | Freq: Three times a day (TID) | SUBCUTANEOUS | Status: DC
  Administered 2019-07-08 – 2019-07-09 (×2): 7 [IU] via SUBCUTANEOUS
  Administered 2019-07-09: 13:00:00 4 [IU] via SUBCUTANEOUS
  Administered 2019-07-09: 7 [IU] via SUBCUTANEOUS
  Administered 2019-07-10: 4 [IU] via SUBCUTANEOUS
  Administered 2019-07-10: 7 [IU] via SUBCUTANEOUS
  Administered 2019-07-11: 20 [IU] via SUBCUTANEOUS
  Administered 2019-07-11: 11 [IU] via SUBCUTANEOUS
  Administered 2019-07-11 – 2019-07-12 (×3): 7 [IU] via SUBCUTANEOUS
  Administered 2019-07-12: 4 [IU] via SUBCUTANEOUS
  Administered 2019-07-13: 3 [IU] via SUBCUTANEOUS
  Administered 2019-07-13 (×2): 4 [IU] via SUBCUTANEOUS
  Administered 2019-07-14: 3 [IU] via SUBCUTANEOUS
  Administered 2019-07-14: 4 [IU] via SUBCUTANEOUS
  Administered 2019-07-15 – 2019-07-16 (×4): 3 [IU] via SUBCUTANEOUS

## 2019-07-08 MED ORDER — INSULIN GLARGINE 100 UNIT/ML ~~LOC~~ SOLN
20.0000 [IU] | Freq: Every day | SUBCUTANEOUS | Status: DC
  Administered 2019-07-08: 20 [IU] via SUBCUTANEOUS
  Filled 2019-07-08 (×2): qty 0.2

## 2019-07-08 MED ORDER — IRBESARTAN 300 MG PO TABS
300.0000 mg | ORAL_TABLET | Freq: Every day | ORAL | Status: DC
  Administered 2019-07-08: 300 mg via ORAL
  Filled 2019-07-08: qty 1

## 2019-07-08 MED ORDER — INSULIN ASPART 100 UNIT/ML ~~LOC~~ SOLN: 0.0000 [IU] | SUBCUTANEOUS | Status: DC

## 2019-07-08 MED ORDER — CLINDAMYCIN PHOSPHATE 600 MG/50ML IV SOLN
600.0000 mg | Freq: Three times a day (TID) | INTRAVENOUS | Status: DC
  Administered 2019-07-08 – 2019-07-09 (×3): 600 mg via INTRAVENOUS
  Filled 2019-07-08 (×4): qty 50

## 2019-07-08 MED ORDER — ROSUVASTATIN CALCIUM 5 MG PO TABS
10.0000 mg | ORAL_TABLET | Freq: Every day | ORAL | Status: DC
  Administered 2019-07-08 – 2019-07-15 (×8): 10 mg via ORAL
  Filled 2019-07-08 (×9): qty 2

## 2019-07-08 MED ORDER — ACETAMINOPHEN 325 MG PO TABS
650.0000 mg | ORAL_TABLET | Freq: Four times a day (QID) | ORAL | Status: DC | PRN
  Administered 2019-07-08 – 2019-07-14 (×9): 650 mg via ORAL
  Filled 2019-07-08 (×9): qty 2

## 2019-07-08 MED ORDER — ACETAMINOPHEN 650 MG RE SUPP: 650.0000 mg | Freq: Four times a day (QID) | RECTAL | Status: DC | PRN

## 2019-07-08 MED ORDER — ENOXAPARIN SODIUM 100 MG/ML ~~LOC~~ SOLN
89.0000 mg | SUBCUTANEOUS | Status: DC
  Administered 2019-07-08 – 2019-07-10 (×3): 90 mg via SUBCUTANEOUS
  Filled 2019-07-08 (×3): qty 1

## 2019-07-08 MED ORDER — INSULIN ASPART 100 UNIT/ML ~~LOC~~ SOLN
0.0000 [IU] | Freq: Every day | SUBCUTANEOUS | Status: DC
  Administered 2019-07-08: 2 [IU] via SUBCUTANEOUS
  Administered 2019-07-09 – 2019-07-11 (×2): 3 [IU] via SUBCUTANEOUS

## 2019-07-08 MED ORDER — IBUPROFEN 400 MG PO TABS
400.0000 mg | ORAL_TABLET | Freq: Four times a day (QID) | ORAL | Status: DC | PRN
  Administered 2019-07-08 – 2019-07-09 (×2): 400 mg via ORAL
  Filled 2019-07-08 (×2): qty 1

## 2019-07-08 MED ORDER — ONDANSETRON HCL 4 MG PO TABS: 4.0000 mg | ORAL_TABLET | Freq: Four times a day (QID) | ORAL | Status: DC | PRN

## 2019-07-08 NOTE — H&P (Signed)
History and Physical    [name] [id number] DOB: 1971-08-11 DOA: 07/07/2019  PCP: Rogers Blocker, MD  Patient coming from: Home.  Chief Complaint: Upper back pain.  Fever chills.  HPI: [name] is a 48 y.o. male with history of hypertension, hyperlipidemia, sleep apnea, prediabetes presents to the ER with complaints of pain and fever chills over the last 1 week.  Patient stated started in the mid upper back which has gradually worsened towards her right upper back.  Has no restriction of his hand movement or neck movement.  Noticed a small dimple-like in the middle of the upper back but no discharge was coming per denies any trauma or insect bite.  Given the ongoing symptoms patient presented to the ER.  ED Course: In the ER patient was febrile with temperature of 101 F tachycardic with labs showing leukocytosis WBC of 17.3 lactate was 1.4 glucose was 268 sodium 130 anion gap 9.  CT chest was done which shows possible thymoma and also extensive involvement of the upper back concerning for cellulitis.  No drainable abscess.  Review of Systems: As per HPI, rest all negative.   Past Medical History:  Diagnosis Date  . Ankle fracture, right   . Bell's palsy   . Bipolar affective disorder ([place])   . Diabetes mellitus   . Gout   . Hyperlipidemia    Per pt - reported on 03/07/12  . Hypertension   . Obesity   . Sleep apnea     Past Surgical History:  Procedure Laterality Date  . KELOID EXCISION    . VASECTOMY       reports that he quit smoking about 2 months ago. His smoking use included cigarettes. He has a 12.50 pack-year smoking history. He has never used smokeless tobacco. He reports that he does not drink alcohol or use drugs.  Allergies  Allergen Reactions  . Cucumber Extract Shortness Of Breath    Family History  Problem Relation Age of Onset  . Hypertension Mother   . Diabetes Mother   . Hyperlipidemia Mother   . Pancreatitis Father   . Arthritis  Father   . Heart attack Brother        66  . Heart disease Brother        MI  . Heart attack Maternal Grandmother   . Cancer Maternal Grandmother        breast  . Kidney disease Sister        dialysis  . Lupus Sister   . Stroke Sister   . Diabetes Maternal Aunt   . Heart disease Maternal Aunt   . Diabetes Paternal Aunt   . Heart disease Paternal Aunt        pacemaker  . Heart disease Paternal Uncle        pacemaker  . Diabetes Paternal Grandmother   . Stroke Paternal Aunt   . COPD Maternal Aunt   . Heart disease Maternal Aunt   . Throat cancer Other        aunt  . Kidney failure Other     Prior to Admission medications   Medication Sig Start Date End Date Taking? Authorizing Provider  glipiZIDE (GLUCOTROL) 5 MG tablet Take 5 mg by mouth daily before breakfast.   Yes [provider]  irbesartan (AVAPRO) 300 MG tablet Take 300 mg by mouth daily.   Yes [provider]  rosuvastatin (CRESTOR) 10 MG tablet Take 10 mg by mouth daily.  Yes [provider]  tadalafil (CIALIS) 5 MG tablet Take 5 mg by mouth daily.   Yes [provider]  tamsulosin (FLOMAX) 0.4 MG CAPS capsule Take 0.4 mg by mouth at bedtime.   Yes [provider]    Physical Exam: Constitutional: Moderately built and nourished. Vitals:   07/07/19 2000 07/07/19 2052 07/07/19 2200 07/07/19 2256  BP:   130/80 (!) 151/83  Pulse:    76  Resp:    17  Temp:  98.4 F (36.9 C)    TempSrc:  Oral    SpO2:    95%  Weight: (!) 171.9 kg      Eyes: Anicteric no pallor. ENMT: No discharge from the ears eyes nose or mouth. Neck: No mass felt.  No neck rigidity. Respiratory: No rhonchi or crepitations. Cardiovascular: S1-S2 heard. Abdomen: Soft nontender bowel sounds present. Musculoskeletal: Pain and tenderness on the right upper back.  Extending up to his axilla.  No restriction of movement of his right upper extremity or neck. Skin: Mild erythema of the right upper back.   Small ulceration seen in mid back. Neurologic: Alert awake oriented to time place and person.  Moves all extremities. Psychiatric: Appears normal per normal affect.   Labs on Admission: I have personally reviewed following labs and imaging studies  CBC: Recent Labs  Lab 07/07/19 1600  WBC 17.3*  NEUTROABS 13.9*  HGB 13.4  HCT 41.5  MCV 88.5  PLT 99991111   Basic Metabolic Panel: Recent Labs  Lab 07/07/19 1600  NA 130*  K 4.1  CL 98  CO2 23  GLUCOSE 268*  BUN 13  CREATININE 1.22  CALCIUM 9.0   GFR: Estimated Creatinine Clearance: 123.8 mL/min (by C-G formula based on SCr of 1.22 mg/dL). Liver Function Tests: Recent Labs  Lab 07/07/19 1600  AST 14*  ALT 17  ALKPHOS 99  BILITOT 1.4*  PROT 8.1  ALBUMIN 3.0*   No results for input(s): LIPASE, AMYLASE in the last 168 hours. No results for input(s): AMMONIA in the last 168 hours. Coagulation Profile: No results for input(s): INR, PROTIME in the last 168 hours. Cardiac Enzymes: No results for input(s): CKTOTAL, CKMB, CKMBINDEX, TROPONINI in the last 168 hours. BNP (last 3 results) No results for input(s): PROBNP in the last 8760 hours. HbA1C: No results for input(s): HGBA1C in the last 72 hours. CBG: No results for input(s): GLUCAP in the last 168 hours. Lipid Profile: No results for input(s): CHOL, HDL, LDLCALC, TRIG, CHOLHDL, LDLDIRECT in the last 72 hours. Thyroid Function Tests: No results for input(s): TSH, T4TOTAL, FREET4, T3FREE, THYROIDAB in the last 72 hours. Anemia Panel: No results for input(s): VITAMINB12, FOLATE, FERRITIN, TIBC, IRON, RETICCTPCT in the last 72 hours. Urine analysis:    Component Value Date/Time   COLORURINE YELLOW 03/22/2014 0401   APPEARANCEUR CLEAR 03/22/2014 0401   LABSPEC 1.025 03/22/2014 0401   PHURINE 5.5 03/22/2014 0401   GLUCOSEU NEGATIVE 03/22/2014 0401   HGBUR NEGATIVE 03/22/2014 0401   HGBUR small 10/17/2008 0000   BILIRUBINUR neg 08/11/2015 1652   KETONESUR NEGATIVE  03/22/2014 0401   PROTEINUR 3+ 08/11/2015 1652   PROTEINUR >300 (A) 03/22/2014 0401   UROBILINOGEN 0.2 08/11/2015 1652   UROBILINOGEN 0.2 03/22/2014 0401   NITRITE neg 08/11/2015 1652   NITRITE NEGATIVE 03/22/2014 0401   LEUKOCYTESUR Negative 08/11/2015 1652   Sepsis Labs: @LABRCNTIP (procalcitonin:4,lacticidven:4) ) Recent Results (from the past 240 hour(s))  SARS CORONAVIRUS 2 (TAT 6-24 HRS) Nasopharyngeal Nasopharyngeal Swab  Status: None   Collection Time: 07/07/19  6:05 PM   Specimen: Nasopharyngeal Swab  Result Value Ref Range Status   SARS Coronavirus 2 NEGATIVE NEGATIVE Final    Comment: (NOTE) SARS-CoV-2 target nucleic acids are NOT DETECTED. The SARS-CoV-2 RNA is generally detectable in upper and lower respiratory specimens during the acute phase of infection. Negative results do not preclude SARS-CoV-2 infection, do not rule out co-infections with other pathogens, and should not be used as the sole basis for treatment or other patient management decisions. Negative results must be combined with clinical observations, patient history, and epidemiological information. The expected result is Negative. Fact Sheet for Patients: SugarRoll.be Fact Sheet for Healthcare Providers: https://www.woods-mathews.com/ This test is not yet approved or cleared by the [place] FDA and  has been authorized for detection and/or diagnosis of SARS-CoV-2 by FDA under an Emergency Use Authorization (EUA). This EUA will remain  in effect (meaning this test can be used) for the duration of the COVID-19 declaration under Section 56 4(b)(1) of the Act, 21 U.S.C. section 360bbb-3(b)(1), unless the authorization is terminated or revoked sooner. Performed at [facility], [place] [address], [place], [place] [address]      Radiological Exams on Admission: Ct Chest W Contrast  Result Date: 07/07/2019 CLINICAL DATA:  Swelling redness of the  left upper back. EXAM: CT CHEST WITH CONTRAST TECHNIQUE: Multidetector CT imaging of the chest was performed during intravenous contrast administration. CONTRAST:  14mL OMNIPAQUE IOHEXOL 300 MG/ML  SOLN COMPARISON:  None. FINDINGS: Cardiovascular: No significant vascular findings. Normal heart size. No pericardial effusion. Mediastinum/Nodes: No enlarged mediastinal, hilar, or axillary lymph nodes. Thyroid gland, trachea, and esophagus demonstrate no significant findings. Numerous ground-glass and soft tissue nodules within the anterior mediastinal fat, at the anatomic location of the thymus. Lungs/Pleura: Lungs are clear. No pleural effusion or pneumothorax. Upper Abdomen: No acute abnormality. Musculoskeletal: No fracture is seen. In correlation to the area of clinical concern, there is skin thickening and extensive subcutaneous fat stranding, to the level of the outer muscular fascia in the right upper back. No drainable fluid collection is seen. The skin thickening extends into the inferior back. Gynecomastia. IMPRESSION: 1. Numerous ground-glass and soft tissue nodules within the anterior mediastinal fat, at the anatomic location of the thymus. This may represent residual thymus or thymoma. Please correlate clinically. Close follow-up or tissue diagnosis may be considered. 2. In correlation to the area of clinical concern, there is skin thickening and extensive subcutaneous fat stranding, to the level of the outer muscular fascia in the right upper back. No drainable fluid collection is seen. The skin thickening extends into the inferior back. Electronically Signed   By: Fidela Salisbury M.D.   On: 07/07/2019 19:25     Assessment/Plan Principal Problem:   Sepsis ([place]) Active Problems:   OBSTRUCTIVE SLEEP APNEA   HTN (hypertension)   Hyperglycemia    1. Sepsis from right upper back cellulitis -patient was tachycardic and febrile on presentation with leukocytosis physiology concerning for sepsis.   Blood cultures ordered.  Started on empiric antibiotics.  Closely monitor for any development of necrotizing fasciitis.  The CAT scan at this time does not show any definite signs of it. 2. Possible thymoma will need follow-up. 3. Hyperglycemia with history of prediabetes likely patient has diabetes mellitus.  Will check hemoglobin A1c keep patient on sliding scale coverage.  Follow metabolic panel and CBGs. 4. Hypertension on ARB. 5. History of sleep apnea will order CPAP once COVID-19  is negative. 6. Lipidemia on statins.  Check CK levels.  Given that patient has extensive cellulitis with possible sepsis will need more than 2 midnight stay and inpatient status.   DVT prophylaxis: SCDs in anticipation of procedure. Code Status: Full code. Family Communication: Family at the bedside. Disposition Plan: Home. Consults called: None. Admission status: Inpatient.   Rise Patience MD [facility] Pager [phone].  If 7PM-7AM, please contact night-coverage [email or web address] Password [email or web address]  07/08/2019, 12:16 AM

## 2019-07-08 NOTE — Progress Notes (Signed)
PROGRESS NOTE    [name]  [id number] DOB: [birth year] DOA: 07/07/2019 PCP: Rogers Blocker, MD      Brief Narrative:  [name] is a 48 y.o. M with morbid obesity, OSA, schizophrenia, and HTN who presented with back pain and fever.   In the ER, CT showed extensive cellulitis of upper back.  Also thymoma.  Started on antibiotics and admitted to medicine.       Assessment & Plan:  Cellulitis with mild sepsis Fever and leukocytocisis without end organ damage or elevated lactic acid. Hemodynamically stable.    No fluid collection on CT at this time. -Narrow to clindamycin -Hot compresses -Serial exams -See image below   Possible thymoma CT reports incidental finding of "numerous ground glass and soft tissue nodules at location of thymus", possible thymoma or residual thymus. -Consult CT surgery, appreciate expertise  Hypertension BP mostly controlled here -Continue irbesartan  Diabetes Glucoses uncontrolled.  Reports he has always been told pre-diabetes, but my doctor "just wants to treat it".  A1c 9.5%. -Add Lantus -Continue SSI, increase dose -Continue Crestor -Hold glipizide  Schizophrenia This is a chart history from 2012.   Not on meds.  Other medications -Continue Flomax        DVT prophylaxis: Lovenox Code Status: FULL Family Communication:  MDM and disposition Plan: This is a no charge note.  For further details, please see H&P by my partner Dr. Hal Hope from earlier today.  The below labs and imaging reports were reviewed and summarized above.    The patient was admitted with mild sepsis from cellulitis.          Objective: Vitals:   07/07/19 2256 07/08/19 0023 07/08/19 0406 07/08/19 0602  BP: (!) 151/83 (!) 148/96 137/87   Pulse: 76 86 88 87  Resp: 17 18 20  (!) 23  Temp:  98.9 F (37.2 C) (!) 100.9 F (38.3 C) 99.2 F (37.3 C)  TempSrc:  Oral Oral Oral  SpO2: 95% 96% 93% 94%  Weight:  (!) 176.4 kg    Height:  6\' 3"   (1.905 m)      Intake/Output Summary (Last 24 hours) at 07/08/2019 1034 Last data filed at 07/08/2019 0916 Gross per 24 hour  Intake 3585.42 ml  Output 750 ml  Net 2835.42 ml   Filed Weights   07/07/19 2000 07/08/19 0023  Weight: (!) 171.9 kg (!) 176.4 kg    Examination: The patient was seen and examined.      Data Reviewed: I have personally reviewed following labs and imaging studies:  CBC: Recent Labs  Lab 07/07/19 1600 07/08/19 0412  WBC 17.3* 16.2*  NEUTROABS 13.9* 13.4*  HGB 13.4 12.5*  HCT 41.5 38.0*  MCV 88.5 87.2  PLT 238 [id number]   Basic Metabolic Panel: Recent Labs  Lab 07/07/19 1600 07/08/19 0412  NA 130* 132*  K 4.1 4.0  CL 98 100  CO2 23 21*  GLUCOSE 268* 292*  BUN 13 12  CREATININE 1.22 1.14  CALCIUM 9.0 8.7*   GFR: Estimated Creatinine Clearance: 134.5 mL/min (by C-G formula based on SCr of 1.14 mg/dL). Liver Function Tests: Recent Labs  Lab 07/07/19 1600 07/08/19 0412  AST 14* 13*  ALT 17 18  ALKPHOS 99 92  BILITOT 1.4* 1.1  PROT 8.1 7.3  ALBUMIN 3.0* 2.7*   No results for input(s): LIPASE, AMYLASE in the last 168 hours. No results for input(s): AMMONIA in the last 168 hours. Coagulation Profile: No results for input(s): INR,  PROTIME in the last 168 hours. Cardiac Enzymes: Recent Labs  Lab 07/08/19 0412  CKTOTAL 210   BNP (last 3 results) No results for input(s): PROBNP in the last 8760 hours. HbA1C: Recent Labs    07/08/19 0412  HGBA1C 9.5*   CBG: Recent Labs  Lab 07/08/19 0036 07/08/19 0411  GLUCAP 317* 293*   Lipid Profile: No results for input(s): CHOL, HDL, LDLCALC, TRIG, CHOLHDL, LDLDIRECT in the last 72 hours. Thyroid Function Tests: No results for input(s): TSH, T4TOTAL, FREET4, T3FREE, THYROIDAB in the last 72 hours. Anemia Panel: No results for input(s): VITAMINB12, FOLATE, FERRITIN, TIBC, IRON, RETICCTPCT in the last 72 hours. Urine analysis:    Component Value Date/Time   COLORURINE YELLOW  03/22/2014 0401   APPEARANCEUR CLEAR 03/22/2014 0401   LABSPEC 1.025 03/22/2014 0401   PHURINE 5.5 03/22/2014 0401   GLUCOSEU NEGATIVE 03/22/2014 0401   HGBUR NEGATIVE 03/22/2014 0401   HGBUR small 10/17/2008 0000   BILIRUBINUR neg 08/11/2015 1652   KETONESUR NEGATIVE 03/22/2014 0401   PROTEINUR 3+ 08/11/2015 1652   PROTEINUR >300 (A) 03/22/2014 0401   UROBILINOGEN 0.2 08/11/2015 1652   UROBILINOGEN 0.2 03/22/2014 0401   NITRITE neg 08/11/2015 1652   NITRITE NEGATIVE 03/22/2014 0401   LEUKOCYTESUR Negative 08/11/2015 1652   Sepsis Labs: @LABRCNTIP (procalcitonin:4,lacticacidven:4)  ) Recent Results (from the past 240 hour(s))  SARS CORONAVIRUS 2 (TAT 6-24 HRS) Nasopharyngeal Nasopharyngeal Swab     Status: None   Collection Time: 07/07/19  6:05 PM   Specimen: Nasopharyngeal Swab  Result Value Ref Range Status   SARS Coronavirus 2 NEGATIVE NEGATIVE Final    Comment: (NOTE) SARS-CoV-2 target nucleic acids are NOT DETECTED. The SARS-CoV-2 RNA is generally detectable in upper and lower respiratory specimens during the acute phase of infection. Negative results do not preclude SARS-CoV-2 infection, do not rule out co-infections with other pathogens, and should not be used as the sole basis for treatment or other patient management decisions. Negative results must be combined with clinical observations, patient history, and epidemiological information. The expected result is Negative. Fact Sheet for Patients: SugarRoll.be Fact Sheet for Healthcare Providers: https://www.woods-mathews.com/ This test is not yet approved or cleared by the [place] FDA and  has been authorized for detection and/or diagnosis of SARS-CoV-2 by FDA under an Emergency Use Authorization (EUA). This EUA will remain  in effect (meaning this test can be used) for the duration of the COVID-19 declaration under Section 56 4(b)(1) of the Act, 21 U.S.C. section  360bbb-3(b)(1), unless the authorization is terminated or revoked sooner. Performed at [facility], [place] [address], [place], [place] [address]          Radiology Studies: Ct Chest W Contrast  Result Date: 07/07/2019 CLINICAL DATA:  Swelling redness of the left upper back. EXAM: CT CHEST WITH CONTRAST TECHNIQUE: Multidetector CT imaging of the chest was performed during intravenous contrast administration. CONTRAST:  16mL OMNIPAQUE IOHEXOL 300 MG/ML  SOLN COMPARISON:  None. FINDINGS: Cardiovascular: No significant vascular findings. Normal heart size. No pericardial effusion. Mediastinum/Nodes: No enlarged mediastinal, hilar, or axillary lymph nodes. Thyroid gland, trachea, and esophagus demonstrate no significant findings. Numerous ground-glass and soft tissue nodules within the anterior mediastinal fat, at the anatomic location of the thymus. Lungs/Pleura: Lungs are clear. No pleural effusion or pneumothorax. Upper Abdomen: No acute abnormality. Musculoskeletal: No fracture is seen. In correlation to the area of clinical concern, there is skin thickening and extensive subcutaneous fat stranding, to the level of the outer muscular  fascia in the right upper back. No drainable fluid collection is seen. The skin thickening extends into the inferior back. Gynecomastia. IMPRESSION: 1. Numerous ground-glass and soft tissue nodules within the anterior mediastinal fat, at the anatomic location of the thymus. This may represent residual thymus or thymoma. Please correlate clinically. Close follow-up or tissue diagnosis may be considered. 2. In correlation to the area of clinical concern, there is skin thickening and extensive subcutaneous fat stranding, to the level of the outer muscular fascia in the right upper back. No drainable fluid collection is seen. The skin thickening extends into the inferior back. Electronically Signed   By: Fidela Salisbury M.D.   On: 07/07/2019 19:25         Scheduled Meds:  enoxaparin (LOVENOX) injection  40 mg Subcutaneous Q24H   insulin aspart  0-20 Units Subcutaneous TID WC   insulin aspart  0-5 Units Subcutaneous QHS   insulin glargine  20 Units Subcutaneous Daily   irbesartan  300 mg Oral Daily   rosuvastatin  10 mg Oral Daily   tamsulosin  0.4 mg Oral QHS   Continuous Infusions:  clindamycin (CLEOCIN) IV       LOS: 1 day    Time spent: 71minutes    Edwin Dada, MD [facility] 07/08/2019, 10:34 AM     Pager [phone] --- please page though AMION:  [email or web address] Password [email or web address] If 7PM-7AM, please contact night-coverage

## 2019-07-08 NOTE — Progress Notes (Signed)
Patient received via stretcher with E.R. staff. Alert and orin. Orin. To room R.N. into assess. Patient refusing to take Insulin at this time. He has never had it before. CBG done and it is 317. Patient stated he just ate graham crackers and Ginger Ale before coming to floor. Text page X. Blount N.P.  The above.

## 2019-07-08 NOTE — Consult Note (Signed)
[address]       [place] [address]             [phone]                    Ubaldo D Inghram Lemay Medical Record [id number] Date of Birth: [birth year]  Referring: No ref. provider found Primary Care: Rogers Blocker, MD Primary Cardiologist: No primary care provider on file.  Chief Complaint:    Chief Complaint  Patient presents with   Abscess    History of Present Illness:    [name] 48 y.o. male with history of hypertension, hyperlipidemia, sleep apnea, prediabetes presents to the ER with complaints of pain and fever chills over the last 1 week.  Patient stated started in the mid upper back which has gradually worsened towards her right upper back.  Cross-sectional imaging revealed a anterior mediastinal mass concerning for thymoma.  CTS was consulted to assist with management       Zubrod Score: At the time of surgery this patients most appropriate activity status/level should be described as: [x]     0    Normal activity, no symptoms []     1    Restricted in physical strenuous activity but ambulatory, able to do out light work []     2    Ambulatory and capable of self care, unable to do work activities, up and about               >50 % of waking hours                              []     3    Only limited self care, in bed greater than 50% of waking hours []     4    Completely disabled, no self care, confined to bed or chair []     5    Moribund   Past Medical History:  Diagnosis Date   Ankle fracture, right    Bell's palsy    Bipolar affective disorder ([place])    Diabetes mellitus    Gout    Hyperlipidemia    Per pt - reported on 03/07/12   Hypertension    Obesity    Sleep apnea     Past Surgical History:  Procedure Laterality Date   KELOID EXCISION     VASECTOMY      Family History  Problem Relation Age of Onset   Hypertension Mother    Diabetes Mother    Hyperlipidemia Mother    Pancreatitis Father      Arthritis Father    Heart attack Brother        49   Heart disease Brother        MI   Heart attack Maternal Grandmother    Cancer Maternal Grandmother        breast   Kidney disease Sister        dialysis   Lupus Sister    Stroke Sister    Diabetes Maternal Aunt    Heart disease Maternal Aunt    Diabetes Paternal Aunt    Heart disease Paternal Aunt        pacemaker   Heart disease Paternal Uncle        pacemaker   Diabetes Paternal Grandmother    Stroke Paternal Aunt    COPD Maternal Aunt  Heart disease Maternal Aunt    Throat cancer Other        aunt   Kidney failure Other      Social History   Tobacco Use  Smoking Status Former Smoker   Packs/day: 0.50   Years: 25.00   Pack years: 12.50   Types: Cigarettes   Quit date: 04/18/2019   Years since quitting: 0.2  Smokeless Tobacco Never Used  Tobacco Comment   stopped 04/18/19    Social History   Substance and Sexual Activity  Alcohol Use No     Allergies  Allergen Reactions   Cucumber Extract Shortness Of Breath    Current Facility-Administered Medications  Medication Dose Route Frequency Provider Last Rate Last Dose   acetaminophen (TYLENOL) tablet 650 mg  650 mg Oral Q6H PRN Rise Patience, MD   650 mg at 07/08/19 1117   Or   acetaminophen (TYLENOL) suppository 650 mg  650 mg Rectal Q6H PRN Rise Patience, MD       clindamycin (CLEOCIN) IVPB 600 mg  600 mg Intravenous Q8H Danford, Suann Larry, MD 100 mL/hr at 07/08/19 1439 600 mg at 07/08/19 1439   enoxaparin (LOVENOX) injection 90 mg  90 mg Subcutaneous Q24H Danford, Christopher P, MD       insulin aspart (novoLOG) injection 0-20 Units  0-20 Units Subcutaneous TID WC Danford, Suann Larry, MD       insulin aspart (novoLOG) injection 0-5 Units  0-5 Units Subcutaneous QHS Danford, Suann Larry, MD       insulin glargine (LANTUS) injection 20 Units  20 Units Subcutaneous Daily Edwin Dada, MD    20 Units at 07/08/19 1240   irbesartan (AVAPRO) tablet 300 mg  300 mg Oral Daily Rise Patience, MD   300 mg at 07/08/19 1117   ondansetron (ZOFRAN) tablet 4 mg  4 mg Oral Q6H PRN Rise Patience, MD       Or   ondansetron [facility]) injection 4 mg  4 mg Intravenous Q6H PRN Rise Patience, MD       oxyCODONE (Oxy IR/ROXICODONE) immediate release tablet 5 mg  5 mg Oral Q6H PRN Edwin Dada, MD   5 mg at 07/08/19 1443   rosuvastatin (CRESTOR) tablet 10 mg  10 mg Oral Daily Rise Patience, MD   10 mg at 07/08/19 1118   tamsulosin (FLOMAX) capsule 0.4 mg  0.4 mg Oral QHS Rise Patience, MD   0.4 mg at 07/08/19 0116    Review of Systems  Constitutional: Positive for chills, diaphoresis, fever and malaise/fatigue. Negative for weight loss.  HENT: Negative.   Eyes: Negative.   Respiratory: Negative for cough and shortness of breath.   Cardiovascular: Negative.   Gastrointestinal: Negative.   Genitourinary: Negative.   Musculoskeletal: Positive for falls.  Neurological: Positive for dizziness.  Psychiatric/Behavioral: Negative.      PHYSICAL EXAMINATION: BP 137/87 (BP Location: Right Arm)    Pulse 87    Temp 99.2 F (37.3 C) (Oral)    Resp (!) 23    Ht 6\' 3"  (1.905 m)    Wt (!) 176.4 kg    SpO2 94%    BMI 48.62 kg/m  Physical Exam  Constitutional: He is oriented to person, place, and time. He appears well-developed and well-nourished. No distress.  HENT:  Head: Normocephalic and atraumatic.  Mouth/Throat: Oropharynx is clear and moist. No oropharyngeal exudate.  Eyes: Conjunctivae are normal. No scleral icterus.  Neck: Normal range of motion.  No tracheal deviation present.  Cardiovascular: Normal rate and regular rhythm.  Respiratory: Effort normal. No respiratory distress.  GI: He exhibits no distension.  Neurological: He is alert and oriented to person, place, and time.  Skin: Skin is warm and dry. He is not diaphoretic.  Psychiatric: He  has a normal mood and affect.    Diagnostic Studies & Laboratory data:     Recent Radiology Findings:   Ct Chest W Contrast  Result Date: 07/07/2019 CLINICAL DATA:  Swelling redness of the left upper back. EXAM: CT CHEST WITH CONTRAST TECHNIQUE: Multidetector CT imaging of the chest was performed during intravenous contrast administration. CONTRAST:  73mL OMNIPAQUE IOHEXOL 300 MG/ML  SOLN COMPARISON:  None. FINDINGS: Cardiovascular: No significant vascular findings. Normal heart size. No pericardial effusion. Mediastinum/Nodes: No enlarged mediastinal, hilar, or axillary lymph nodes. Thyroid gland, trachea, and esophagus demonstrate no significant findings. Numerous ground-glass and soft tissue nodules within the anterior mediastinal fat, at the anatomic location of the thymus. Lungs/Pleura: Lungs are clear. No pleural effusion or pneumothorax. Upper Abdomen: No acute abnormality. Musculoskeletal: No fracture is seen. In correlation to the area of clinical concern, there is skin thickening and extensive subcutaneous fat stranding, to the level of the outer muscular fascia in the right upper back. No drainable fluid collection is seen. The skin thickening extends into the inferior back. Gynecomastia. IMPRESSION: 1. Numerous ground-glass and soft tissue nodules within the anterior mediastinal fat, at the anatomic location of the thymus. This may represent residual thymus or thymoma. Please correlate clinically. Close follow-up or tissue diagnosis may be considered. 2. In correlation to the area of clinical concern, there is skin thickening and extensive subcutaneous fat stranding, to the level of the outer muscular fascia in the right upper back. No drainable fluid collection is seen. The skin thickening extends into the inferior back. Electronically Signed   By: Fidela Salisbury M.D.   On: 07/07/2019 19:25       I have independently reviewed the above radiology studies  and reviewed the findings with  the patient.   Recent Lab Findings: Lab Results  Component Value Date   WBC 16.2 (H) 07/08/2019   HGB 12.5 (L) 07/08/2019   HCT 38.0 (L) 07/08/2019   PLT 212 07/08/2019   GLUCOSE 292 (H) 07/08/2019   CHOL 204 (H) 08/11/2015   TRIG 286.0 (H) 08/11/2015   HDL 27.80 (L) 08/11/2015   LDLDIRECT 116.0 08/11/2015   LDLCALC 148 (H) 02/20/2014   ALT 18 07/08/2019   AST 13 (L) 07/08/2019   NA 132 (L) 07/08/2019   K 4.0 07/08/2019   CL 100 07/08/2019   CREATININE 1.14 07/08/2019   BUN 12 07/08/2019   CO2 21 (L) 07/08/2019   TSH 4.94 (H) 08/11/2015   INR 1.02 12/20/2015   HGBA1C 9.5 (H) 07/08/2019         Assessment / Plan:   48 year old male with anterior mediastinal mass with multiple soft tissue nodules within the tissue.  He denies any neurological or musculoskeletal symptoms consistent with myasthenia gravis.  He did state that when he was 48 years old he had a ruptured testicle and there is some scar formation in that area.  Given the differential for an anterior mediastinal mass he will require a complete work-up most of which can be completed as an outpatient.  This work-up will consist of tumor markers, an MRI chest, and a scrotal ultrasound.  I will order these tests since he is currently admitted but they can  also be completed as an outpatient if his discharge is pending.  He can follow-up with the cardiothoracic service once released from the hospital.     I  spent 40 minutes with  the patient face to face and greater then 50% of the time was spent in counseling and coordination of care.    Lajuana Matte 07/08/2019 3:28 PM

## 2019-07-08 NOTE — Progress Notes (Signed)
Inpatient Diabetes Program Recommendations  AACE/ADA: New Consensus Statement on Inpatient Glycemic Control (2015)  Target Ranges:  Prepandial:   less than 140 mg/dL      Peak postprandial:   less than 180 mg/dL (1-2 hours)      Critically ill patients:  140 - 180 mg/dL   Lab Results  Component Value Date   GLUCAP 293 (H) 07/08/2019   HGBA1C 9.5 (H) 07/08/2019    Review of Glycemic Control  Diabetes history: DM2 Outpatient Diabetes medications: glipizide 5 mg QAM Current orders for Inpatient glycemic control: Novolog 0-9 units Q4H  HgbA1C - 9.5% - may need to go home on insulin. Needs tighter control for healing  Inpatient Diabetes Program Recommendations:     Add Lantus 20 units Q24H Increase Novolog to 0-15 units tidwc and 0-5 units QHS Will likely need meal coverage if eating > 50% - Novolog 4 units tidwc.  Will speak with pt about importance of good glycemic control. Will discuss HgbA1C of 9.5%, monitoring blood sugars at home, and f/u with PCP for diabetes management.  Continue to follow while inpatient.  Thank you. Lorenda Peck, RD, LDN, CDE Inpatient Diabetes Coordinator [phone]

## 2019-07-08 NOTE — Progress Notes (Signed)
Text page X. Blount N.P. again for Diet and CPAP orders.

## 2019-07-09 ENCOUNTER — Inpatient Hospital Stay (HOSPITAL_COMMUNITY): Payer: Medicare Other

## 2019-07-09 ENCOUNTER — Encounter (HOSPITAL_COMMUNITY): Payer: Self-pay | Admitting: General Surgery

## 2019-07-09 DIAGNOSIS — G4733 Obstructive sleep apnea (adult) (pediatric): Secondary | ICD-10-CM

## 2019-07-09 DIAGNOSIS — R739 Hyperglycemia, unspecified: Secondary | ICD-10-CM

## 2019-07-09 LAB — BASIC METABOLIC PANEL
Anion gap: 11 (ref 5–15)
BUN: 10 mg/dL (ref 6–20)
CO2: 24 mmol/L (ref 22–32)
Calcium: 8.7 mg/dL — ABNORMAL LOW (ref 8.9–10.3)
Chloride: 98 mmol/L (ref 98–111)
Creatinine, Ser: 1.26 mg/dL — ABNORMAL HIGH (ref 0.61–1.24)
GFR calc Af Amer: >60 mL/min (ref >60)
GFR calc non Af Amer: >60 mL/min (ref >60)
Glucose, Bld: 223 mg/dL — ABNORMAL HIGH (ref 70–99)
Potassium: 3.7 mmol/L (ref 3.5–5.1)
Sodium: 133 mmol/L — ABNORMAL LOW (ref 135–145)

## 2019-07-09 LAB — GLUCOSE, CAPILLARY
Glucose-Capillary: 228 mg/dL — ABNORMAL HIGH (ref 70–99)
Glucose-Capillary: 179 mg/dL — ABNORMAL HIGH (ref 70–99)
Glucose-Capillary: 232 mg/dL — ABNORMAL HIGH (ref 70–99)
Glucose-Capillary: 274 mg/dL — ABNORMAL HIGH (ref 70–99)

## 2019-07-09 LAB — CBC
HCT: 37.8 % — ABNORMAL LOW (ref 39.0–52.0)
Hemoglobin: 12.6 g/dL — ABNORMAL LOW (ref 13.0–17.0)
MCH: 29.3 pg (ref 26.0–34.0)
MCHC: 33.3 g/dL (ref 30.0–36.0)
MCV: 87.9 fL (ref 80.0–100.0)
Platelets: 188 10*3/uL (ref 150–400)
RBC: 4.3 MIL/uL (ref 4.22–5.81)
RDW: 12.8 % (ref 11.5–15.5)
WBC: 17.5 10*3/uL — ABNORMAL HIGH (ref 4.0–10.5)
nRBC: 0 % (ref 0.0–0.2)

## 2019-07-09 LAB — AFP TUMOR MARKER: AFP, Serum, Tumor Marker: 2.5 ng/mL (ref 0.0–8.3)

## 2019-07-09 LAB — ACETYLCHOLINE RECEPTOR, BINDING: Acety choline binding ab: 0.11 nmol/L (ref 0.00–0.24)

## 2019-07-09 MED ORDER — MORPHINE SULFATE (PF) 4 MG/ML IV SOLN: 4.0000 mg | Freq: Once | INTRAVENOUS | Status: DC | PRN

## 2019-07-09 MED ORDER — INSULIN GLARGINE 100 UNIT/ML ~~LOC~~ SOLN
25.0000 [IU] | Freq: Every day | SUBCUTANEOUS | Status: DC
  Administered 2019-07-09 – 2019-07-11 (×3): 25 [IU] via SUBCUTANEOUS
  Filled 2019-07-09 (×4): qty 0.25

## 2019-07-09 MED ORDER — INSULIN ASPART 100 UNIT/ML ~~LOC~~ SOLN
4.0000 [IU] | Freq: Three times a day (TID) | SUBCUTANEOUS | Status: DC
  Administered 2019-07-09 – 2019-07-16 (×17): 4 [IU] via SUBCUTANEOUS

## 2019-07-09 MED ORDER — VANCOMYCIN HCL 10 G IV SOLR
1750.0000 mg | Freq: Two times a day (BID) | INTRAVENOUS | Status: DC
  Administered 2019-07-09: 1750 mg via INTRAVENOUS
  Filled 2019-07-09 (×2): qty 1750

## 2019-07-09 MED ORDER — VANCOMYCIN HCL 10 G IV SOLR
2500.0000 mg | Freq: Once | INTRAVENOUS | Status: AC
  Administered 2019-07-09: 2500 mg via INTRAVENOUS
  Filled 2019-07-09: qty 2500

## 2019-07-09 NOTE — Progress Notes (Signed)
PROGRESS NOTE    [name]  [id number] DOB: 1970/12/07 DOA: 07/07/2019 PCP: Justin Blocker, MD      Brief Narrative:  Mr. [name] is a 48 y.o. M with morbid obesity, OSA, schizophrenia, and HTN who presented with back pain and fever.   In the ER, CT showed extensive cellulitis of upper back.  Also thymoma.  Started on antibiotics and admitted to medicine.       Assessment & Plan:  Cellulitis with mild sepsis Fever and leukocytocisis without end organ damage or elevated lactic acid on admission.  Recurrent fever overnight, with hypotension, both resolved with ibuprofen.    Currently hemodynamically stable, but pain worse, spot slightly larger.  No dramatic progression, feels fluctuant today.  More purulent drainage today. -Stop clindamycin -Start vancomycin again -Consult General Surgery for I&D    Possible thymoma CT reports incidental finding of "numerous ground glass and soft tissue nodules at location of thymus", possible thymoma or residual thymus. -Consult CT surgery, appreciate expertise --> needs outpatient follow up with Dr. Kipp [name] for follow up labs, as well as ordering MRI chest and/or scrotal ultrasound  Hypertension BP soft overnight -Hold irbesartan  Diabetes Glucoses improved but still high.  Reports he has always been told pre-diabetes, but my doctor "just wants to treat it".  A1c 9.5%. -Increase Lantus -Continue SSI -Add mealtime insulin -Continue  Crestor -Hold glipizide  Schizophrenia This is a chart history from 2012.   Not on meds.  Other medications -Continue  Flomax   Hyponatremia Mild -Monitor BMP       MDM and disposition: The below labs and imaging reports were reviewed and summarized above.  Medication management as above.  The patient was admitted with back abscess. He was febrile and hypotensive overnight despite antibiotics.  Will need evaluation for I&D, continued IV vancomycin.            DVT prophylaxis: Lovenox Code Status: FULL Family Communication:     Consultants:   CT surgery  General Surgery  Procedures:   10/11 CT chest -- no abscess  10/13 [place] back -- 2x4 abscess  Antimicrobials:   Vancomycin 10/11 >>  Clindamycin x1 day  Zosyn x1 day    Subjective: Back pain worse.  Fever and malaise overnight.  No vomiting, confusion.  No chest pain, dyspnea.  No diplopia, lid lag.  Objective: Vitals:   07/09/19 0500 07/09/19 0535 07/09/19 0900 07/09/19 1006  BP:  124/76  134/80  Pulse:  90 85 85  Resp:  (!) 21 (!) 23 14  Temp:  99.2 F (37.3 C)  99.1 F (37.3 C)  TempSrc:  Oral  Oral  SpO2:  93% 93% 96%  Weight: (!) 175.3 kg     Height:        Intake/Output Summary (Last 24 hours) at 07/09/2019 1623 Last data filed at 07/09/2019 1600 Gross per 24 hour  Intake 1313.19 ml  Output 1075 ml  Net 238.19 ml   Filed Weights   07/07/19 2000 07/08/19 0023 07/09/19 0500  Weight: (!) 171.9 kg (!) 176.4 kg (!) 175.3 kg    Examination: General appearance: adult male, alert and in no acute distress.  Appears tired HEENT: Anicteric, conjunctiva pink, lids and lashes normal. No nasal deformity, discharge, epistaxis.  Lips moist, dentition normal, OP moist, no oral lesions hearing normal.   Skin: Warm and diaphoretic.  No jaundice.  No suspicious rashes or lesions.     Cardiac: RRR, nl S1-S2, no murmurs appreciated.  Capillary refill is brisk.  JVP not visible.  No LE edema.  Radia  pulses 2+ and symmetric. Respiratory: Normal respiratory rate and rhythm.  CTAB without rales or wheezes. Abdomen: Abdomen soft.  No TTP. No ascites, distension, hepatosplenomegaly.   MSK: No deformities or effusions. Neuro: Awake and alert.  EOMI, moves all extremities. Speech fluent.    Psych: Sensorium intact and responding to questions, attention normal. Affect normal.  Judgment and insight appear normal.    Data Reviewed: I have personally reviewed following labs  and imaging studies:  CBC: Recent Labs  Lab 07/07/19 1600 07/08/19 0412 07/09/19 0224  WBC 17.3* 16.2* 17.5*  NEUTROABS 13.9* 13.4*  --   HGB 13.4 12.5* 12.6*  HCT 41.5 38.0* 37.8*  MCV 88.5 87.2 87.9  PLT 238 212 0000000   Basic Metabolic Panel: Recent Labs  Lab 07/07/19 1600 07/08/19 0412 07/09/19 0224  NA 130* 132* 133*  K 4.1 4.0 3.7  CL 98 100 98  CO2 23 21* 24  GLUCOSE 268* 292* 223*  BUN 13 12 10   CREATININE 1.22 1.14 1.26*  CALCIUM 9.0 8.7* 8.7*   GFR: Estimated Creatinine Clearance: 121.2 mL/min (A) (by C-G formula based on SCr of 1.26 mg/dL (H)). Liver Function Tests: Recent Labs  Lab 07/07/19 1600 07/08/19 0412  AST 14* 13*  ALT 17 18  ALKPHOS 99 92  BILITOT 1.4* 1.1  PROT 8.1 7.3  ALBUMIN 3.0* 2.7*   No results for input(s): LIPASE, AMYLASE in the last 168 hours. No results for input(s): AMMONIA in the last 168 hours. Coagulation Profile: No results for input(s): INR, PROTIME in the last 168 hours. Cardiac Enzymes: Recent Labs  Lab 07/08/19 0412  CKTOTAL 210   BNP (last 3 results) No results for input(s): PROBNP in the last 8760 hours. HbA1C: Recent Labs    07/08/19 0412  HGBA1C 9.5*   CBG: Recent Labs  Lab 07/08/19 1620 07/08/19 2200 07/09/19 0623 07/09/19 1209 07/09/19 1550  GLUCAP 220* 232* 228* 179* 232*   Lipid Profile: No results for input(s): CHOL, HDL, LDLCALC, TRIG, CHOLHDL, LDLDIRECT in the last 72 hours. Thyroid Function Tests: No results for input(s): TSH, T4TOTAL, FREET4, T3FREE, THYROIDAB in the last 72 hours. Anemia Panel: No results for input(s): VITAMINB12, FOLATE, FERRITIN, TIBC, IRON, RETICCTPCT in the last 72 hours. Urine analysis:    Component Value Date/Time   COLORURINE YELLOW 03/22/2014 0401   APPEARANCEUR CLEAR 03/22/2014 0401   LABSPEC 1.025 03/22/2014 0401   PHURINE 5.5 03/22/2014 0401   GLUCOSEU NEGATIVE 03/22/2014 0401   HGBUR NEGATIVE 03/22/2014 0401   HGBUR small 10/17/2008 0000    BILIRUBINUR neg 08/11/2015 1652   KETONESUR NEGATIVE 03/22/2014 0401   PROTEINUR 3+ 08/11/2015 1652   PROTEINUR >300 (A) 03/22/2014 0401   UROBILINOGEN 0.2 08/11/2015 1652   UROBILINOGEN 0.2 03/22/2014 0401   NITRITE neg 08/11/2015 1652   NITRITE NEGATIVE 03/22/2014 0401   LEUKOCYTESUR Negative 08/11/2015 1652   Sepsis Labs: @LABRCNTIP (procalcitonin:4,lacticacidven:4)  ) Recent Results (from the past 240 hour(s))  SARS CORONAVIRUS 2 (TAT 6-24 HRS) Nasopharyngeal Nasopharyngeal Swab     Status: None   Collection Time: 07/07/19  6:05 PM   Specimen: Nasopharyngeal Swab  Result Value Ref Range Status   SARS Coronavirus 2 NEGATIVE NEGATIVE Final    Comment: (NOTE) SARS-CoV-2 target nucleic acids are NOT DETECTED. The SARS-CoV-2 RNA is generally detectable in upper and lower respiratory specimens during the acute phase of infection. Negative results do not preclude SARS-CoV-2 infection, do not  rule out co-infections with other pathogens, and should not be used as the sole basis for treatment or other patient management decisions. Negative results must be combined with clinical observations, patient history, and epidemiological information. The expected result is Negative. Fact Sheet for Patients: SugarRoll.be Fact Sheet for Healthcare Providers: https://www.woods-mathews.com/ This test is not yet approved or cleared by the [place] FDA and  has been authorized for detection and/or diagnosis of SARS-CoV-2 by FDA under an Emergency Use Authorization (EUA). This EUA will remain  in effect (meaning this test can be used) for the duration of the COVID-19 declaration under Section 56 4(b)(1) of the Act, 21 U.S.C. section 360bbb-3(b)(1), unless the authorization is terminated or revoked sooner. Performed at [facility], [place] [address] [place], [place] [address]   Culture, blood (routine x 2)     Status: None (Preliminary result)    Collection Time: 07/08/19  8:46 AM   Specimen: BLOOD LEFT HAND  Result Value Ref Range Status   Specimen Description BLOOD LEFT HAND  Final   Special Requests   Final    BOTTLES DRAWN AEROBIC ONLY Blood Culture results may not be optimal due to an inadequate volume of blood received in culture bottles   Culture   Final    NO GROWTH 1 DAY Performed at [facility], [place] [address] [place], [place] [address]    Report Status PENDING  Incomplete  Culture, blood (routine x 2)     Status: None (Preliminary result)   Collection Time: 07/08/19  8:51 AM   Specimen: BLOOD RIGHT HAND  Result Value Ref Range Status   Specimen Description BLOOD RIGHT HAND  Final   Special Requests   Final    BOTTLES DRAWN AEROBIC AND ANAEROBIC Blood Culture adequate volume   Culture   Final    NO GROWTH 1 DAY Performed at [facility], [place] [address], [place], [place] [address]    Report Status PENDING  Incomplete         Radiology Studies: Ct Chest W Contrast  Result Date: 07/07/2019 CLINICAL DATA:  Swelling redness of the left upper back. EXAM: CT CHEST WITH CONTRAST TECHNIQUE: Multidetector CT imaging of the chest was performed during intravenous contrast administration. CONTRAST:  58mL OMNIPAQUE IOHEXOL 300 MG/ML  SOLN COMPARISON:  None. FINDINGS: Cardiovascular: No significant vascular findings. Normal heart size. No pericardial effusion. Mediastinum/Nodes: No enlarged mediastinal, hilar, or axillary lymph nodes. Thyroid gland, trachea, and esophagus demonstrate no significant findings. Numerous ground-glass and soft tissue nodules within the anterior mediastinal fat, at the anatomic location of the thymus. Lungs/Pleura: Lungs are clear. No pleural effusion or pneumothorax. Upper Abdomen: No acute abnormality. Musculoskeletal: No fracture is seen. In correlation to the area of clinical concern, there is skin thickening and extensive subcutaneous fat stranding, to the level of the outer muscular  fascia in the right upper back. No drainable fluid collection is seen. The skin thickening extends into the inferior back. Gynecomastia. IMPRESSION: 1. Numerous ground-glass and soft tissue nodules within the anterior mediastinal fat, at the anatomic location of the thymus. This may represent residual thymus or thymoma. Please correlate clinically. Close follow-up or tissue diagnosis may be considered. 2. In correlation to the area of clinical concern, there is skin thickening and extensive subcutaneous fat stranding, to the level of the outer muscular fascia in the right upper back. No drainable fluid collection is seen. The skin thickening extends into the inferior back. Electronically Signed  By: Fidela Salisbury M.D.   On: 07/07/2019 19:25   [place] Chest Soft Tissue  Result Date: 07/09/2019 CLINICAL DATA:  Soft tissue abscess of the mid upper back. EXAM: ULTRASOUND OF HEAD/NECK SOFT TISSUES TECHNIQUE: Ultrasound examination of the head and neck soft tissues was performed in the area of clinical concern. COMPARISON:  None. FINDINGS: There is a well-defined heterogeneous 2.0 x 1.5 x 1.5 cm mass in the soft tissues in the area of concern. The abnormality has a 5 mm long tail which extends to the skin surface. No appreciable perfusion in the mass. IMPRESSION: Ultrasound exam is consistent with a subcutaneous abscess, as described. Electronically Signed   By: Lorriane Shire M.D.   On: 07/09/2019 13:53        Scheduled Meds: . enoxaparin (LOVENOX) injection  90 mg Subcutaneous Q24H  . insulin aspart  0-20 Units Subcutaneous TID WC  . insulin aspart  0-5 Units Subcutaneous QHS  . insulin aspart  4 Units Subcutaneous TID WC  . insulin glargine  25 Units Subcutaneous Daily  . rosuvastatin  10 mg Oral Daily  . tamsulosin  0.4 mg Oral QHS   Continuous Infusions: . vancomycin       LOS: 2 days    Time spent: 25 minutes    Edwin Dada, MD [facility] 07/09/2019, 4:23 PM      Please page through [facility]:  [email or web address] Password [email or web address] If 7PM-7AM, please contact night-coverage

## 2019-07-09 NOTE — Progress Notes (Signed)
Dressing change to back done per [facility] nurse recommendations.

## 2019-07-09 NOTE — Consult Note (Signed)
[facility] Nurse wound consult note Reason for Consult:Cellulitis to upper back.  Erythematous lesions with induration and oozing purulence.  Tender to touch.  Wound type:infectious Pressure Injury POA: NA Measurement: scattered nonintact pustules with 0.2 cm opening. Induration and erythema covers upper back.   Wound bed:not visible Drainage (amount, consistency, odor) moderate oozing.  Periwound:induration Dressing procedure/placement/frequency: Cleanse back with soap and water and pat dry.  Apply Aquacel Ag to oozing pustules.  Change Tues/Thurs/Saturday. Will not follow at this time.  Please re-consult if needed.  Domenic Moras MSN, RN, FNP-BC CWON Wound, Ostomy, Continence Nurse Pager [phone]

## 2019-07-09 NOTE — Plan of Care (Signed)
Continue to monitor

## 2019-07-09 NOTE — Procedures (Signed)
Patient declined the use of CPAP tonight.  

## 2019-07-09 NOTE — Progress Notes (Signed)
Pharmacy Antibiotic Note  [name] is a 48 y.o. male admitted on 07/07/2019 with cellulitis.  Pharmacy has been consulted for Vancomyin dosing.  ID: r/o sepsis with extensive cellulitis of upper back.- Tmax 101>>99.2>102.7, WBC 17.3>16.2>17.5 up, LA 1.4, PC 0.14  Vanc 10/11 >>10/11, 10/13>> Zosyn 10/11 >>10/12 Clind 10/12>>10/13  10/11 covid - negative 10/12: BC x 2>>  Vanc 2500mg  IV x 1, then 1750mg  IV Q12H for AUC 528 using SCr 1.22  Plan: Plan:  D/c Clindamycin Vancomycin resume 2500mg  IV x 1 then 1750mg  IV q 12h Lovenox 90mg /24h for 175kg. F/u weight to adjust dose.   Height: 6\' 3"  (190.5 cm) Weight: (!) 386 lb 6.4 oz (175.3 kg) IBW/kg (Calculated) : 84.5  Temp (24hrs), Avg:100.4 F (38 C), Min:98.5 F (36.9 C), Max:102.7 F (39.3 C)  Recent Labs  Lab 07/07/19 1600 07/08/19 0412 07/09/19 0224  WBC 17.3* 16.2* 17.5*  CREATININE 1.22 1.14 1.26*  LATICACIDVEN 1.4  --   --     Estimated Creatinine Clearance: 121.2 mL/min (A) (by C-G formula based on SCr of 1.26 mg/dL (H)).    Allergies  Allergen Reactions  . Cucumber Extract Shortness Of Breath    Liba Hulsey S. Alford Highland, PharmD, BCPS Clinical Staff Pharmacist Eilene Ghazi [place] 07/09/2019 10:33 AM

## 2019-07-09 NOTE — Progress Notes (Signed)
B.P.103/72 rt arm

## 2019-07-09 NOTE — Progress Notes (Signed)
Temp. 101.9 oral B.P. left arm   95/46 check B.P. in Right arm 99/46 Advil I tablet given for fever. Patient voiced no complaints Skin warm and dry R.N. aware will monitor V.S. and patient.

## 2019-07-09 NOTE — Consult Note (Signed)
[name] 1971-09-02  [id number].    Requesting MD: Dr. Norina Buzzard Chief Complaint/Reason for Consult: Back abscess  HPI:  This is a pleasant, morbidly obese, black male with a history of OSA, bipolar disorder (on disability), HTN, DM, and HLD who began having pain in his back about 1.5 weeks ago.  He began running fevers over 102 at home.  He has had nausea since this started.  Nothing has made this any better.  It continues to worsen with worsening pain.  He admits to some SOB within the last several days which is new and not his baseline.  This is more with ambulating.  He finally presented to the ED "Sunday night for evaluation.  He had a CT scan which revealed some skin thickening in his back but no definitive drainable fluid collection.  He was admitted and started on abx therapy.  He was also found to have a medistinal mass that TCTS has seen and will pursue work up as an outpatient.  We have been asked to see him for his back secondary to it's appearance with multiple pustules in the upper back.  ROS: ROS: Please see HPI, otherwise all other systems have been reviewed and are negative.  Wears CPAP  Family History  Problem Relation Age of Onset  . Hypertension Mother   . Diabetes Mother   . Hyperlipidemia Mother   . Pancreatitis Father   . Arthritis Father   . Heart attack Brother        35"   . Heart disease Brother        MI  . Heart attack Maternal Grandmother   . Cancer Maternal Grandmother        breast  . Kidney disease Sister        dialysis  . Lupus Sister   . Stroke Sister   . Diabetes Maternal Aunt   . Heart disease Maternal Aunt   . Diabetes Paternal Aunt   . Heart disease Paternal Aunt        pacemaker  . Heart disease Paternal Uncle        pacemaker  . Diabetes Paternal Grandmother   . Stroke Paternal Aunt   . COPD Maternal Aunt   . Heart disease Maternal Aunt   . Throat cancer Other        aunt  . Kidney failure Other     Past Medical  History:  Diagnosis Date  . Ankle fracture, right   . Bell's palsy   . Bipolar affective disorder ([place])   . Bipolar disorder ([place])   . Diabetes mellitus   . Gout   . Hyperlipidemia    Per pt - reported on 03/07/12  . Hypertension   . Obesity   . OSA (obstructive sleep apnea)   . Sleep apnea     Past Surgical History:  Procedure Laterality Date  . KELOID EXCISION    . VASECTOMY      Social History:  reports that he quit smoking about 2 months ago. His smoking use included cigarettes. He has a 12.50 pack-year smoking history. He has never used smokeless tobacco. He reports that he does not drink alcohol or use drugs.  Allergies:  Allergies  Allergen Reactions  . Cucumber Extract Shortness Of Breath    Medications Prior to Admission  Medication Sig Dispense Refill  . glipiZIDE (GLUCOTROL) 5 MG tablet Take 5 mg by mouth daily before breakfast.    . irbesartan (AVAPRO) 300 MG  tablet Take 300 mg by mouth daily.    . rosuvastatin (CRESTOR) 10 MG tablet Take 10 mg by mouth daily.    . tadalafil (CIALIS) 5 MG tablet Take 5 mg by mouth daily.    . tamsulosin (FLOMAX) 0.4 MG CAPS capsule Take 0.4 mg by mouth at bedtime.       Physical Exam: Blood pressure 134/80, pulse 85, temperature 99.1 F (37.3 C), temperature source Oral, resp. rate 14, height 6\' 3"  (1.905 m), weight (!) 175.3 kg, SpO2 96 %. General: pleasant, morbidly obese black male who is laying in bed in NAD HEENT: head is normocephalic, atraumatic.  Sclera are noninjected.  PERRL.  Ears and nose without any masses or lesions.  Mouth is pink and moist Heart: regular, rate, and rhythm.  Normal s1,s2. No obvious murmurs, gallops, or rubs noted.  Palpable radial and pedal pulses bilaterally Lungs: CTAB, no wheezes, rhonchi, or rales noted.  Respiratory effort nonlabored Abd: soft, NT, ND, obese, +BS, no masses, hernias, or organomegaly MS: all 4 extremities are symmetrical with no cyanosis, clubbing, or edema. Skin: warm  and dry with no masses, lesions, or rashes.  He has a large back abscess.  Central portion with multiple pustules and 1 area that appears to be a tract.  The entirety of this infection, including induration measures about 20x15cm.  This is very tender.  Psych: A&Ox3 with an appropriate affect.   Results for orders placed or performed during the hospital encounter of 07/07/19 (from the past 48 hour(s))  SARS CORONAVIRUS 2 (TAT 6-24 HRS) Nasopharyngeal Nasopharyngeal Swab     Status: None   Collection Time: 07/07/19  6:05 PM   Specimen: Nasopharyngeal Swab  Result Value Ref Range   SARS Coronavirus 2 NEGATIVE NEGATIVE    Comment: (NOTE) SARS-CoV-2 target nucleic acids are NOT DETECTED. The SARS-CoV-2 RNA is generally detectable in upper and lower respiratory specimens during the acute phase of infection. Negative results do not preclude SARS-CoV-2 infection, do not rule out co-infections with other pathogens, and should not be used as the sole basis for treatment or other patient management decisions. Negative results must be combined with clinical observations, patient history, and epidemiological information. The expected result is Negative. Fact Sheet for Patients: SugarRoll.be Fact Sheet for Healthcare Providers: https://www.woods-mathews.com/ This test is not yet approved or cleared by the [place] FDA and  has been authorized for detection and/or diagnosis of SARS-CoV-2 by FDA under an Emergency Use Authorization (EUA). This EUA will remain  in effect (meaning this test can be used) for the duration of the COVID-19 declaration under Section 56 4(b)(1) of the Act, 21 U.S.C. section 360bbb-3(b)(1), unless the authorization is terminated or revoked sooner. Performed at [facility], [place] [address], [place], [place] [address]   Glucose, capillary     Status: Abnormal   Collection Time: 07/08/19 12:36 AM  Result Value Ref Range    Glucose-Capillary 317 (H) 70 - 99 mg/dL  Glucose, capillary     Status: Abnormal   Collection Time: 07/08/19  4:11 AM  Result Value Ref Range   Glucose-Capillary 293 (H) 70 - 99 mg/dL  CBC with Differential/Platelet     Status: Abnormal   Collection Time: 07/08/19  4:12 AM  Result Value Ref Range   WBC 16.2 (H) 4.0 - 10.5 K/uL   RBC 4.36 4.22 - 5.81 MIL/uL   Hemoglobin 12.5 (L) 13.0 - 17.0 g/dL   HCT 38.0 (L) 39.0 - 52.0 %   MCV  87.2 80.0 - 100.0 fL   MCH 28.7 26.0 - 34.0 pg   MCHC 32.9 30.0 - 36.0 g/dL   RDW 12.7 11.5 - 15.5 %   Platelets 212 150 - 400 K/uL   nRBC 0.0 0.0 - 0.2 %   Neutrophils Relative % 82 %   Neutro Abs 13.4 (H) 1.7 - 7.7 K/uL   Lymphocytes Relative 6 %   Lymphs Abs 0.9 0.7 - 4.0 K/uL   Monocytes Relative 8 %   Monocytes Absolute 1.3 (H) 0.1 - 1.0 K/uL   Eosinophils Relative 3 %   Eosinophils Absolute 0.5 0.0 - 0.5 K/uL   Basophils Relative 0 %   Basophils Absolute 0.1 0.0 - 0.1 K/uL   Immature Granulocytes 1 %   Abs Immature Granulocytes 0.08 (H) 0.00 - 0.07 K/uL    Comment: Performed at [place] [address], [place], [place] [address]  Comprehensive metabolic panel     Status: Abnormal   Collection Time: 07/08/19  4:12 AM  Result Value Ref Range   Sodium 132 (L) 135 - 145 mmol/L   Potassium 4.0 3.5 - 5.1 mmol/L   Chloride 100 98 - 111 mmol/L   CO2 21 (L) 22 - 32 mmol/L   Glucose, Bld 292 (H) 70 - 99 mg/dL   BUN 12 6 - 20 mg/dL   Creatinine, Ser 1.14 0.61 - 1.24 mg/dL   Calcium 8.7 (L) 8.9 - 10.3 mg/dL   Total Protein 7.3 6.5 - 8.1 g/dL   Albumin 2.7 (L) 3.5 - 5.0 g/dL   AST 13 (L) 15 - 41 U/L   ALT 18 0 - 44 U/L   Alkaline Phosphatase 92 38 - 126 U/L   Total Bilirubin 1.1 0.3 - 1.2 mg/dL   GFR calc non Af Amer >60 >60 mL/min   GFR calc Af Amer >60 >60 mL/min   Anion gap 11 5 - 15    Comment: Performed at [facility], [place] [address], [place], [place] [address]  Hemoglobin A1c     Status: Abnormal   Collection Time:  07/08/19  4:12 AM  Result Value Ref Range   Hgb A1c MFr Bld 9.5 (H) 4.8 - 5.6 %    Comment: (NOTE) Pre diabetes:          5.7%-6.4% Diabetes:              >6.4% Glycemic control for   <7.0% adults with diabetes    Mean Plasma Glucose 225.95 mg/dL    Comment: Performed at [place] [address], [place], [place] [address]  HIV Antibody (routine testing w rflx)     Status: None   Collection Time: 07/08/19  4:12 AM  Result Value Ref Range   HIV Screen 4th Generation wRfx NON REACTIVE NON REACTIVE    Comment: Performed at [place] [address], [place], [place] [address]  Procalcitonin - Baseline     Status: None   Collection Time: 07/08/19  4:12 AM  Result Value Ref Range   Procalcitonin 0.14 ng/mL    Comment:        Interpretation: PCT (Procalcitonin) <= 0.5 ng/mL: Systemic infection (sepsis) is not likely. Local bacterial infection is possible. (NOTE)       Sepsis PCT Algorithm           Lower Respiratory Tract  Infection PCT Algorithm    ----------------------------     ----------------------------         PCT < 0.25 ng/mL                PCT < 0.10 ng/mL         Strongly encourage             Strongly discourage   discontinuation of antibiotics    initiation of antibiotics    ----------------------------     -----------------------------       PCT 0.25 - 0.50 ng/mL            PCT 0.10 - 0.25 ng/mL               OR       >80% decrease in PCT            Discourage initiation of                                            antibiotics      Encourage discontinuation           of antibiotics    ----------------------------     -----------------------------         PCT >= 0.50 ng/mL              PCT 0.26 - 0.50 ng/mL               AND        <80% decrease in PCT             Encourage initiation of                                             antibiotics       Encourage continuation           of antibiotics     ----------------------------     -----------------------------        PCT >= 0.50 ng/mL                  PCT > 0.50 ng/mL               AND         increase in PCT                  Strongly encourage                                      initiation of antibiotics    Strongly encourage escalation           of antibiotics                                     -----------------------------                                           PCT <= 0.25 ng/mL  OR                                        > 80% decrease in PCT                                     Discontinue / Do not initiate                                             antibiotics Performed at [facility], [place] [address], [place], [place] [address]   CK     Status: None   Collection Time: 07/08/19  4:12 AM  Result Value Ref Range   Total CK 210 49 - 397 U/L    Comment: Performed at [facility], [place] [address], [place], [place] [address]  Culture, blood (routine x 2)     Status: None (Preliminary result)   Collection Time: 07/08/19  8:46 AM   Specimen: BLOOD LEFT HAND  Result Value Ref Range   Specimen Description BLOOD LEFT HAND    Special Requests      BOTTLES DRAWN AEROBIC ONLY Blood Culture results may not be optimal due to an inadequate volume of blood received in culture bottles   Culture      NO GROWTH 1 DAY Performed at [facility], [place] [address], [place], [place] [address]    Report Status PENDING   Culture, blood (routine x 2)     Status: None (Preliminary result)   Collection Time: 07/08/19  8:51 AM   Specimen: BLOOD RIGHT HAND  Result Value Ref Range   Specimen Description BLOOD RIGHT HAND    Special Requests      BOTTLES DRAWN AEROBIC AND ANAEROBIC Blood Culture adequate volume   Culture      NO GROWTH 1 DAY Performed at [facility], [place] [address] [place], [place] [address]    Report Status PENDING   Glucose, capillary     Status:  Abnormal   Collection Time: 07/08/19 11:23 AM  Result Value Ref Range   Glucose-Capillary 293 (H) 70 - 99 mg/dL   Comment 1 Notify RN    Comment 2 Document in Chart   AFP tumor marker     Status: None   Collection Time: 07/08/19  3:56 PM  Result Value Ref Range   AFP, Serum, Tumor Marker 2.5 0.0 - 8.3 ng/mL    Comment: (NOTE) Roche [employer]ostics Electrochemiluminescence Immunoassay (ECLIA) Values obtained with different assay methods or kits cannot be used interchangeably.  Results cannot be interpreted as absolute evidence of the presence or absence of malignant disease. This test is not interpretable in pregnant females. Performed At: [facility] [place], [place] [id number] Rush Farmer MD [id number]   Lactate dehydrogenase     Status: None   Collection Time: 07/08/19  3:56 PM  Result Value Ref Range   LDH 121 98 - 192 U/L    Comment: Performed at [facility], [place] [address], [place], [place] [address]  hCG, serum, qualitative     Status: None   Collection Time: 07/08/19  3:56 PM  Result Value Ref Range  Preg, Serum NEGATIVE NEGATIVE    Comment:        THE SENSITIVITY OF THIS METHODOLOGY IS >10 mIU/mL. Performed at [facility], [place] [address] [place], [place] [address]   Acetylcholine receptor, binding     Status: None   Collection Time: 07/08/19  3:56 PM  Result Value Ref Range   Acety choline binding ab 0.11 0.00 - 0.24 nmol/L    Comment: (NOTE)                               Negative:   0.00 - 0.24                               Borderline: 0.25 - 0.40                               Positive:         >0.40 Performed At: [facility] [place], [place] [id number] Rush Farmer MD [id number]   Glucose, capillary     Status: Abnormal   Collection Time: 07/08/19  4:20 PM  Result Value Ref Range   Glucose-Capillary 220 (H) 70 - 99 mg/dL   Comment 1 Notify RN    Comment 2 Document in Chart   Glucose,  capillary     Status: Abnormal   Collection Time: 07/08/19 10:00 PM  Result Value Ref Range   Glucose-Capillary 232 (H) 70 - 99 mg/dL  CBC     Status: Abnormal   Collection Time: 07/09/19  2:24 AM  Result Value Ref Range   WBC 17.5 (H) 4.0 - 10.5 K/uL   RBC 4.30 4.22 - 5.81 MIL/uL   Hemoglobin 12.6 (L) 13.0 - 17.0 g/dL   HCT 37.8 (L) 39.0 - 52.0 %   MCV 87.9 80.0 - 100.0 fL   MCH 29.3 26.0 - 34.0 pg   MCHC 33.3 30.0 - 36.0 g/dL   RDW 12.8 11.5 - 15.5 %   Platelets 188 150 - 400 K/uL   nRBC 0.0 0.0 - 0.2 %    Comment: Performed at [facility], [place] [address], [place], [place] [id number]  Basic metabolic panel     Status: Abnormal   Collection Time: 07/09/19  2:24 AM  Result Value Ref Range   Sodium 133 (L) 135 - 145 mmol/L   Potassium 3.7 3.5 - 5.1 mmol/L   Chloride 98 98 - 111 mmol/L   CO2 24 22 - 32 mmol/L   Glucose, Bld 223 (H) 70 - 99 mg/dL   BUN 10 6 - 20 mg/dL   Creatinine, Ser 1.26 (H) 0.61 - 1.24 mg/dL   Calcium 8.7 (L) 8.9 - 10.3 mg/dL   GFR calc non Af Amer >60 >60 mL/min   GFR calc Af Amer >60 >60 mL/min   Anion gap 11 5 - 15    Comment: Performed at [facility], [place] [address] [place], [place] [address]  Glucose, capillary     Status: Abnormal   Collection Time: 07/09/19  6:23 AM  Result Value Ref Range   Glucose-Capillary 228 (H) 70 - 99 mg/dL  Glucose, capillary     Status: Abnormal   Collection Time: 07/09/19 12:09 PM  Result Value Ref Range   Glucose-Capillary 179 (H) 70 - 99 mg/dL  Glucose,  capillary     Status: Abnormal   Collection Time: 07/09/19  3:50 PM  Result Value Ref Range   Glucose-Capillary 232 (H) 70 - 99 mg/dL   Ct Chest W Contrast  Result Date: 07/07/2019 CLINICAL DATA:  Swelling redness of the left upper back. EXAM: CT CHEST WITH CONTRAST TECHNIQUE: Multidetector CT imaging of the chest was performed during intravenous contrast administration. CONTRAST:  8mL OMNIPAQUE IOHEXOL 300 MG/ML  SOLN COMPARISON:  None. FINDINGS:  Cardiovascular: No significant vascular findings. Normal heart size. No pericardial effusion. Mediastinum/Nodes: No enlarged mediastinal, hilar, or axillary lymph nodes. Thyroid gland, trachea, and esophagus demonstrate no significant findings. Numerous ground-glass and soft tissue nodules within the anterior mediastinal fat, at the anatomic location of the thymus. Lungs/Pleura: Lungs are clear. No pleural effusion or pneumothorax. Upper Abdomen: No acute abnormality. Musculoskeletal: No fracture is seen. In correlation to the area of clinical concern, there is skin thickening and extensive subcutaneous fat stranding, to the level of the outer muscular fascia in the right upper back. No drainable fluid collection is seen. The skin thickening extends into the inferior back. Gynecomastia. IMPRESSION: 1. Numerous ground-glass and soft tissue nodules within the anterior mediastinal fat, at the anatomic location of the thymus. This may represent residual thymus or thymoma. Please correlate clinically. Close follow-up or tissue diagnosis may be considered. 2. In correlation to the area of clinical concern, there is skin thickening and extensive subcutaneous fat stranding, to the level of the outer muscular fascia in the right upper back. No drainable fluid collection is seen. The skin thickening extends into the inferior back. Electronically Signed   By: Fidela Salisbury M.D.   On: 07/07/2019 19:25   [place] Chest Soft Tissue  Result Date: 07/09/2019 CLINICAL DATA:  Soft tissue abscess of the mid upper back. EXAM: ULTRASOUND OF HEAD/NECK SOFT TISSUES TECHNIQUE: Ultrasound examination of the head and neck soft tissues was performed in the area of clinical concern. COMPARISON:  None. FINDINGS: There is a well-defined heterogeneous 2.0 x 1.5 x 1.5 cm mass in the soft tissues in the area of concern. The abnormality has a 5 mm long tail which extends to the skin surface. No appreciable perfusion in the mass. IMPRESSION:  Ultrasound exam is consistent with a subcutaneous abscess, as described. Electronically Signed   By: Lorriane Shire M.D.   On: 07/09/2019 13:53      Assessment/Plan DM HTN HLD OSA, wears CPAP Morbid obesity Mediastinal mass - work up as outpatient per TCTS  Large Back abscess The patient has a large back abscess measuring in its entirety around 20x15cm.  He will need to go to the OR tomorrow to have this debrided.  We discussed that this will likely be a very large wound and the possibility of needing to return to the OR for further debridement.  We discussed plans of care following the procedure such as WD dressing changes and hopefully eventually VAC placement.  He will be NPO p MN with plans for debridement in OR tomorrow.  Continue IV abx therapy.  Will try to get some wound cultures in the OR tomorrow.  Discussed procedure with the patient and his daughter who is at the bedside.  They are agreeable to proceed.  FEN - NPO p MN VTE - none ID - vancomycin  Henreitta Cea, [facility] Surgery 07/09/2019, 4:32 PM Please see Amion for pager number during day hours 7:00am-4:30pm

## 2019-07-10 ENCOUNTER — Inpatient Hospital Stay (HOSPITAL_COMMUNITY): Payer: Medicare Other | Admitting: Certified Registered Nurse Anesthetist

## 2019-07-10 ENCOUNTER — Encounter (HOSPITAL_COMMUNITY): Payer: Self-pay

## 2019-07-10 ENCOUNTER — Encounter (HOSPITAL_COMMUNITY)
Admission: EM | Disposition: A | Discharge status: Home Health | Payer: Self-pay | Source: Home / Self Care | Attending: Family Medicine

## 2019-07-10 HISTORY — PX: INCISION AND DRAINAGE ABSCESS: SHX5864

## 2019-07-10 LAB — CBC
HCT: 36.5 % — ABNORMAL LOW (ref 39.0–52.0)
Hemoglobin: 11.9 g/dL — ABNORMAL LOW (ref 13.0–17.0)
MCH: 28.7 pg (ref 26.0–34.0)
MCHC: 32.6 g/dL (ref 30.0–36.0)
MCV: 88.2 fL (ref 80.0–100.0)
Platelets: 219 10*3/uL (ref 150–400)
RBC: 4.14 MIL/uL — ABNORMAL LOW (ref 4.22–5.81)
RDW: 12.9 % (ref 11.5–15.5)
WBC: 20.2 10*3/uL — ABNORMAL HIGH (ref 4.0–10.5)
nRBC: 0 % (ref 0.0–0.2)

## 2019-07-10 LAB — COMPREHENSIVE METABOLIC PANEL
ALT: 21 U/L (ref 0–44)
AST: 19 U/L (ref 15–41)
Albumin: 2.1 g/dL — ABNORMAL LOW (ref 3.5–5.0)
Alkaline Phosphatase: 78 U/L (ref 38–126)
Anion gap: 10 (ref 5–15)
BUN: 16 mg/dL (ref 6–20)
CO2: 20 mmol/L — ABNORMAL LOW (ref 22–32)
Calcium: 8.1 mg/dL — ABNORMAL LOW (ref 8.9–10.3)
Chloride: 102 mmol/L (ref 98–111)
Creatinine, Ser: 1.52 mg/dL — ABNORMAL HIGH (ref 0.61–1.24)
GFR calc Af Amer: >60 mL/min (ref >60)
GFR calc non Af Amer: 53 mL/min — ABNORMAL LOW (ref >60)
Glucose, Bld: 378 mg/dL — ABNORMAL HIGH (ref 70–99)
Potassium: 4.2 mmol/L (ref 3.5–5.1)
Sodium: 132 mmol/L — ABNORMAL LOW (ref 135–145)
Total Bilirubin: 0.6 mg/dL (ref 0.3–1.2)
Total Protein: 6.6 g/dL (ref 6.5–8.1)

## 2019-07-10 LAB — SURGICAL PCR SCREEN
MRSA, PCR: NEGATIVE
Staphylococcus aureus: NEGATIVE

## 2019-07-10 LAB — GLUCOSE, CAPILLARY
Glucose-Capillary: 231 mg/dL — ABNORMAL HIGH (ref 70–99)
Glucose-Capillary: 187 mg/dL — ABNORMAL HIGH (ref 70–99)
Glucose-Capillary: 124 mg/dL — ABNORMAL HIGH (ref 70–99)
Glucose-Capillary: 144 mg/dL — ABNORMAL HIGH (ref 70–99)
Glucose-Capillary: 457 mg/dL — ABNORMAL HIGH (ref 70–99)

## 2019-07-10 LAB — BASIC METABOLIC PANEL
Anion gap: 8 (ref 5–15)
BUN: 10 mg/dL (ref 6–20)
CO2: 22 mmol/L (ref 22–32)
Calcium: 8.3 mg/dL — ABNORMAL LOW (ref 8.9–10.3)
Chloride: 102 mmol/L (ref 98–111)
Creatinine, Ser: 1.44 mg/dL — ABNORMAL HIGH (ref 0.61–1.24)
GFR calc Af Amer: >60 mL/min (ref >60)
GFR calc non Af Amer: 57 mL/min — ABNORMAL LOW (ref >60)
Glucose, Bld: 245 mg/dL — ABNORMAL HIGH (ref 70–99)
Potassium: 3.7 mmol/L (ref 3.5–5.1)
Sodium: 132 mmol/L — ABNORMAL LOW (ref 135–145)

## 2019-07-10 SURGERY — INCISION AND DRAINAGE, ABSCESS
Anesthesia: General | Site: Back

## 2019-07-10 MED ORDER — LIDOCAINE 2% (20 MG/ML) 5 ML SYRINGE
INTRAMUSCULAR | Status: AC
  Filled 2019-07-10: qty 5

## 2019-07-10 MED ORDER — LACTATED RINGERS IV SOLN
INTRAVENOUS | Status: DC
  Administered 2019-07-10: 14:00:00 via INTRAVENOUS

## 2019-07-10 MED ORDER — ONDANSETRON HCL 4 MG/2ML IJ SOLN
INTRAMUSCULAR | Status: AC
  Filled 2019-07-10: qty 2

## 2019-07-10 MED ORDER — LIVING WELL WITH DIABETES BOOK
Freq: Once | Status: AC
  Administered 2019-07-10: 18:00:00
  Filled 2019-07-10: qty 1

## 2019-07-10 MED ORDER — FENTANYL CITRATE (PF) 100 MCG/2ML IJ SOLN
INTRAMUSCULAR | Status: DC | PRN
  Administered 2019-07-10: 150 ug via INTRAVENOUS

## 2019-07-10 MED ORDER — PROPOFOL 10 MG/ML IV BOLUS
INTRAVENOUS | Status: AC
  Filled 2019-07-10: qty 20

## 2019-07-10 MED ORDER — INSULIN STARTER KIT- PEN NEEDLES (ENGLISH)
1.0000 | Freq: Once | Status: AC
  Administered 2019-07-10: 18:00:00 1
  Filled 2019-07-10: qty 1

## 2019-07-10 MED ORDER — SODIUM CHLORIDE 0.9 % IV SOLN
INTRAVENOUS | Status: DC
  Administered 2019-07-10 – 2019-07-11 (×2): via INTRAVENOUS

## 2019-07-10 MED ORDER — VANCOMYCIN HCL 10 G IV SOLR
1500.0000 mg | Freq: Two times a day (BID) | INTRAVENOUS | Status: DC
  Administered 2019-07-10 – 2019-07-12 (×4): 1500 mg via INTRAVENOUS
  Filled 2019-07-10 (×5): qty 1500

## 2019-07-10 MED ORDER — HYDROMORPHONE HCL 1 MG/ML IJ SOLN
INTRAMUSCULAR | Status: AC
  Administered 2019-07-10: 0.25 mg via INTRAVENOUS
  Filled 2019-07-10: qty 1

## 2019-07-10 MED ORDER — HYDROMORPHONE HCL 1 MG/ML IJ SOLN
0.2500 mg | INTRAMUSCULAR | Status: DC | PRN
  Administered 2019-07-10 (×2): 0.25 mg via INTRAVENOUS

## 2019-07-10 MED ORDER — SUGAMMADEX SODIUM 500 MG/5ML IV SOLN
INTRAVENOUS | Status: AC
  Filled 2019-07-10: qty 5

## 2019-07-10 MED ORDER — MIDAZOLAM HCL 5 MG/5ML IJ SOLN
INTRAMUSCULAR | Status: DC | PRN
  Administered 2019-07-10: 2 mg via INTRAVENOUS

## 2019-07-10 MED ORDER — PHENYLEPHRINE 40 MCG/ML (10ML) SYRINGE FOR IV PUSH (FOR BLOOD PRESSURE SUPPORT)
PREFILLED_SYRINGE | INTRAVENOUS | Status: DC | PRN
  Administered 2019-07-10 (×3): 80 ug via INTRAVENOUS

## 2019-07-10 MED ORDER — SUGAMMADEX SODIUM 500 MG/5ML IV SOLN
INTRAVENOUS | Status: DC | PRN
  Administered 2019-07-10 (×2): 200 mg via INTRAVENOUS

## 2019-07-10 MED ORDER — ROCURONIUM BROMIDE 50 MG/5ML IV SOSY
PREFILLED_SYRINGE | INTRAVENOUS | Status: DC | PRN
  Administered 2019-07-10: 50 mg via INTRAVENOUS

## 2019-07-10 MED ORDER — 0.9 % SODIUM CHLORIDE (POUR BTL) OPTIME
TOPICAL | Status: DC | PRN
  Administered 2019-07-10: 15:00:00 1000 mL

## 2019-07-10 MED ORDER — SUCCINYLCHOLINE CHLORIDE 20 MG/ML IJ SOLN
INTRAMUSCULAR | Status: DC | PRN
  Administered 2019-07-10: 140 mg via INTRAVENOUS

## 2019-07-10 MED ORDER — MIDAZOLAM HCL 2 MG/2ML IJ SOLN
INTRAMUSCULAR | Status: AC
  Filled 2019-07-10: qty 2

## 2019-07-10 MED ORDER — ONDANSETRON HCL 4 MG/2ML IJ SOLN
INTRAMUSCULAR | Status: DC | PRN
  Administered 2019-07-10: 4 mg via INTRAVENOUS

## 2019-07-10 MED ORDER — FENTANYL CITRATE (PF) 250 MCG/5ML IJ SOLN
INTRAMUSCULAR | Status: AC
  Filled 2019-07-10: qty 5

## 2019-07-10 MED ORDER — INSULIN ASPART 100 UNIT/ML ~~LOC~~ SOLN
12.0000 [IU] | Freq: Once | SUBCUTANEOUS | Status: AC
  Administered 2019-07-10: 12 [IU] via SUBCUTANEOUS

## 2019-07-10 MED ORDER — DEXAMETHASONE SODIUM PHOSPHATE 10 MG/ML IJ SOLN
INTRAMUSCULAR | Status: AC
  Filled 2019-07-10: qty 1

## 2019-07-10 MED ORDER — LIDOCAINE 2% (20 MG/ML) 5 ML SYRINGE
INTRAMUSCULAR | Status: DC | PRN
  Administered 2019-07-10: 100 mg via INTRAVENOUS

## 2019-07-10 MED ORDER — ROCURONIUM BROMIDE 10 MG/ML (PF) SYRINGE
PREFILLED_SYRINGE | INTRAVENOUS | Status: AC
  Filled 2019-07-10: qty 10

## 2019-07-10 MED ORDER — PROPOFOL 10 MG/ML IV BOLUS
INTRAVENOUS | Status: DC | PRN
  Administered 2019-07-10: 200 mg via INTRAVENOUS

## 2019-07-10 MED ORDER — MUPIROCIN 2 % EX OINT
1.0000 "application " | TOPICAL_OINTMENT | Freq: Two times a day (BID) | CUTANEOUS | Status: DC
  Administered 2019-07-10 – 2019-07-12 (×4): 1 via NASAL
  Filled 2019-07-10: qty 22

## 2019-07-10 MED ORDER — DEXAMETHASONE SODIUM PHOSPHATE 10 MG/ML IJ SOLN
INTRAMUSCULAR | Status: DC | PRN
  Administered 2019-07-10: 5 mg via INTRAVENOUS

## 2019-07-10 MED ORDER — BUPIVACAINE-EPINEPHRINE 0.25% -1:200000 IJ SOLN
INTRAMUSCULAR | Status: AC
  Filled 2019-07-10: qty 1

## 2019-07-10 SURGICAL SUPPLY — 29 items
BLADE CLIPPER SURG (BLADE) IMPLANT
BNDG GAUZE ELAST 4 BULKY (GAUZE/BANDAGES/DRESSINGS) ×2 IMPLANT
CANISTER SUCT 3000ML PPV (MISCELLANEOUS) ×2 IMPLANT
CHLORAPREP W/TINT 26 (MISCELLANEOUS) IMPLANT
COVER SURGICAL LIGHT HANDLE (MISCELLANEOUS) ×2 IMPLANT
COVER WAND RF STERILE (DRAPES) ×2 IMPLANT
DRAPE LAPAROSCOPIC ABDOMINAL (DRAPES) ×2 IMPLANT
DRAPE LAPAROTOMY 100X72 PEDS (DRAPES) IMPLANT
DRSG PAD ABDOMINAL 8X10 ST (GAUZE/BANDAGES/DRESSINGS) ×2 IMPLANT
ELECT CAUTERY BLADE 6.4 (BLADE) IMPLANT
ELECT REM PT RETURN 9FT ADLT (ELECTROSURGICAL) ×2
ELECTRODE REM PT RTRN 9FT ADLT (ELECTROSURGICAL) ×1 IMPLANT
GAUZE SPONGE 4X4 12PLY STRL (GAUZE/BANDAGES/DRESSINGS) IMPLANT
GLOVE BIO SURGEON STRL SZ7 (GLOVE) ×2 IMPLANT
GLOVE BIOGEL PI IND STRL 7.5 (GLOVE) ×1 IMPLANT
GLOVE BIOGEL PI INDICATOR 7.5 (GLOVE) ×1
GOWN STRL REUS W/ TWL LRG LVL3 (GOWN DISPOSABLE) ×2 IMPLANT
GOWN STRL REUS W/TWL LRG LVL3 (GOWN DISPOSABLE) ×2
KIT BASIN OR (CUSTOM PROCEDURE TRAY) ×2 IMPLANT
KIT TURNOVER KIT B (KITS) ×2 IMPLANT
NS IRRIG 1000ML POUR BTL (IV SOLUTION) ×2 IMPLANT
PACK GENERAL/GYN (CUSTOM PROCEDURE TRAY) ×2 IMPLANT
PAD ARMBOARD 7.5X6 YLW CONV (MISCELLANEOUS) ×2 IMPLANT
PENCIL SMOKE EVACUATOR (MISCELLANEOUS) ×2 IMPLANT
SWAB COLLECTION DEVICE MRSA (MISCELLANEOUS) ×2 IMPLANT
SWAB CULTURE ESWAB REG 1ML (MISCELLANEOUS) IMPLANT
TAPE CLOTH SURG 6X10 WHT LF (GAUZE/BANDAGES/DRESSINGS) ×2 IMPLANT
TOWEL GREEN STERILE (TOWEL DISPOSABLE) ×2 IMPLANT
TOWEL GREEN STERILE FF (TOWEL DISPOSABLE) ×2 IMPLANT

## 2019-07-10 NOTE — Care Management Important Message (Signed)
Important Message  Patient Details  Name: [name] MRN: [id number] Date of Birth: 31-Jan-1971   Medicare Important Message Given:  Yes     [name] 07/10/2019, 2:28 PM

## 2019-07-10 NOTE — Anesthesia Preprocedure Evaluation (Addendum)
Anesthesia Evaluation  Patient identified by MRN, date of birth, ID band Patient awake    Reviewed: Allergy & Precautions, H&P , NPO status , Patient's Chart, lab work & pertinent test results  Airway Mallampati: III  TM Distance: >3 FB Neck ROM: Full    Dental no notable dental hx. (+) Teeth Intact, Dental Advisory Given   Pulmonary sleep apnea and Continuous Positive Airway Pressure Ventilation , former smoker,    Pulmonary exam normal breath sounds clear to auscultation       Cardiovascular hypertension,  Rhythm:Regular Rate:Normal     Neuro/Psych Bipolar Disorder Schizophrenia negative neurological ROS     GI/Hepatic negative GI ROS, Neg liver ROS,   Endo/Other  diabetes, Type 2, Oral Hypoglycemic AgentsMorbid obesity  Renal/GU negative Renal ROS  negative genitourinary   Musculoskeletal   Abdominal   Peds  Hematology negative hematology ROS (+)   Anesthesia Other Findings   Reproductive/Obstetrics negative OB ROS                           Anesthesia Physical Anesthesia Plan  ASA: III  Anesthesia Plan: General   Post-op Pain Management:    Induction: Intravenous  PONV Risk Score and Plan: 3 and Ondansetron, Midazolam and Treatment may vary due to age or medical condition  Airway Management Planned: Oral ETT and Video Laryngoscope Planned  Additional Equipment:   Intra-op Plan:   Post-operative Plan: Extubation in OR  Informed Consent: I have reviewed the patients History and Physical, chart, labs and discussed the procedure including the risks, benefits and alternatives for the proposed anesthesia with the patient or authorized representative who has indicated his/her understanding and acceptance.     Dental advisory given  Plan Discussed with: CRNA  Anesthesia Plan Comments:        Anesthesia Quick Evaluation

## 2019-07-10 NOTE — Anesthesia Procedure Notes (Signed)
Procedure Name: Intubation Date/Time: 07/10/2019 2:43 PM Performed by: Trinna Post., CRNA Pre-anesthesia Checklist: Patient identified, Emergency Drugs available, Suction available, Patient being monitored and Timeout performed Patient Re-evaluated:Patient Re-evaluated prior to induction Oxygen Delivery Method: Circle system utilized Preoxygenation: Pre-oxygenation with 100% oxygen Induction Type: IV induction Ventilation: Mask ventilation without difficulty Laryngoscope Size: Glidescope and 4 Grade View: Grade I Tube type: Oral Tube size: 7.5 mm Number of attempts: 1 Airway Equipment and Method: Video-laryngoscopy and Rigid stylet Placement Confirmation: ETT inserted through vocal cords under direct vision,  positive ETCO2 and breath sounds checked- equal and bilateral Secured at: 22 cm Tube secured with: Tape Dental Injury: Teeth and Oropharynx as per pre-operative assessment

## 2019-07-10 NOTE — Progress Notes (Signed)
Subjective/Chief Complaint: Patient resting comfortably    Objective: Vital signs in last 24 hours: Temp:  [98.4 F (36.9 C)-102 F (38.9 C)] 98.4 F (36.9 C) (10/14 0446) Pulse Rate:  [81-88] 88 (10/14 0446) Resp:  [14-23] 21 (10/14 0446) BP: (106-134)/(59-80) 110/67 (10/14 0446) SpO2:  [93 %-97 %] 97 % (10/14 0446) Weight:  [175.3 kg] 175.3 kg (10/14 0446) Last BM Date: 07/09/19  Intake/Output from previous day: 10/13 0701 - 10/14 0700 In: 1093.2 [P.O.:100; IV Piggyback:993.2] Out: 750 [Urine:750] Intake/Output this shift: No intake/output data recorded.  Back abscess draining purulent material - surrounding induration/ erythema  Lab Results:  Recent Labs    07/09/19 0224 07/10/19 0405  WBC 17.5* 20.2*  HGB 12.6* 11.9*  HCT 37.8* 36.5*  PLT 188 219   BMET Recent Labs    07/09/19 0224 07/10/19 0405  NA 133* 132*  K 3.7 3.7  CL 98 102  CO2 24 22  GLUCOSE 223* 245*  BUN 10 10  CREATININE 1.26* 1.44*  CALCIUM 8.7* 8.3*   PT/INR No results for input(s): LABPROT, INR in the last 72 hours. ABG No results for input(s): PHART, HCO3 in the last 72 hours.  Invalid input(s): PCO2, PO2  Studies/Results: [place] Chest Soft Tissue  Result Date: 07/09/2019 CLINICAL DATA:  Soft tissue abscess of the mid upper back. EXAM: ULTRASOUND OF HEAD/NECK SOFT TISSUES TECHNIQUE: Ultrasound examination of the head and neck soft tissues was performed in the area of clinical concern. COMPARISON:  None. FINDINGS: There is a well-defined heterogeneous 2.0 x 1.5 x 1.5 cm mass in the soft tissues in the area of concern. The abnormality has a 5 mm long tail which extends to the skin surface. No appreciable perfusion in the mass. IMPRESSION: Ultrasound exam is consistent with a subcutaneous abscess, as described. Electronically Signed   By: Lorriane Shire M.D.   On: 07/09/2019 13:53    Anti-infectives: Anti-infectives (From admission, onward)   Start     Dose/Rate Route Frequency  Ordered Stop   07/09/19 2330  vancomycin (VANCOCIN) 1,750 mg in sodium chloride 0.9 % 500 mL IVPB     1,750 mg 250 mL/hr over 120 Minutes Intravenous Every 12 hours 07/09/19 1033     07/09/19 1130  vancomycin (VANCOCIN) 2,500 mg in sodium chloride 0.9 % 500 mL IVPB     2,500 mg 250 mL/hr over 120 Minutes Intravenous  Once 07/09/19 1033 07/09/19 1405   07/08/19 1400  clindamycin (CLEOCIN) IVPB 600 mg  Status:  Discontinued     600 mg 100 mL/hr over 30 Minutes Intravenous Every 8 hours 07/08/19 1016 07/09/19 1003   07/08/19 1100  vancomycin (VANCOCIN) 1,750 mg in sodium chloride 0.9 % 500 mL IVPB  Status:  Discontinued     1,750 mg 250 mL/hr over 120 Minutes Intravenous Every 12 hours 07/07/19 2037 07/08/19 1016   07/08/19 0500  piperacillin-tazobactam (ZOSYN) IVPB 3.375 g  Status:  Discontinued     3.375 g 12.5 mL/hr over 240 Minutes Intravenous Every 8 hours 07/07/19 2032 07/08/19 1016   07/07/19 2045  piperacillin-tazobactam (ZOSYN) IVPB 3.375 g     3.375 g 100 mL/hr over 30 Minutes Intravenous  Once 07/07/19 2032 07/07/19 2211   07/07/19 2045  vancomycin (VANCOCIN) 2,500 mg in sodium chloride 0.9 % 500 mL IVPB     2,500 mg 250 mL/hr over 120 Minutes Intravenous  Once 07/07/19 2032 07/08/19 0104   07/07/19 1715  clindamycin (CLEOCIN) IVPB 600 mg     600  mg 100 mL/hr over 30 Minutes Intravenous  Once 07/07/19 1711 07/07/19 1800      Assessment/Plan: Plan incision and drainage of back abscess today.  The surgical procedure has been discussed with the patient.  Potential risks, benefits, alternative treatments, and expected outcomes have been explained.  All of the patient's questions at this time have been answered.  The likelihood of reaching the patient's treatment goal is good.  The patient understand the proposed surgical procedure and wishes to proceed.   LOS: 3 days    Maia Petties 07/10/2019

## 2019-07-10 NOTE — Op Note (Signed)
Preop diagnosis: Large subcutaneous abscess of the back Postop diagnosis: Large subcutaneous abscess of the back (12 x 12 x 8 cm deep) Procedure performed: Sharp incision and excisional debridement of subcutaneous abscess of the back (skin, subcutaneous tissue, fascia) Surgeon:Aliviyah Malanga K Milca Sytsma Anesthesia: General endotracheal Indications: This is a 48 year old male who is morbidly obese with a BMI of 49 who presents with a two-week history of pain in his upper back that has become swollen with purulent drainage.  We were asked to examine the patient.  We felt that he had a subcutaneous abscess that was not visible on CT scan.  He presents now for debridement.  Description of procedure: The patient was brought to the operating room and placed in a supine position on that is stretcher.  After adequate level of general anesthesia was obtained, the patient was moved to a prone position on the operating room table.  Appropriate padding and straps were used to secure the patient.  We prepped the upper back with Betadine and draped in sterile fashion.  Timeout was taken to ensure the proper patient and proper procedure.  The patient has a 6 cm wide area that shows multiple pustules.  There is cellulitis around this area.  Using cautery I made a round hole in the center of this area.  We dissected down through a very thick layer of dermis into the subcutaneous tissues were encountered several large abscesses.  We debrided the walls of all these abscesses.  We debrided down to the underlying fascia.  We had to enlarge her incision several times to debride all of the infected tissue.  No more purulence was noted.  We irrigated the wound thoroughly and inspected for hemostasis.  A roll of Kerlix was moistened with saline and we packed the entire roll into this wound.  A clean dressing was applied.  The patient was then moved back to a supine position.  He was extubated and brought to the recovery room in stable  condition.  All sponge, instrument, and needle counts are correct.  Imogene Burn. Georgette Dover, MD, [facility]  General/ Trauma Surgery   07/10/2019 4:00 PM

## 2019-07-10 NOTE — Progress Notes (Signed)
Inpatient Diabetes Program Recommendations  AACE/ADA: New Consensus Statement on Inpatient Glycemic Control (2015)  Target Ranges:  Prepandial:   less than 140 mg/dL      Peak postprandial:   less than 180 mg/dL (1-2 hours)      Critically ill patients:  140 - 180 mg/dL   Lab Results  Component Value Date   GLUCAP 231 (H) 07/10/2019   HGBA1C 9.5 (H) 07/08/2019    Review of Glycemic Control  Diabetes history: DM2 Outpatient Diabetes medications: glipizide 5 mg QAM Current orders for Inpatient glycemic control: Lantus 25 units QD, Novolog 0-20 units tidwc and 0-5 units QHS + 4 units tidwc  HgbA1C - 9.5% New to insulin, prefers insulin pens. 10/14 - 235, 241 mg/dL. NPO for surgery this am FBS > 180 mg/dL, needs insulin adjustment  Inpatient Diabetes Program Recommendations:     Increase Lantus to 30 units QD Increase Novolog to 6 units tidwc for meal cov insulin if pt eats > 50% meal.  Demonstrated insulin pen use with pt. Pt did not want to do teach back since getting ready for surgery. Discussed importance of good glycemic control and states he is willing to go home on insulin if needed.   Spoke with Dr. Loleta Books regarding GLP-1 being his preference at discharge if insurance will cover.   Order insulin pen starter kit, Living Well book and care manager consult.  Will f/u on 10/15.  Thank you. Lorenda Peck, RD, LDN, CDE Inpatient Diabetes Coordinator [phone]

## 2019-07-10 NOTE — Transfer of Care (Signed)
Immediate Anesthesia Transfer of Care Note  Patient: [name]  Procedure(s) Performed: INCISION AND DRAINAGE OF BACK  ABSCESS (N/A Back)  Patient Location: PACU  Anesthesia Type:General  Level of Consciousness: awake, alert  and oriented  Airway & Oxygen Therapy: Patient Spontanous Breathing and Patient connected to face mask oxygen  Post-op Assessment: Report given to RN and Post -op Vital signs reviewed and stable  Post vital signs: Reviewed and stable  Last Vitals:  Vitals Value Taken Time  BP 103/69 07/10/19 1545  Temp 36.1 C 07/10/19 1545  Pulse 88 07/10/19 1546  Resp 25 07/10/19 1546  SpO2 92 % 07/10/19 1546  Vitals shown include unvalidated device data.  Last Pain:  Vitals:   07/10/19 1545  TempSrc:   PainSc: Asleep         Complications: No apparent anesthesia complications

## 2019-07-10 NOTE — Progress Notes (Signed)
Another RT saw patient tonight. Patient stated he had everything done and did not need RTs help.

## 2019-07-10 NOTE — Plan of Care (Signed)
Plan of care progressing.

## 2019-07-10 NOTE — Progress Notes (Signed)
PROGRESS NOTE    [name]  ZOX:[id number] DOB: 11-08-1970 DOA: 07/07/2019 PCP: Rogers Blocker, MD      Brief Narrative:  [name] is a 48 y.o. M with morbid obesity, OSA, schizophrenia, and HTN who presented with back pain and fever.   In the ER, CT showed extensive cellulitis of upper back.  Also thymoma.  Started on antibiotics and admitted to medicine.       Assessment & Plan:  Cellulitis with mild sepsis Fever and leukocytocisis without end organ damage or elevated lactic acid on admission.   Again fever to 102F yesterday.  Spot slightly larger, now fluctuant, [place] yesterday showed fluid collection.  Gen Surg to OR today. -Continue vancomycin -Consult General Surgery, appreciate attentions    AKI Admitted with Cr 1.1 --> 1.2 --> 1.44 today.  ARB stopped yesterday.  Has had some intermittent hypotension at night with fever. -Monitor UOP -Start IV fluids -Continue to hold ARB -Avoid hypotension. -Close monitoring Cr daily while on Vanc   Possible thymoma CT reports incidental finding of "numerous ground glass and soft tissue nodules at location of thymus", possible thymoma or residual thymus. -Consulted CT surgery, appreciate expertise --> needs outpatient follow up with Dr. Kipp Brood for follow up labs, as well as ordering MRI chest and/or scrotal ultrasound   Hypertension BP stable this AM -Hold irbesartan  Diabetes Glucoses elevated still.  Reports he has always been told pre-diabetes, but my doctor "just wants to treat it".  A1c 9.5%. -Continue Lantus  -Adjust dose insulin if still hyperglycemic post NPO for surgery -Continue SSI -Continue mealtime insulin  -Continue Crestor -Hold glipizide  Schizophrenia This is a chart history from 2012.   Not on meds.  Other medications -Continue  Flomax   Hyponatremia Mild, stable -Monitor BMP       MDM and disposition: The below labs and imaging reports reviewed and summarized  above.  Medication management as above.   The patient was admitted with back abscess. He was febrile and hypotensive overnight despite antibiotics.  Will need I&D today, Gen Surg to follow and determine if needs further I&D.   WIll continue IV vancomycin, still febrile, needing operative debridement, several days more admission likely           DVT prophylaxis: Lovenox Code Status: FULL Family Communication:     Consultants:   CT surgery  General Surgery  Procedures:   10/11 CT chest -- no abscess  10/13 [place] back -- 2x4 abscess  10/14 operative I&D  Antimicrobials:   Vancomycin 10/11 >>  Clindamycin x1 day  Zosyn x1 day    Subjective: Back pain no change.  Still fever last night again.  No vomiting, confusion, chest pain, dyspnea, diplopia, lid lag.  Objective: Vitals:   07/10/19 1700 07/10/19 1715 07/10/19 1725 07/10/19 1730  BP:   132/63   Pulse: 82 74 73 69  Resp: 20 18 17 13   Temp: 99.4 F (37.4 C)     TempSrc:      SpO2: 98% 90% 92% 90%  Weight:      Height:        Intake/Output Summary (Last 24 hours) at 07/10/2019 1741 Last data filed at 07/10/2019 1700 Gross per 24 hour  Intake 1400 ml  Output 1010 ml  Net 390 ml   Filed Weights   07/09/19 0500 07/10/19 0446 07/10/19 1359  Weight: (!) 175.3 kg (!) 175.3 kg (!) 175.3 kg    Examination: General appearance: Obese adult male,  lying in bed, appears little sweaty, no acute distress. HEENT: Anicteric, conjunctival pink, lids and lashes normal.  No nasal deformity, discharge, or epistaxis.  Lips moist, dentition normal, oropharynx moist, no oral lesions, hearing normal. Skin: Warm and diaphoretic, no jaundice, no suspicious rashes or lesions, large area of redness, induration, and pustules on the back.  Slightly worse from yesterday Cardiac: RRR, no murmurs, JVP not visible due to body habitus, no lower extremity edema. Respiratory: Normal respiratory rate and rhythm, lungs clear without  rales or wheezes Abdomen: Abdomen soft without tenderness palpation, ascites or distention. MSK:    Neuro: Awake and alert, extraocular movements intact, moves all extremities normal strength and coordination, speech fluent.    Psych: Sensorium intact respiratory questions, attention normal, affect normal, judgment insight appear normal.    Data Reviewed: I have personally reviewed following labs and imaging studies:  CBC: Recent Labs  Lab 07/07/19 1600 07/08/19 0412 07/09/19 0224 07/10/19 0405  WBC 17.3* 16.2* 17.5* 20.2*  NEUTROABS 13.9* 13.4*  --   --   HGB 13.4 12.5* 12.6* 11.9*  HCT 41.5 38.0* 37.8* 36.5*  MCV 88.5 87.2 87.9 88.2  PLT 238 212 188 357   Basic Metabolic Panel: Recent Labs  Lab 07/07/19 1600 07/08/19 0412 07/09/19 0224 07/10/19 0405  NA 130* 132* 133* 132*  K 4.1 4.0 3.7 3.7  CL 98 100 98 102  CO2 23 21* 24 22  GLUCOSE 268* 292* 223* 245*  BUN 13 12 10 10   CREATININE 1.22 1.14 1.26* 1.44*  CALCIUM 9.0 8.7* 8.7* 8.3*   GFR: Estimated Creatinine Clearance: 106 mL/min (A) (by C-G formula based on SCr of 1.44 mg/dL (H)). Liver Function Tests: Recent Labs  Lab 07/07/19 1600 07/08/19 0412  AST 14* 13*  ALT 17 18  ALKPHOS 99 92  BILITOT 1.4* 1.1  PROT 8.1 7.3  ALBUMIN 3.0* 2.7*   No results for input(s): LIPASE, AMYLASE in the last 168 hours. No results for input(s): AMMONIA in the last 168 hours. Coagulation Profile: No results for input(s): INR, PROTIME in the last 168 hours. Cardiac Enzymes: Recent Labs  Lab 07/08/19 0412  CKTOTAL 210   BNP (last 3 results) No results for input(s): PROBNP in the last 8760 hours. HbA1C: Recent Labs    07/08/19 0412  HGBA1C 9.5*   CBG: Recent Labs  Lab 07/09/19 2112 07/10/19 0603 07/10/19 1048 07/10/19 1356 07/10/19 1543  GLUCAP 274* 231* 187* 124* 144*   Lipid Profile: No results for input(s): CHOL, HDL, LDLCALC, TRIG, CHOLHDL, LDLDIRECT in the last 72 hours. Thyroid Function Tests: No  results for input(s): TSH, T4TOTAL, FREET4, T3FREE, THYROIDAB in the last 72 hours. Anemia Panel: No results for input(s): VITAMINB12, FOLATE, FERRITIN, TIBC, IRON, RETICCTPCT in the last 72 hours. Urine analysis:    Component Value Date/Time   COLORURINE YELLOW 03/22/2014 0401   APPEARANCEUR CLEAR 03/22/2014 0401   LABSPEC 1.025 03/22/2014 0401   PHURINE 5.5 03/22/2014 0401   GLUCOSEU NEGATIVE 03/22/2014 0401   HGBUR NEGATIVE 03/22/2014 0401   HGBUR small 10/17/2008 0000   BILIRUBINUR neg 08/11/2015 1652   KETONESUR NEGATIVE 03/22/2014 0401   PROTEINUR 3+ 08/11/2015 1652   PROTEINUR >300 (A) 03/22/2014 0401   UROBILINOGEN 0.2 08/11/2015 1652   UROBILINOGEN 0.2 03/22/2014 0401   NITRITE neg 08/11/2015 1652   NITRITE NEGATIVE 03/22/2014 0401   LEUKOCYTESUR Negative 08/11/2015 1652   Sepsis Labs: @LABRCNTIP (procalcitonin:4,lacticacidven:4)  ) Recent Results (from the past 240 hour(s))  SARS CORONAVIRUS 2 (TAT 6-24 HRS)  Nasopharyngeal Nasopharyngeal Swab     Status: None   Collection Time: 07/07/19  6:05 PM   Specimen: Nasopharyngeal Swab  Result Value Ref Range Status   SARS Coronavirus 2 NEGATIVE NEGATIVE Final    Comment: (NOTE) SARS-CoV-2 target nucleic acids are NOT DETECTED. The SARS-CoV-2 RNA is generally detectable in upper and lower respiratory specimens during the acute phase of infection. Negative results do not preclude SARS-CoV-2 infection, do not rule out co-infections with other pathogens, and should not be used as the sole basis for treatment or other patient management decisions. Negative results must be combined with clinical observations, patient history, and epidemiological information. The expected result is Negative. Fact Sheet for Patients: SugarRoll.be Fact Sheet for Healthcare Providers: https://www.woods-mathews.com/ This test is not yet approved or cleared by the [place] FDA and  has been authorized  for detection and/or diagnosis of SARS-CoV-2 by FDA under an Emergency Use Authorization (EUA). This EUA will remain  in effect (meaning this test can be used) for the duration of the COVID-19 declaration under Section 56 4(b)(1) of the Act, 21 U.S.C. section 360bbb-3(b)(1), unless the authorization is terminated or revoked sooner. Performed at [facility], [place] [address], [place], [place] [address]   Culture, blood (routine x 2)     Status: None (Preliminary result)   Collection Time: 07/08/19  8:46 AM   Specimen: BLOOD LEFT HAND  Result Value Ref Range Status   Specimen Description BLOOD LEFT HAND  Final   Special Requests   Final    BOTTLES DRAWN AEROBIC ONLY Blood Culture results may not be optimal due to an inadequate volume of blood received in culture bottles   Culture   Final    NO GROWTH 1 DAY Performed at [facility], [place] [address] [place], [place] [address]    Report Status PENDING  Incomplete  Culture, blood (routine x 2)     Status: None (Preliminary result)   Collection Time: 07/08/19  8:51 AM   Specimen: BLOOD RIGHT HAND  Result Value Ref Range Status   Specimen Description BLOOD RIGHT HAND  Final   Special Requests   Final    BOTTLES DRAWN AEROBIC AND ANAEROBIC Blood Culture adequate volume   Culture   Final    NO GROWTH 1 DAY Performed at [facility], [place] [address], [place], [place] [address]    Report Status PENDING  Incomplete  Surgical PCR screen     Status: None   Collection Time: 07/10/19  1:31 PM   Specimen: Nasal Mucosa; Nasal Swab  Result Value Ref Range Status   MRSA, PCR NEGATIVE NEGATIVE Final   Staphylococcus aureus NEGATIVE NEGATIVE Final    Comment: (NOTE) The Xpert SA Assay (FDA approved for NASAL specimens in patients 74 years of age and older), is one component of a comprehensive surveillance program. It is not intended to diagnose infection nor to guide or monitor treatment. Performed at [facility],  [place] [address] [place], [place] [address]          Radiology Studies: [place] Chest Soft Tissue  Result Date: 07/09/2019 CLINICAL DATA:  Soft tissue abscess of the mid upper back. EXAM: ULTRASOUND OF HEAD/NECK SOFT TISSUES TECHNIQUE: Ultrasound examination of the head and neck soft tissues was performed in the area of clinical concern. COMPARISON:  None. FINDINGS: There is a well-defined heterogeneous 2.0 x 1.5 x 1.5 cm mass in the soft tissues in the area of concern. The abnormality  has a 5 mm long tail which extends to the skin surface. No appreciable perfusion in the mass. IMPRESSION: Ultrasound exam is consistent with a subcutaneous abscess, as described. Electronically Signed   By: Lorriane Shire M.D.   On: 07/09/2019 13:53        Scheduled Meds: . [MAR Hold] enoxaparin (LOVENOX) injection  90 mg Subcutaneous Q24H  . [MAR Hold] insulin aspart  0-20 Units Subcutaneous TID WC  . [MAR Hold] insulin aspart  0-5 Units Subcutaneous QHS  . [MAR Hold] insulin aspart  4 Units Subcutaneous TID WC  . [MAR Hold] insulin glargine  25 Units Subcutaneous Daily  . [MAR Hold] insulin starter kit- pen needles  1 kit Other Once  . [MAR Hold] living well with diabetes book   Does not apply Once  . [MAR Hold] mupirocin ointment  1 application Nasal BID  . [MAR Hold] rosuvastatin  10 mg Oral Daily  . [MAR Hold] tamsulosin  0.4 mg Oral QHS   Continuous Infusions: . sodium chloride 150 mL/hr at 07/10/19 0807  . lactated ringers 10 mL/hr at 07/10/19 1416  . [MAR Hold] vancomycin 1,500 mg (07/10/19 1251)     LOS: 3 days    Time spent: 25 minutes    Edwin Dada, MD [facility] 07/10/2019, 5:41 PM     Please page through [place]:  [email or web address] Password [email or web address] If 7PM-7AM, please contact night-coverage

## 2019-07-10 NOTE — Anesthesia Postprocedure Evaluation (Signed)
Anesthesia Post Note  Patient: [name]  Procedure(s) Performed: INCISION AND DRAINAGE OF BACK  ABSCESS (N/A Back)     Patient location during evaluation: PACU Anesthesia Type: General Level of consciousness: awake and alert Pain management: pain level controlled Vital Signs Assessment: post-procedure vital signs reviewed and stable Respiratory status: spontaneous breathing, nonlabored ventilation and respiratory function stable Cardiovascular status: blood pressure returned to baseline and stable Postop Assessment: no apparent nausea or vomiting Anesthetic complications: no    Last Vitals:  Vitals:   07/10/19 1730 07/10/19 1756  BP:  (!) 104/50  Pulse: 69   Resp: 13   Temp: 37.4 C 37.1 C  SpO2: 90%     Last Pain:  Vitals:   07/10/19 1635  TempSrc:   PainSc: 7                  [name],W. EDMOND

## 2019-07-10 NOTE — Progress Notes (Signed)
Patient back to room from PACU. V/s and assessment complete. Will continue to monitor.  Emelda Fear, RN

## 2019-07-10 NOTE — Plan of Care (Signed)
Continue to monitor

## 2019-07-10 NOTE — TOC Benefit Eligibility Note (Signed)
Transition of Care [facility]) Benefit Eligibility Note    Patient Details  Name: [name] MRN: [id number] Date of Birth: 1971/09/04   Medication/Dose: TRULICITY  PEN  Covered?: Yes  Tier: 3 Drug  Prescription Coverage Preferred Pharmacy: Roseanne Kaufman with Person/Company/Phone Number:: [id number] @ [facility]   RX # [phone]  Co-Pay: $8.95  Prior Approval: No  Deductible: (LOWE INCOME SUBSIDY)  Additional Notes: LANTUS  PEN ,COVER-YES, CO-PAY- $8.95, TIER-3 DRUG, P/A-YES [phone] / NOVOLOG PEN : NON-PREFERRED / ALTERNATIVE : HUMALOG , COVER- YES, CO-PAY- $8.95, [facility] P/[facility] -Odetta Pink Phone Number: 07/10/2019, 4:52 PM

## 2019-07-10 NOTE — Progress Notes (Signed)
Report called to Tammy, RN in short stay

## 2019-07-11 ENCOUNTER — Encounter (HOSPITAL_COMMUNITY): Payer: Self-pay | Admitting: Surgery

## 2019-07-11 LAB — BASIC METABOLIC PANEL
Anion gap: 10 (ref 5–15)
BUN: 15 mg/dL (ref 6–20)
CO2: 21 mmol/L — ABNORMAL LOW (ref 22–32)
Calcium: 8.4 mg/dL — ABNORMAL LOW (ref 8.9–10.3)
Chloride: 105 mmol/L (ref 98–111)
Creatinine, Ser: 1.41 mg/dL — ABNORMAL HIGH (ref 0.61–1.24)
GFR calc Af Amer: >60 mL/min (ref >60)
GFR calc non Af Amer: 58 mL/min — ABNORMAL LOW (ref >60)
Glucose, Bld: 296 mg/dL — ABNORMAL HIGH (ref 70–99)
Potassium: 4.4 mmol/L (ref 3.5–5.1)
Sodium: 136 mmol/L (ref 135–145)

## 2019-07-11 LAB — GLUCOSE, CAPILLARY
Glucose-Capillary: 260 mg/dL — ABNORMAL HIGH (ref 70–99)
Glucose-Capillary: 242 mg/dL — ABNORMAL HIGH (ref 70–99)
Glucose-Capillary: 388 mg/dL — ABNORMAL HIGH (ref 70–99)
Glucose-Capillary: 285 mg/dL — ABNORMAL HIGH (ref 70–99)

## 2019-07-11 LAB — CBC
HCT: 35.8 % — ABNORMAL LOW (ref 39.0–52.0)
Hemoglobin: 11.6 g/dL — ABNORMAL LOW (ref 13.0–17.0)
MCH: 28.7 pg (ref 26.0–34.0)
MCHC: 32.4 g/dL (ref 30.0–36.0)
MCV: 88.6 fL (ref 80.0–100.0)
Platelets: 248 10*3/uL (ref 150–400)
RBC: 4.04 MIL/uL — ABNORMAL LOW (ref 4.22–5.81)
RDW: 12.9 % (ref 11.5–15.5)
WBC: 20.9 10*3/uL — ABNORMAL HIGH (ref 4.0–10.5)
nRBC: 0 % (ref 0.0–0.2)

## 2019-07-11 MED ORDER — SODIUM CHLORIDE 0.9 % IV SOLN
INTRAVENOUS | Status: DC
  Administered 2019-07-11 – 2019-07-13 (×4): via INTRAVENOUS
  Filled 2019-07-11 (×5): qty 1000

## 2019-07-11 MED ORDER — SODIUM CHLORIDE 0.9 % IV SOLN: INTRAVENOUS | Status: DC

## 2019-07-11 MED ORDER — FLUCONAZOLE 100 MG PO TABS
400.0000 mg | ORAL_TABLET | Freq: Every day | ORAL | Status: DC
  Administered 2019-07-11 – 2019-07-16 (×6): 400 mg via ORAL
  Filled 2019-07-11 (×6): qty 4

## 2019-07-11 MED ORDER — INSULIN GLARGINE 100 UNIT/ML ~~LOC~~ SOLN
35.0000 [IU] | Freq: Every day | SUBCUTANEOUS | Status: DC
  Administered 2019-07-12 – 2019-07-16 (×5): 35 [IU] via SUBCUTANEOUS
  Filled 2019-07-11 (×5): qty 0.35

## 2019-07-11 NOTE — Progress Notes (Signed)
PROGRESS NOTE    [name]  [id number] DOB: 12/03/1970 DOA: 07/07/2019 PCP: Justin Blocker, MD      Brief Narrative:  [name] is a 48 y.o. M with morbid obesity, OSA, schizophrenia, and HTN who presented to ED on 07/08/2019 with back pain and fever for 1 week duration.  This is started with a small dimple.  No trauma or insect bite reported.  Patient was febrile at 47 F in the emergency department with leukocytosis of 17.3.  CT chest showed possible thymoma and extensive involvement of upper back concerning for cellulitis but no drainable abscess.  Patient was admitted under hospitalist service and was continued on IV antibiotics vancomycin.  CT surgery was consulted for possible thymoma and they recommended outpatient follow-up with [name].  He then developed fluctuating mass, suggesting abscess.  General surgery was consulted and he underwent incision and drainage on 07/10/2019.  Assessment & Plan:  Sepsis secondary to upper right back cellulitis and abscess: Patient has remained afebrile now.  Feels better than yesterday.  Still has stable leukocytosis.  Status post incision and drainage of the abscess on 07/10/2019.  Culture is growing fungus as well as gram-positive cocci.  Continue vancomycin and add fluconazole per pharmacy recommendations and follow final culture reports and tailor antibiotics.  AKI:  Admitted with Cr 1.1 which has climbed up to 1.41 today.  Likely due to sepsis.  Will resume IV fluids at 100 cc/h.  Recheck labs in the morning.  Continue to hold ARB.  Possible thymoma CT reports incidental finding of "numerous ground glass and soft tissue nodules at location of thymus", possible thymoma or residual thymus. -Consulted CT surgery, appreciate expertise --> needs outpatient follow up with [name] for follow up labs, as well as ordering MRI chest and/or scrotal ultrasound  Hypertension BP stable this AM -Hold irbesartan  Diabetes  mellitus type II: Per patient, he was always told that he was prediabetic however his hemoglobin A1c has always been in diabetes range and latest hemoglobin A1c has been 9.5 on 07/08/2019.  Seems to take glipizide at home.  He is on 25 units of Lantus and SSI here but blood sugar still elevated.  Will increase Lantus to 35 units.  Holding glipizide.  Schizophrenia This is a chart history from 2012.   Not on meds.  DVT prophylaxis: Lovenox Code Status: FULL Family Communication: None present.  Plan of care discussed with patient.  No questions. Disposition plan: Likely home once cleared by general surgery.  Consultants:   CT surgery  General Surgery  Procedures:   10/11 CT chest -- no abscess  10/13 [place] back -- 2x4 abscess  10/14 operative I&D  Antimicrobials:   Vancomycin 10/11 >>  Clindamycin x1 day  Zosyn x1 day   Fluconazole 07/11/2019>   Subjective: Patient seen and examined.  He states that he feels better.  Back pain is improved.  No new complaint.  Objective: Vitals:   07/10/19 1756 07/10/19 2000 07/11/19 0500 07/11/19 0647  BP: (!) 104/50 109/60  (!) 108/91  Pulse:    66  Resp:    20  Temp: 98.8 F (37.1 C) 98.4 F (36.9 C)  98.1 F (36.7 C)  TempSrc:  Oral  Oral  SpO2:  94%  92%  Weight:   (!) 177.5 kg   Height:        Intake/Output Summary (Last 24 hours) at 07/11/2019 1104 Last data filed at 07/11/2019 0653 Gross per 24 hour  Intake  1469.12 ml  Output 2285 ml  Net -815.88 ml   Filed Weights   07/10/19 0446 07/10/19 1359 07/11/19 0500  Weight: (!) 175.3 kg (!) 175.3 kg (!) 177.5 kg    Examination:  General exam: Appears calm and comfortable, morbidly obese Respiratory system: Clear to auscultation. Respiratory effort normal. Cardiovascular system: S1 & S2 heard, RRR. No JVD, murmurs, rubs, gallops or clicks. No pedal edema. Gastrointestinal system: Abdomen is nondistended, soft and nontender. No organomegaly or masses felt. Normal bowel  sounds heard. Central nervous system: Alert and oriented. No focal neurological deficits. Extremities: Symmetric 5 x 5 power. Skin: No rashes, lesions or ulcers.  Has dressing in the back.  Direct visualization was deferred to the concern especially. Psychiatry: Judgement and insight appear normal. Mood & affect appropriate.   Data Reviewed: I have personally reviewed following labs and imaging studies:  CBC: Recent Labs  Lab 07/07/19 1600 07/08/19 0412 07/09/19 0224 07/10/19 0405 07/11/19 0303  WBC 17.3* 16.2* 17.5* 20.2* 20.9*  NEUTROABS 13.9* 13.4*  --   --   --   HGB 13.4 12.5* 12.6* 11.9* 11.6*  HCT 41.5 38.0* 37.8* 36.5* 35.8*  MCV 88.5 87.2 87.9 88.2 88.6  PLT 238 212 188 219 [id number]   Basic Metabolic Panel: Recent Labs  Lab 07/08/19 0412 07/09/19 0224 07/10/19 0405 07/10/19 2310 07/11/19 0303  NA 132* 133* 132* 132* 136  K 4.0 3.7 3.7 4.2 4.4  CL 100 98 102 102 105  CO2 21* 24 22 20* 21*  GLUCOSE 292* 223* 245* 378* 296*  BUN 12 10 10 16 15   CREATININE 1.14 1.26* 1.44* 1.52* 1.41*  CALCIUM 8.7* 8.7* 8.3* 8.1* 8.4*   GFR: Estimated Creatinine Clearance: 109.1 mL/min (A) (by C-G formula based on SCr of 1.41 mg/dL (H)). Liver Function Tests: Recent Labs  Lab 07/07/19 1600 07/08/19 0412 07/10/19 2310  AST 14* 13* 19  ALT 17 18 21   ALKPHOS 99 92 78  BILITOT 1.4* 1.1 0.6  PROT 8.1 7.3 6.6  ALBUMIN 3.0* 2.7* 2.1*   No results for input(s): LIPASE, AMYLASE in the last 168 hours. No results for input(s): AMMONIA in the last 168 hours. Coagulation Profile: No results for input(s): INR, PROTIME in the last 168 hours. Cardiac Enzymes: Recent Labs  Lab 07/08/19 0412  CKTOTAL 210   BNP (last 3 results) No results for input(s): PROBNP in the last 8760 hours. HbA1C: No results for input(s): HGBA1C in the last 72 hours. CBG: Recent Labs  Lab 07/10/19 1048 07/10/19 1356 07/10/19 1543 07/10/19 2142 07/11/19 0632  GLUCAP 187* 124* 144* 457* 260*   Lipid  Profile: No results for input(s): CHOL, HDL, LDLCALC, TRIG, CHOLHDL, LDLDIRECT in the last 72 hours. Thyroid Function Tests: No results for input(s): TSH, T4TOTAL, FREET4, T3FREE, THYROIDAB in the last 72 hours. Anemia Panel: No results for input(s): VITAMINB12, FOLATE, FERRITIN, TIBC, IRON, RETICCTPCT in the last 72 hours. Urine analysis:    Component Value Date/Time   COLORURINE YELLOW 03/22/2014 0401   APPEARANCEUR CLEAR 03/22/2014 0401   LABSPEC 1.025 03/22/2014 0401   PHURINE 5.5 03/22/2014 0401   GLUCOSEU NEGATIVE 03/22/2014 0401   HGBUR NEGATIVE 03/22/2014 0401   HGBUR small 10/17/2008 0000   BILIRUBINUR neg 08/11/2015 1652   KETONESUR NEGATIVE 03/22/2014 0401   PROTEINUR 3+ 08/11/2015 1652   PROTEINUR >300 (A) 03/22/2014 0401   UROBILINOGEN 0.2 08/11/2015 1652   UROBILINOGEN 0.2 03/22/2014 0401   NITRITE neg 08/11/2015 1652   NITRITE NEGATIVE 03/22/2014 0401  LEUKOCYTESUR Negative 08/11/2015 1652   Sepsis Labs: @LABRCNTIP (procalcitonin:4,lacticacidven:4)  ) Recent Results (from the past 240 hour(s))  SARS CORONAVIRUS 2 (TAT 6-24 HRS) Nasopharyngeal Nasopharyngeal Swab     Status: None   Collection Time: 07/07/19  6:05 PM   Specimen: Nasopharyngeal Swab  Result Value Ref Range Status   SARS Coronavirus 2 NEGATIVE NEGATIVE Final    Comment: (NOTE) SARS-CoV-2 target nucleic acids are NOT DETECTED. The SARS-CoV-2 RNA is generally detectable in upper and lower respiratory specimens during the acute phase of infection. Negative results do not preclude SARS-CoV-2 infection, do not rule out co-infections with other pathogens, and should not be used as the sole basis for treatment or other patient management decisions. Negative results must be combined with clinical observations, patient history, and epidemiological information. The expected result is Negative. Fact Sheet for Patients: SugarRoll.be Fact Sheet for Healthcare Providers:  https://www.woods-mathews.com/ This test is not yet approved or cleared by the [place] FDA and  has been authorized for detection and/or diagnosis of SARS-CoV-2 by FDA under an Emergency Use Authorization (EUA). This EUA will remain  in effect (meaning this test can be used) for the duration of the COVID-19 declaration under Section 56 4(b)(1) of the Act, 21 U.S.C. section 360bbb-3(b)(1), unless the authorization is terminated or revoked sooner. Performed at [facility], [place] [address], [place], [place] [address]   Culture, blood (routine x 2)     Status: None (Preliminary result)   Collection Time: 07/08/19  8:46 AM   Specimen: BLOOD LEFT HAND  Result Value Ref Range Status   Specimen Description BLOOD LEFT HAND  Final   Special Requests   Final    BOTTLES DRAWN AEROBIC ONLY Blood Culture results may not be optimal due to an inadequate volume of blood received in culture bottles   Culture   Final    NO GROWTH 3 DAYS Performed at [facility], [place] [address], [place], [place] [address]    Report Status PENDING  Incomplete  Culture, blood (routine x 2)     Status: None (Preliminary result)   Collection Time: 07/08/19  8:51 AM   Specimen: BLOOD RIGHT HAND  Result Value Ref Range Status   Specimen Description BLOOD RIGHT HAND  Final   Special Requests   Final    BOTTLES DRAWN AEROBIC AND ANAEROBIC Blood Culture adequate volume   Culture   Final    NO GROWTH 3 DAYS Performed at [facility], [place] [address] [place], [place] [address]    Report Status PENDING  Incomplete  Surgical PCR screen     Status: None   Collection Time: 07/10/19  1:31 PM   Specimen: Nasal Mucosa; Nasal Swab  Result Value Ref Range Status   MRSA, PCR NEGATIVE NEGATIVE Final   Staphylococcus aureus NEGATIVE NEGATIVE Final    Comment: (NOTE) The Xpert SA Assay (FDA approved for NASAL specimens in patients 64 years of age and older), is one component of a comprehensive  surveillance program. It is not intended to diagnose infection nor to guide or monitor treatment. Performed at [facility], [place] [address] [place], [place] [address]   Aerobic/Anaerobic Culture (surgical/deep wound)     Status: None (Preliminary result)   Collection Time: 07/10/19  3:01 PM   Specimen: PATH Other; Tissue  Result Value Ref Range Status   Specimen Description ABSCESS BACK  Final   Special Requests PT ON VANC  Final   Gram Stain  Final    FEW WBC PRESENT, PREDOMINANTLY PMN FEW GRAM POSITIVE COCCI IN PAIRS IN CHAINS RARE YEAST Performed at [facility], [place] [address], [place], [place] [address]    Culture PENDING  Incomplete   Report Status PENDING  Incomplete     Radiology Studies: [place] Chest Soft Tissue  Result Date: 07/09/2019 CLINICAL DATA:  Soft tissue abscess of the mid upper back. EXAM: ULTRASOUND OF HEAD/NECK SOFT TISSUES TECHNIQUE: Ultrasound examination of the head and neck soft tissues was performed in the area of clinical concern. COMPARISON:  None. FINDINGS: There is a well-defined heterogeneous 2.0 x 1.5 x 1.5 cm mass in the soft tissues in the area of concern. The abnormality has a 5 mm long tail which extends to the skin surface. No appreciable perfusion in the mass. IMPRESSION: Ultrasound exam is consistent with a subcutaneous abscess, as described. Electronically Signed   By: Lorriane Shire M.D.   On: 07/09/2019 13:53    Scheduled Meds: . enoxaparin (LOVENOX) injection  90 mg Subcutaneous Q24H  . fluconazole  400 mg Oral Daily  . insulin aspart  0-20 Units Subcutaneous TID WC  . insulin aspart  0-5 Units Subcutaneous QHS  . insulin aspart  4 Units Subcutaneous TID WC  . [START ON 07/12/2019] insulin glargine  35 Units Subcutaneous Daily  . mupirocin ointment  1 application Nasal BID  . rosuvastatin  10 mg Oral Daily  . tamsulosin  0.4 mg Oral QHS   Continuous Infusions: . sodium chloride Stopped (07/11/19 0148)  . vancomycin 250  mL/hr at 07/11/19 0300     LOS: 4 days   Time spent: 32 minutes  Darliss Cheney, MD [facility] 07/11/2019, 11:04 AM   Please page through [place]:  [email or web address] Password [email or web address] If 7PM-7AM, please contact night-coverage

## 2019-07-11 NOTE — Progress Notes (Signed)
Placed patient on CPAP set at [facility] for the night.

## 2019-07-11 NOTE — Progress Notes (Signed)
[facility] Progress Note  1 Day Post-Op  Subjective: CC: back wound Patient denies pain at this time, dressing was changed earlier this AM and he had some pain with that.   Objective: Vital signs in last 24 hours: Temp:  [97 F (36.1 C)-99.5 F (37.5 C)] 98.1 F (36.7 C) (10/15 0647) Pulse Rate:  [66-90] 66 (10/15 0647) Resp:  [13-31] 20 (10/15 0647) BP: (88-132)/(47-91) 108/91 (10/15 0647) SpO2:  [88 %-99 %] 92 % (10/15 0647) Weight:  [175.3 kg-177.5 kg] 177.5 kg (10/15 0500) Last BM Date: 07/10/19  Intake/Output from previous day: 10/14 0701 - 10/15 0700 In: 1469.1 [I.V.:989.3; IV Piggyback:479.8] Out: 2285 [Urine:2275; Blood:10] Intake/Output this shift: No intake/output data recorded.  PE: Gen:  Alert, NAD, pleasant Card:  Regular rate and rhythm Pulm:  Normal effort Back: wound as below, minimal drainage, surrounding erythema, minimal tenderness  Skin: warm and dry, no rashes  Psych: A&Ox3   Lab Results:  Recent Labs    07/10/19 0405 07/11/19 0303  WBC 20.2* 20.9*  HGB 11.9* 11.6*  HCT 36.5* 35.8*  PLT 219 248   BMET Recent Labs    07/10/19 2310 07/11/19 0303  NA 132* 136  K 4.2 4.4  CL 102 105  CO2 20* 21*  GLUCOSE 378* 296*  BUN 16 15  CREATININE 1.52* 1.41*  CALCIUM 8.1* 8.4*   PT/INR No results for input(s): LABPROT, INR in the last 72 hours. CMP     Component Value Date/Time   NA 136 07/11/2019 0303   K 4.4 07/11/2019 0303   CL 105 07/11/2019 0303   CO2 21 (L) 07/11/2019 0303   GLUCOSE 296 (H) 07/11/2019 0303   GLUCOSE 123 (H) 08/11/2006 1127   BUN 15 07/11/2019 0303   CREATININE 1.41 (H) 07/11/2019 0303   CALCIUM 8.4 (L) 07/11/2019 0303   PROT 6.6 07/10/2019 2310   ALBUMIN 2.1 (L) 07/10/2019 2310   AST 19 07/10/2019 2310   ALT 21 07/10/2019 2310   ALKPHOS 78 07/10/2019 2310   BILITOT 0.6 07/10/2019 2310   GFRNONAA 58 (L) 07/11/2019 0303   GFRAA >60 07/11/2019 0303   Lipase     Component Value Date/Time   LIPASE 13 09/06/2013 1950       Studies/Results: [place] Chest Soft Tissue  Result Date: 07/09/2019 CLINICAL DATA:  Soft tissue abscess of the mid upper back. EXAM: ULTRASOUND OF HEAD/NECK SOFT TISSUES TECHNIQUE: Ultrasound examination of the head and neck soft tissues was performed in the area of clinical concern. COMPARISON:  None. FINDINGS: There is a well-defined heterogeneous 2.0 x 1.5 x 1.5 cm mass in the soft tissues in the area of concern. The abnormality has a 5 mm long tail which extends to the skin surface. No appreciable perfusion in the mass. IMPRESSION: Ultrasound exam is consistent with a subcutaneous abscess, as described. Electronically Signed   By: Lorriane Shire M.D.   On: 07/09/2019 13:53    Anti-infectives: Anti-infectives (From admission, onward)   Start     Dose/Rate Route Frequency Ordered Stop   07/10/19 1230  vancomycin (VANCOCIN) 1,500 mg in sodium chloride 0.9 % 500 mL IVPB     1,500 mg 250 mL/hr over 120 Minutes Intravenous Every 12 hours 07/10/19 1149     07/09/19 2330  vancomycin (VANCOCIN) 1,750 mg in sodium chloride 0.9 % 500 mL IVPB  Status:  Discontinued     1,750 mg 250 mL/hr over 120 Minutes Intravenous Every 12 hours 07/09/19 1033 07/10/19 1146   07/09/19  1130  vancomycin (VANCOCIN) 2,500 mg in sodium chloride 0.9 % 500 mL IVPB     2,500 mg 250 mL/hr over 120 Minutes Intravenous  Once 07/09/19 1033 07/09/19 1405   07/08/19 1400  clindamycin (CLEOCIN) IVPB 600 mg  Status:  Discontinued     600 mg 100 mL/hr over 30 Minutes Intravenous Every 8 hours 07/08/19 1016 07/09/19 1003   07/08/19 1100  vancomycin (VANCOCIN) 1,750 mg in sodium chloride 0.9 % 500 mL IVPB  Status:  Discontinued     1,750 mg 250 mL/hr over 120 Minutes Intravenous Every 12 hours 07/07/19 2037 07/08/19 1016   07/08/19 0500  piperacillin-tazobactam (ZOSYN) IVPB 3.375 g  Status:  Discontinued     3.375 g 12.5 mL/hr over 240 Minutes Intravenous Every 8 hours 07/07/19 2032 07/08/19 1016    07/07/19 2045  piperacillin-tazobactam (ZOSYN) IVPB 3.375 g     3.375 g 100 mL/hr over 30 Minutes Intravenous  Once 07/07/19 2032 07/07/19 2211   07/07/19 2045  vancomycin (VANCOCIN) 2,500 mg in sodium chloride 0.9 % 500 mL IVPB     2,500 mg 250 mL/hr over 120 Minutes Intravenous  Once 07/07/19 2032 07/08/19 0104   07/07/19 1715  clindamycin (CLEOCIN) IVPB 600 mg     600 mg 100 mL/hr over 30 Minutes Intravenous  Once 07/07/19 1711 07/07/19 1800       Assessment/Plan Back abscess S/p I&D 07/10/19 Dr. Georgette Dover - continue BID dressing changes - cxs pending - if clean in the next few days, can likely VAC    LOS: 4 days    Brigid Re , [facility] 07/11/2019, 9:38 AM Pager: [phone] Consults: [phone] 7:00 AM - 4:30 PM M, W-F 7:00 AM - 11:30 AM Tues, Sat, Sun

## 2019-07-11 NOTE — Progress Notes (Signed)
Pt CBG=457. [facility] paged and given an order for 12U Novolog. Order placed for blood glucose recheck also. Will continue to monitor. Lajoyce Corners, RN

## 2019-07-12 LAB — MAGNESIUM: Magnesium: 1.7 mg/dL (ref 1.7–2.4)

## 2019-07-12 LAB — COMPREHENSIVE METABOLIC PANEL
ALT: 24 U/L (ref 0–44)
AST: 21 U/L (ref 15–41)
Albumin: 2 g/dL — ABNORMAL LOW (ref 3.5–5.0)
Alkaline Phosphatase: 74 U/L (ref 38–126)
Anion gap: 7 (ref 5–15)
BUN: 18 mg/dL (ref 6–20)
CO2: 20 mmol/L — ABNORMAL LOW (ref 22–32)
Calcium: 8.1 mg/dL — ABNORMAL LOW (ref 8.9–10.3)
Chloride: 106 mmol/L (ref 98–111)
Creatinine, Ser: 1.26 mg/dL — ABNORMAL HIGH (ref 0.61–1.24)
GFR calc Af Amer: >60 mL/min (ref >60)
GFR calc non Af Amer: >60 mL/min (ref >60)
Glucose, Bld: 296 mg/dL — ABNORMAL HIGH (ref 70–99)
Potassium: 4.4 mmol/L (ref 3.5–5.1)
Sodium: 133 mmol/L — ABNORMAL LOW (ref 135–145)
Total Bilirubin: 0.4 mg/dL (ref 0.3–1.2)
Total Protein: 6.1 g/dL — ABNORMAL LOW (ref 6.5–8.1)

## 2019-07-12 LAB — CBC WITH DIFFERENTIAL/PLATELET
Abs Immature Granulocytes: 0.15 10*3/uL — ABNORMAL HIGH (ref 0.00–0.07)
Basophils Absolute: 0.1 10*3/uL (ref 0.0–0.1)
Basophils Relative: 1 %
Eosinophils Absolute: 0.7 10*3/uL — ABNORMAL HIGH (ref 0.0–0.5)
Eosinophils Relative: 5 %
HCT: 34 % — ABNORMAL LOW (ref 39.0–52.0)
Hemoglobin: 11 g/dL — ABNORMAL LOW (ref 13.0–17.0)
Immature Granulocytes: 1 %
Lymphocytes Relative: 11 %
Lymphs Abs: 1.7 10*3/uL (ref 0.7–4.0)
MCH: 28.7 pg (ref 26.0–34.0)
MCHC: 32.4 g/dL (ref 30.0–36.0)
MCV: 88.8 fL (ref 80.0–100.0)
Monocytes Absolute: 1 10*3/uL (ref 0.1–1.0)
Monocytes Relative: 7 %
Neutro Abs: 11.9 10*3/uL — ABNORMAL HIGH (ref 1.7–7.7)
Neutrophils Relative %: 75 %
Platelets: 231 10*3/uL (ref 150–400)
RBC: 3.83 MIL/uL — ABNORMAL LOW (ref 4.22–5.81)
RDW: 13 % (ref 11.5–15.5)
WBC: 15.5 10*3/uL — ABNORMAL HIGH (ref 4.0–10.5)
nRBC: 0 % (ref 0.0–0.2)

## 2019-07-12 LAB — GLUCOSE, CAPILLARY
Glucose-Capillary: 236 mg/dL — ABNORMAL HIGH (ref 70–99)
Glucose-Capillary: 198 mg/dL — ABNORMAL HIGH (ref 70–99)
Glucose-Capillary: 215 mg/dL — ABNORMAL HIGH (ref 70–99)
Glucose-Capillary: 165 mg/dL — ABNORMAL HIGH (ref 70–99)

## 2019-07-12 LAB — SURGICAL PATHOLOGY

## 2019-07-12 MED ORDER — ENOXAPARIN SODIUM 100 MG/ML ~~LOC~~ SOLN
89.0000 mg | SUBCUTANEOUS | Status: DC
  Administered 2019-07-12 – 2019-07-15 (×4): 90 mg via SUBCUTANEOUS
  Filled 2019-07-12 (×4): qty 1

## 2019-07-12 MED ORDER — VANCOMYCIN HCL 10 G IV SOLR
1750.0000 mg | Freq: Two times a day (BID) | INTRAVENOUS | Status: AC
  Administered 2019-07-12 – 2019-07-14 (×5): 1750 mg via INTRAVENOUS
  Filled 2019-07-12 (×6): qty 1750

## 2019-07-12 NOTE — Progress Notes (Signed)
PROGRESS NOTE    [name]  [id number] DOB: 02-May-1971 DOA: 07/07/2019 PCP: Rogers Blocker, MD      Brief Narrative:  [name] is a 48 y.o. M with morbid obesity, OSA, schizophrenia, and HTN who presented to ED on 07/08/2019 with back pain and fever for 1 week duration.  This is started with a small dimple.  No trauma or insect bite reported.  Patient was febrile at 40 F in the emergency department with leukocytosis of 17.3.  CT chest showed possible thymoma and extensive involvement of upper back concerning for cellulitis but no drainable abscess.  Patient was admitted under hospitalist service and was continued on IV antibiotics vancomycin.  CT surgery was consulted for possible thymoma and they recommended outpatient follow-up with Dr. Kipp [name].  He then developed fluctuating mass, suggesting abscess.  General surgery was consulted and he underwent incision and drainage on 07/10/2019.   Assessment & Plan:  Sepsis secondary to upper right back cellulitis and abscess: Patient has remained afebrile now.  Feels better than yesterday.  Leukocytosis with some improvement.  Status post incision and drainage of the abscess on 07/10/2019.  Culture is growing fungus as well as gram-positive cocci.  Continue vancomycin and fluconazole per pharmacy recommendations and follow final culture reports and tailor antibiotics.  AKI:  Admitted with Cr 1.1 which has climbed up to 1.5 to maximum.  Much better today at 1.26.  Encouraged fluid intake.  Monitor labs daily.  Possible thymoma CT reports incidental finding of "numerous ground glass and soft tissue nodules at location of thymus", possible thymoma or residual thymus. -Consulted CT surgery, appreciate expertise --> needs outpatient follow up with Dr. Kipp [name] for follow up labs, as well as ordering MRI chest and/or scrotal ultrasound  Hypertension BP stable this AM -Continue to hold irbesartan  Diabetes mellitus type II: Per  patient, he was always told that he was prediabetic however his hemoglobin A1c has always been in diabetes range and latest hemoglobin A1c has been 9.5 on 07/08/2019.  Seems to take glipizide at home.  Blood sugar slightly better than yesterday.  Continue current dose of Lantus 35 units and SSI.  Schizophrenia This is a chart history from 2012.   Not on meds.  DVT prophylaxis: Lovenox Code Status: FULL Family Communication: None present.  Plan of care discussed with patient.  No questions. Disposition plan: Likely home once cleared by general surgery.  Consultants:   CT surgery  General Surgery  Procedures:   10/11 CT chest -- no abscess  10/13 [place] back -- 2x4 abscess  10/14 operative I&D  Antimicrobials:   Vancomycin 10/11 >>  Clindamycin x1 day  Zosyn x1 day   Fluconazole 07/11/2019>   Subjective: Patient seen and examined.  Back pain improved overall feels better.  No new complaint. Objective: Vitals:   07/11/19 1944 07/11/19 2144 07/12/19 0104 07/12/19 0500  BP: 122/84  121/71   Pulse: (!) 56 81 (!) 50   Resp: 12 19    Temp: 97.6 F (36.4 C)  97.8 F (36.6 C)   TempSrc: Oral  Axillary   SpO2: 97% 95% 98%   Weight:    (!) 180.9 kg  Height:        Intake/Output Summary (Last 24 hours) at 07/12/2019 1013 Last data filed at 07/12/2019 0502 Gross per 24 hour  Intake 1004.3 ml  Output 3575 ml  Net -2570.7 ml   Filed Weights   07/10/19 1359 07/11/19 0500 07/12/19 0500  Weight: Marland Kitchen)  175.3 kg (!) 177.5 kg (!) 180.9 kg    Examination:  General exam: Appears calm and comfortable, morbidly obese Respiratory system: Clear to auscultation. Respiratory effort normal. Cardiovascular system: S1 & S2 heard, RRR. No JVD, murmurs, rubs, gallops or clicks. No pedal edema. Gastrointestinal system: Abdomen is nondistended, soft and nontender. No organomegaly or masses felt. Normal bowel sounds heard. Central nervous system: Alert and oriented. No focal neurological  deficits. Extremities: Symmetric 5 x 5 power. Skin: No rashes, lesions or ulcers.  Has dressing in the upper back.  Direct visualization of the wound deferred since he just had dressing placed. Psychiatry: Judgement and insight appear normal. Mood & affect appropriate.   Data Reviewed: I have personally reviewed following labs and imaging studies:  CBC: Recent Labs  Lab 07/07/19 1600 07/08/19 0412 07/09/19 0224 07/10/19 0405 07/11/19 0303 07/12/19 0140  WBC 17.3* 16.2* 17.5* 20.2* 20.9* 15.5*  NEUTROABS 13.9* 13.4*  --   --   --  11.9*  HGB 13.4 12.5* 12.6* 11.9* 11.6* 11.0*  HCT 41.5 38.0* 37.8* 36.5* 35.8* 34.0*  MCV 88.5 87.2 87.9 88.2 88.6 88.8  PLT 238 212 188 219 248 [id number]   Basic Metabolic Panel: Recent Labs  Lab 07/09/19 0224 07/10/19 0405 07/10/19 2310 07/11/19 0303 07/12/19 0140  NA 133* 132* 132* 136 133*  K 3.7 3.7 4.2 4.4 4.4  CL 98 102 102 105 106  CO2 24 22 20* 21* 20*  GLUCOSE 223* 245* 378* 296* 296*  BUN 10 10 16 15 18   CREATININE 1.26* 1.44* 1.52* 1.41* 1.26*  CALCIUM 8.7* 8.3* 8.1* 8.4* 8.1*  MG  --   --   --   --  1.7   GFR: Estimated Creatinine Clearance: 123.5 mL/min (A) (by C-G formula based on SCr of 1.26 mg/dL (H)). Liver Function Tests: Recent Labs  Lab 07/07/19 1600 07/08/19 0412 07/10/19 2310 07/12/19 0140  AST 14* 13* 19 21  ALT 17 18 21 24   ALKPHOS 99 92 78 74  BILITOT 1.4* 1.1 0.6 0.4  PROT 8.1 7.3 6.6 6.1*  ALBUMIN 3.0* 2.7* 2.1* 2.0*   No results for input(s): LIPASE, AMYLASE in the last 168 hours. No results for input(s): AMMONIA in the last 168 hours. Coagulation Profile: No results for input(s): INR, PROTIME in the last 168 hours. Cardiac Enzymes: Recent Labs  Lab 07/08/19 0412  CKTOTAL 210   BNP (last 3 results) No results for input(s): PROBNP in the last 8760 hours. HbA1C: No results for input(s): HGBA1C in the last 72 hours. CBG: Recent Labs  Lab 07/11/19 0632 07/11/19 1108 07/11/19 1600 07/11/19 2121  07/12/19 0608  GLUCAP 260* 242* 388* 285* 236*   Lipid Profile: No results for input(s): CHOL, HDL, LDLCALC, TRIG, CHOLHDL, LDLDIRECT in the last 72 hours. Thyroid Function Tests: No results for input(s): TSH, T4TOTAL, FREET4, T3FREE, THYROIDAB in the last 72 hours. Anemia Panel: No results for input(s): VITAMINB12, FOLATE, FERRITIN, TIBC, IRON, RETICCTPCT in the last 72 hours. Urine analysis:    Component Value Date/Time   COLORURINE YELLOW 03/22/2014 0401   APPEARANCEUR CLEAR 03/22/2014 0401   LABSPEC 1.025 03/22/2014 0401   PHURINE 5.5 03/22/2014 0401   GLUCOSEU NEGATIVE 03/22/2014 0401   HGBUR NEGATIVE 03/22/2014 0401   HGBUR small 10/17/2008 0000   BILIRUBINUR neg 08/11/2015 1652   KETONESUR NEGATIVE 03/22/2014 0401   PROTEINUR 3+ 08/11/2015 1652   PROTEINUR >300 (A) 03/22/2014 0401   UROBILINOGEN 0.2 08/11/2015 1652   UROBILINOGEN 0.2 03/22/2014 0401  NITRITE neg 08/11/2015 1652   NITRITE NEGATIVE 03/22/2014 0401   LEUKOCYTESUR Negative 08/11/2015 1652   Sepsis Labs: @LABRCNTIP (procalcitonin:4,lacticacidven:4)  ) Recent Results (from the past 240 hour(s))  SARS CORONAVIRUS 2 (TAT 6-24 HRS) Nasopharyngeal Nasopharyngeal Swab     Status: None   Collection Time: 07/07/19  6:05 PM   Specimen: Nasopharyngeal Swab  Result Value Ref Range Status   SARS Coronavirus 2 NEGATIVE NEGATIVE Final    Comment: (NOTE) SARS-CoV-2 target nucleic acids are NOT DETECTED. The SARS-CoV-2 RNA is generally detectable in upper and lower respiratory specimens during the acute phase of infection. Negative results do not preclude SARS-CoV-2 infection, do not rule out co-infections with other pathogens, and should not be used as the sole basis for treatment or other patient management decisions. Negative results must be combined with clinical observations, patient history, and epidemiological information. The expected result is Negative. Fact Sheet for Patients:  SugarRoll.be Fact Sheet for Healthcare Providers: https://www.woods-mathews.com/ This test is not yet approved or cleared by the [place] FDA and  has been authorized for detection and/or diagnosis of SARS-CoV-2 by FDA under an Emergency Use Authorization (EUA). This EUA will remain  in effect (meaning this test can be used) for the duration of the COVID-19 declaration under Section 56 4(b)(1) of the Act, 21 U.S.C. section 360bbb-3(b)(1), unless the authorization is terminated or revoked sooner. Performed at [facility], [place] [address], [place], [place] [address]   Culture, blood (routine x 2)     Status: None (Preliminary result)   Collection Time: 07/08/19  8:46 AM   Specimen: BLOOD LEFT HAND  Result Value Ref Range Status   Specimen Description BLOOD LEFT HAND  Final   Special Requests   Final    BOTTLES DRAWN AEROBIC ONLY Blood Culture results may not be optimal due to an inadequate volume of blood received in culture bottles   Culture   Final    NO GROWTH 4 DAYS Performed at [facility], [place] [address] [place], [place] [address]    Report Status PENDING  Incomplete  Culture, blood (routine x 2)     Status: None (Preliminary result)   Collection Time: 07/08/19  8:51 AM   Specimen: BLOOD RIGHT HAND  Result Value Ref Range Status   Specimen Description BLOOD RIGHT HAND  Final   Special Requests   Final    BOTTLES DRAWN AEROBIC AND ANAEROBIC Blood Culture adequate volume   Culture   Final    NO GROWTH 4 DAYS Performed at [facility], [place] [address] [place], [place] [address]    Report Status PENDING  Incomplete  Surgical PCR screen     Status: None   Collection Time: 07/10/19  1:31 PM   Specimen: Nasal Mucosa; Nasal Swab  Result Value Ref Range Status   MRSA, PCR NEGATIVE NEGATIVE Final   Staphylococcus aureus NEGATIVE NEGATIVE Final    Comment: (NOTE) The Xpert SA Assay (FDA approved for NASAL  specimens in patients 29 years of age and older), is one component of a comprehensive surveillance program. It is not intended to diagnose infection nor to guide or monitor treatment. Performed at [facility], [place] [address], [place], [place] [address]   Aerobic/Anaerobic Culture (surgical/deep wound)     Status: None (Preliminary result)   Collection Time: 07/10/19  3:01 PM   Specimen: PATH Other; Tissue  Result Value Ref Range Status   Specimen Description ABSCESS BACK  Final  Special Requests PT ON VANC  Final   Gram Stain   Final    FEW WBC PRESENT, PREDOMINANTLY PMN FEW GRAM POSITIVE COCCI IN PAIRS IN CHAINS RARE YEAST    Culture   Final    NO GROWTH < 24 HOURS Performed at [facility], [address] [place], [place] [address]    Report Status PENDING  Incomplete     Radiology Studies: No results found.  Scheduled Meds: . fluconazole  400 mg Oral Daily  . insulin aspart  0-20 Units Subcutaneous TID WC  . insulin aspart  0-5 Units Subcutaneous QHS  . insulin aspart  4 Units Subcutaneous TID WC  . insulin glargine  35 Units Subcutaneous Daily  . mupirocin ointment  1 application Nasal BID  . rosuvastatin  10 mg Oral Daily  . tamsulosin  0.4 mg Oral QHS   Continuous Infusions: . sodium chloride Stopped (07/11/19 0148)  . 0.9 % sodium chloride with kcl 100 mL/hr at 07/12/19 0418  . vancomycin 1,500 mg (07/12/19 0104)     LOS: 5 days   Time spent: 28 minutes  Darliss Cheney, MD [facility] 07/12/2019, 10:13 AM   Please page through [place]:  [email or web address] Password [email or web address] If 7PM-7AM, please contact night-coverage

## 2019-07-12 NOTE — Progress Notes (Signed)
[facility] Progress Note  2 Days Post-Op  Subjective: CC: pain Pain at wound site, dressing was just changed.   Objective: Vital signs in last 24 hours: Temp:  [97.6 F (36.4 C)-97.9 F (36.6 C)] 97.8 F (36.6 C) (10/16 0104) Pulse Rate:  [50-81] 50 (10/16 0104) Resp:  [12-19] 19 (10/15 2144) BP: (121-127)/(71-84) 121/71 (10/16 0104) SpO2:  [95 %-98 %] 98 % (10/16 0104) Weight:  [180.9 kg] 180.9 kg (10/16 0500) Last BM Date: 07/10/19  Intake/Output from previous day: 10/15 0701 - 10/16 0700 In: 1004.3 [I.V.:503.2; IV Piggyback:501.1] Out: 3575 [Urine:3575] Intake/Output this shift: No intake/output data recorded.  PE: Gen:  Alert, NAD, pleasant Pulm:  Normal effort Back: improvement in surrounding erythema, slight increase in fibrous tissue present but overall clean  Skin: warm and dry, no rashes  Psych: A&Ox3   Lab Results:  Recent Labs    07/11/19 0303 07/12/19 0140  WBC 20.9* 15.5*  HGB 11.6* 11.0*  HCT 35.8* 34.0*  PLT 248 231   BMET Recent Labs    07/11/19 0303 07/12/19 0140  NA 136 133*  K 4.4 4.4  CL 105 106  CO2 21* 20*  GLUCOSE 296* 296*  BUN 15 18  CREATININE 1.41* 1.26*  CALCIUM 8.4* 8.1*   PT/INR No results for input(s): LABPROT, INR in the last 72 hours. CMP     Component Value Date/Time   NA 133 (L) 07/12/2019 0140   K 4.4 07/12/2019 0140   CL 106 07/12/2019 0140   CO2 20 (L) 07/12/2019 0140   GLUCOSE 296 (H) 07/12/2019 0140   GLUCOSE 123 (H) 08/11/2006 1127   BUN 18 07/12/2019 0140   CREATININE 1.26 (H) 07/12/2019 0140   CALCIUM 8.1 (L) 07/12/2019 0140   PROT 6.1 (L) 07/12/2019 0140   ALBUMIN 2.0 (L) 07/12/2019 0140   AST 21 07/12/2019 0140   ALT 24 07/12/2019 0140   ALKPHOS 74 07/12/2019 0140   BILITOT 0.4 07/12/2019 0140   GFRNONAA >60 07/12/2019 0140   GFRAA >60 07/12/2019 0140   Lipase     Component Value Date/Time   LIPASE 13 09/06/2013 1950       Studies/Results: No results  found.  Anti-infectives: Anti-infectives (From admission, onward)   Start     Dose/Rate Route Frequency Ordered Stop   07/12/19 1230  vancomycin (VANCOCIN) 1,750 mg in sodium chloride 0.9 % 500 mL IVPB     1,750 mg 250 mL/hr over 120 Minutes Intravenous Every 12 hours 07/12/19 1107     07/11/19 1200  fluconazole (DIFLUCAN) tablet 400 mg     400 mg Oral Daily 07/11/19 1056     07/10/19 1230  vancomycin (VANCOCIN) 1,500 mg in sodium chloride 0.9 % 500 mL IVPB  Status:  Discontinued     1,500 mg 250 mL/hr over 120 Minutes Intravenous Every 12 hours 07/10/19 1149 07/12/19 1107   07/09/19 2330  vancomycin (VANCOCIN) 1,750 mg in sodium chloride 0.9 % 500 mL IVPB  Status:  Discontinued     1,750 mg 250 mL/hr over 120 Minutes Intravenous Every 12 hours 07/09/19 1033 07/10/19 1146   07/09/19 1130  vancomycin (VANCOCIN) 2,500 mg in sodium chloride 0.9 % 500 mL IVPB     2,500 mg 250 mL/hr over 120 Minutes Intravenous  Once 07/09/19 1033 07/09/19 1405   07/08/19 1400  clindamycin (CLEOCIN) IVPB 600 mg  Status:  Discontinued     600 mg 100 mL/hr over 30 Minutes Intravenous Every 8 hours 07/08/19 1016 07/09/19  1003   07/08/19 1100  vancomycin (VANCOCIN) 1,750 mg in sodium chloride 0.9 % 500 mL IVPB  Status:  Discontinued     1,750 mg 250 mL/hr over 120 Minutes Intravenous Every 12 hours 07/07/19 2037 07/08/19 1016   07/08/19 0500  piperacillin-tazobactam (ZOSYN) IVPB 3.375 g  Status:  Discontinued     3.375 g 12.5 mL/hr over 240 Minutes Intravenous Every 8 hours 07/07/19 2032 07/08/19 1016   07/07/19 2045  piperacillin-tazobactam (ZOSYN) IVPB 3.375 g     3.375 g 100 mL/hr over 30 Minutes Intravenous  Once 07/07/19 2032 07/07/19 2211   07/07/19 2045  vancomycin (VANCOCIN) 2,500 mg in sodium chloride 0.9 % 500 mL IVPB     2,500 mg 250 mL/hr over 120 Minutes Intravenous  Once 07/07/19 2032 07/08/19 0104   07/07/19 1715  clindamycin (CLEOCIN) IVPB 600 mg     600 mg 100 mL/hr over 30 Minutes  Intravenous  Once 07/07/19 1711 07/07/19 1800       Assessment/Plan Back abscess S/p I&D 07/10/19 Dr. Georgette Dover - start VAC today, change MWF - cxs pending - placed orders for home VAC so CM can start working on that and [facility] RN - we will see for VAC change on Monday if patient still admitted, if not he can follow up with [facility] in 3 weeks    LOS: 5 days    Brigid Re , [facility] 07/12/2019, 11:53 AM Please see Amion for pager number during day hours 7:00am-4:30pm

## 2019-07-12 NOTE — Discharge Instructions (Signed)
Negative Pressure Wound Therapy Home Guide °Negative pressure wound therapy (NPWT) uses a sponge or foam-like material (dressing) placed on or inside the wound. The wound is then covered and sealed with a cover dressing that sticks to your skin (is adhesive). This keeps air out. A tube is attached to the cover dressing, and this tube connects to a small pump. The pump sucks fluid and germs from the wound. NPWT helps to increase blood flow to the wound and heal it from the inside. °What are the risks? °NPWT is usually safe to use. However, problems can occur, including: °· Skin irritation from the dressing adhesive. °· Bleeding. °· Infection. °· Dehydration. Wounds with large amounts of drainage can cause excessive fluid loss. °· Pain. °Supplies needed: °· A disposable garbage bag. °· Soap and water, or hand sanitizer. °· Wound cleanser or salt-water solution (saline). °· New sponge and cover dressing. °· Protective clothing. °· Gauze pad. °· Vinyl gloves. °· Tape. °· Skin protectant. This may be a wipe, film, or spray. °· Clean or germ-free (sterile) scissors. °· Eye protection. °How to change your dressing °Prepare to change your dressing ° °1. If told by your health care provider, take pain medicine 30 minutes before changing the dressing. °2. Wash your hands with soap and water. Dry your hands with a clean towel. If soap and water are not available, use hand sanitizer. °3. Set up a clean station for wound care. °4. Open the dressing package so that the sponge dressing remains on the inside of the package. °5. Wear gloves, protective clothing, and eye protection. °Remove old dressing ° °1. Turn off the pump and disconnect the tubing from the dressing. °2. Carefully remove the adhesive cover dressing in the direction of your hair growth. °3. Remove the sponge dressing that is inside the wound. If the sponge sticks, use a wound cleanser or saline solution to wet the sponge and help it come off more easily. °4. Throw  the old sponge and cover dressing supplies into the garbage bag. °5. Remove your gloves by grabbing the cuff and turning the glove inside out. Place the gloves in the trash immediately. °6. Wash your hands with soap and water. Dry your hands with a clean towel. If soap and water are not available, use hand sanitizer. °Clean your wound °· Wear gloves, protective clothing, and eye protection. °Follow your health care provider's instructions on how to clean your wound. You may be told to: °1. Clean the wound using a saline solution or a wound cleanser and a clean gauze pad. °2. Pat the wound dry with a gauze pad. Do not rub the wound. °3. Throw the gauze pad into the garbage bag. °4. Remove your gloves by grabbing the cuff and turning the glove inside out. Place the gloves in the trash immediately. °5. Wash your hands with soap and water. Dry your hands with a clean towel. If soap and water are not available, use hand sanitizer. °Apply new dressing °· Wear gloves, protective clothing, and eye protection. °1. If told by your health care provider, apply a skin protectant to any skin that will be exposed to adhesive. Let the skin protectant dry. °2. Cut a piece of new sponge dressing and put it on or in the wound. °3. Using clean scissors, cut a nickel-sized hole in the new cover dressing. °4. Apply the cover dressing. °5. Attach the suction tube over the hole in the cover dressing. °6. Take off your gloves. Put them in the   plastic bag with the old dressing. Tie the bag shut and throw it away. °7. Wash your hands with soap and water. Dry your hands with a clean towel. If soap and water are not available, use hand sanitizer. °8. Turn the pump back on. The sponge dressing should collapse. Do not change the settings on the machine without talking to a health care provider. °9. Replace the container in the pump that collects fluid if it is full. Replace the container per the manufacturer's instructions or at least once a  week, even if it is not full. °General tips and recommendations °If the alarm sounds: °· Stay calm. °· Do not turn off the pump or do anything with the dressing. °· Reasons the alarm may go off: °? The battery is low. Change the battery or plug the device into electrical power. °? The dressing has a leak. Find the leak and put tape over the leak. °? The fluid collection container is full. Change the fluid container. °· Call your health care provider right away if you cannot fix the problem. °· Explain to your health care provider what is happening. Follow his or her instructions. °General instructions °· Do not turn off the pump unless told to do so by your health care provider. °· Do not turn off the pump for more than 2 hours. If the pump is off for more than 2 hours, the dressing will need to be changed. °· If your health care provider says it is okay to shower: °? Do not take the pump into the shower. °? Make sure the wound dressing is protected and sealed. The wound dressing must stay dry. °· Check frequently that the machine indicates that therapy is on and that all clamps are open. °· Do not use over-the-counter medicated or antiseptic creams, sprays, liquids, or dressings unless your health care provider approves. °Contact a health care provider if: °· You have new pain. °· You develop irritation, a rash, or itching around the wound or dressing. °· You see new black or yellow tissue in your wound. °· The dressing changes are painful or cause bleeding. °· The pump has been off for more than 2 hours, and you do not know how to change the dressing. °· The pump alarm goes off, and you do not know what to do. °Get help right away if: °· You have a lot of bleeding. °· The wound breaks open. °· You have severe pain. °· You have signs of infection, such as: °? More redness, swelling, or pain. °? More fluid or blood. °? Warmth. °? Pus or a bad smell. °? Red streaks leading from the wound. °? A fever. °· You see a  sudden change in the color or texture of the drainage. °· You have signs of dehydration, such as: °? Little or no tears, urine, or sweat. °? Muscle cramps. °? Very dry mouth. °? Headache. °? Dizziness. °Summary °· Negative pressure wound therapy (NPWT) is a device that helps your wound heal. °· Set up a clean station for wound care. Your health care provider will tell you what supplies to use. °· Follow your health care provider's instructions on how to clean your wound and how to change the dressing. °· Contact a health care provider if you have new pain, an irritation, or a rash, or if the alarm goes off and you do not know what to do. °· Get help right away if you have a lot of bleeding, your wound breaks   open, or you have severe pain. Also, get help if you have signs of infection. °This information is not intended to replace advice given to you by your health care provider. Make sure you discuss any questions you have with your health care provider. °Document Released: 12/05/2011 Document Revised: 01/04/2019 Document Reviewed: 11/30/2018 °Elsevier Patient Education © 2020 [employer] ° °

## 2019-07-12 NOTE — Progress Notes (Signed)
Applied wound vac at 125 mmhg to mid upper back wound. Patient has two black sponges in wound. Patient says it hurts. Pain medication given. Pt resting with call bell within reach.  Will continue to monitor.

## 2019-07-12 NOTE — Progress Notes (Signed)
Pharmacy Antibiotic Note  [name] is a 48 y.o. male admitted on 07/07/2019 with cellulitis.  Pharmacy has been consulted for Vancomcyin dosing.   ID: r/o sepsis with extensive cellulitis of upper back/abscess.- Tmax 102.7>102>99.5 now afebrile, WBC 15.5 remains elevated but slowly declining, LA 1.4, PC 0.14, Scr up to 1.26 now declining. Awaiting culture speciation and can narrow abx. - 10/14 I&D of back.  Fluconazole 10/15>> Vanc 10/11 >>10/11, 10/13>> Zosyn 10/11 >>10/12 Clind 10/12>>10/13  10/11 covid - negative 10/12: BC x 2>>NGTD 10/14: Back abscess: GPC, rare yeast  - 10/11: Vanc 2500mg  IV x 1, then 1750mg  IV Q12H for AUC 528 using SCr 1.22 - 10/14: Vanco 1500mg  IV q 12 for AUC 518.9 using Scr 1.44 (VD 0.5) - 10/16: Scr down to 1.26, incr back to 1750mg /12h  Plan: Vancomycin 1750mg  IV q 12h Awaiting culture speciation and can narrow abx. Fluconazole 400mg  po daily Home when cleared by surgery.    Height: 6\' 3"  (190.5 cm) Weight: (!) 398 lb 12.8 oz (180.9 kg) IBW/kg (Calculated) : 84.5  Temp (24hrs), Avg:97.8 F (36.6 C), Min:97.6 F (36.4 C), Max:97.9 F (36.6 C)  Recent Labs  Lab 07/07/19 1600 07/08/19 0412 07/09/19 0224 07/10/19 0405 07/10/19 2310 07/11/19 0303 07/12/19 0140  WBC 17.3* 16.2* 17.5* 20.2*  --  20.9* 15.5*  CREATININE 1.22 1.14 1.26* 1.44* 1.52* 1.41* 1.26*  LATICACIDVEN 1.4  --   --   --   --   --   --     Estimated Creatinine Clearance: 123.5 mL/min (A) (by C-G formula based on SCr of 1.26 mg/dL (H)).    Allergies  Allergen Reactions  . Cucumber Extract Shortness Of Breath    Vong Garringer S. Alford [place], PharmD, BCPS Clinical Staff Pharmacist  [place], [place] 07/12/2019 11:08 AM

## 2019-07-12 NOTE — TOC Initial Note (Addendum)
Transition of Care (TOC) - Initial/Assessment Note  Marvetta Gibbons RN, BSN Transitions of Care Unit 4E- RN Case Manager [phone]   Patient Details  Name: [name] MRN: [id number] Date of Birth: 1971-05-18  Transition of Care [facility]) CM/SW Contact:    Dawayne Patricia, RN Phone Number: 07/12/2019, 2:26 PM  Clinical Narrative:                 Pt admitted with cellulitis/back abscess s/p I&D and wound VAC placement- referral received for [facility] and home wound VAC needs- [employer] home wound VAC form has been signed and faxed to [place] at [facility] is processing paperwork for home wound VAC approval- once approved home wound VAC to be delivered to room for transition home. CM spoke with pt at bedside- list provided to pt Per CMS guidelines from medicare.gov website with star ratings (copy placed in shadow chart)- for choice- per pt he does not have a preference- pt has [employer] and will have to go through [place]- ([place]) to contract for [facility] agency- process explained to pt- confirmed with pt address, phone # and PCP in epic. Call made to [facility] for [facility] referral needs- spoke with Collier Salina. Intake Ref. # - [id number] [facility] is in the process for finding a [place] agency to contract with for [facility] needs- orders and notes faxed to [phone]. - pt may need to have wound VAC changed here on Monday in order to have services confirmed with [facility] on 10/21 - [facility] will reach out to pt and CM to let us know when services have been secured for pt.   Benefits check completed for insulin pens- copay cost $8.95.   Expected Discharge Plan: [facility] [facility] Barriers to Discharge: Other (comment)(Await HH agency via [facility])   Patient Goals and CMS Choice Patient states their goals for this hospitalization and ongoing recovery are:: to get back to being healthy CMS Medicare.gov Compare Post Acute Care list provided to:: Patient Choice offered to / list  presented to : Patient  Expected Discharge Plan and Services Expected Discharge Plan: [place]   Discharge Planning Services: CM Consult Post Acute Care Choice: Home Health, Durable Medical Equipment Living arrangements for the past 2 months: Apartment                 DME Arranged: Vac DME Agency: [employer] Date DME Agency Contacted: 07/12/19 Time DME Agency Contacted: 19 Representative spoke with at DME Agency: [place]: RN([facility]- referral)   Date HH Agency Contacted: 07/12/19 Time [place]: 1400 Representative spoke with at [place]: Collier Salina at [facility] Arrangements/Services Living arrangements for the past 2 months: [place] with:: Spouse Patient language and need for interpreter reviewed:: Yes Do you feel safe going back to the place where you live?: Yes      Need for Family Participation in Patient Care: Yes (Comment) Care giver support system in place?: Yes (comment)   Criminal Activity/Legal Involvement Pertinent to Current Situation/Hospitalization: No - Comment as needed  Activities of Daily Living Home Assistive Devices/Equipment: Scales, Eyeglasses, Hand-held shower hose, CPAP ADL Screening (condition at time of admission) Patient's cognitive ability adequate to safely complete daily activities?: Yes Is the patient deaf or have difficulty hearing?: No Does the patient have difficulty seeing, even when wearing glasses/contacts?: No Does the patient have difficulty concentrating, remembering, or making decisions?: No Patient able to express need for  assistance with ADLs?: Yes Does the patient have difficulty dressing or bathing?: No Independently performs ADLs?: Yes (appropriate for developmental age) Does the patient have difficulty walking or climbing stairs?: No Weakness of Legs: None Weakness of Arms/Hands: None  Permission Sought/Granted Permission sought to share information with : Naval architect granted to share info w AGENCY: [facility]        Emotional Assessment Appearance:: Appears stated age Attitude/Demeanor/Rapport: Engaged Affect (typically observed): Appropriate, Pleasant Orientation: : Oriented to Self, Oriented to Place, Oriented to  Time, Oriented to Situation   Psych Involvement: No (comment)  Admission diagnosis:  SIRS (systemic inflammatory response syndrome) (HCC) [R65.10] Cellulitis of back [L03.312] Sepsis ([place]) [A41.9] Patient Active Problem List   Diagnosis Date Noted  . Hyperglycemia 07/08/2019  . Cellulitis of back   . Sepsis ([place]) 07/07/2019  . Preventative health care 08/11/2015  . HTN (hypertension) 12/20/2012  . Recurrent boils 12/20/2012  . OBSTRUCTIVE SLEEP APNEA 08/13/2010  . ANA POSITIVE 04/01/2010  . ERECTILE DYSFUNCTION, ORGANIC 03/30/2010  . Idiopathic chronic gout of right ankle without tophus 06/16/2009  . HYPERLIPIDEMIA 11/14/2008  . MORBID OBESITY 10/17/2008  . Diabetes mellitus type II, uncontrolled ([place]) 03/01/2007  . PROTEINURIA 03/01/2007  . SCHIZOPHRENIA 02/26/2007  . Bipolar disorder, unspecified ([place]) 02/26/2007   PCP:  Rogers Blocker, MD Pharmacy:   [facility] [place], [place] AT [address] [place] [address] Phone: [phone] Fax: [phone]     Social Determinants of Health (SDOH) Interventions    Readmission Risk Interventions No flowsheet data found.

## 2019-07-13 LAB — GLUCOSE, CAPILLARY
Glucose-Capillary: 183 mg/dL — ABNORMAL HIGH (ref 70–99)
Glucose-Capillary: 172 mg/dL — ABNORMAL HIGH (ref 70–99)
Glucose-Capillary: 133 mg/dL — ABNORMAL HIGH (ref 70–99)
Glucose-Capillary: 172 mg/dL — ABNORMAL HIGH (ref 70–99)

## 2019-07-13 LAB — CBC WITH DIFFERENTIAL/PLATELET
Abs Immature Granulocytes: 0.08 10*3/uL — ABNORMAL HIGH (ref 0.00–0.07)
Basophils Absolute: 0.1 10*3/uL (ref 0.0–0.1)
Basophils Relative: 1 %
Eosinophils Absolute: 0.6 10*3/uL — ABNORMAL HIGH (ref 0.0–0.5)
Eosinophils Relative: 7 %
HCT: 32.7 % — ABNORMAL LOW (ref 39.0–52.0)
Hemoglobin: 10.6 g/dL — ABNORMAL LOW (ref 13.0–17.0)
Immature Granulocytes: 1 %
Lymphocytes Relative: 23 %
Lymphs Abs: 2.2 10*3/uL (ref 0.7–4.0)
MCH: 28.9 pg (ref 26.0–34.0)
MCHC: 32.4 g/dL (ref 30.0–36.0)
MCV: 89.1 fL (ref 80.0–100.0)
Monocytes Absolute: 0.8 10*3/uL (ref 0.1–1.0)
Monocytes Relative: 9 %
Neutro Abs: 5.8 10*3/uL (ref 1.7–7.7)
Neutrophils Relative %: 59 %
Platelets: 222 10*3/uL (ref 150–400)
RBC: 3.67 MIL/uL — ABNORMAL LOW (ref 4.22–5.81)
RDW: 13 % (ref 11.5–15.5)
WBC: 9.6 10*3/uL (ref 4.0–10.5)
nRBC: 0 % (ref 0.0–0.2)

## 2019-07-13 LAB — CULTURE, BLOOD (ROUTINE X 2)
Culture: NO GROWTH
Culture: NO GROWTH
Special Requests: ADEQUATE

## 2019-07-13 LAB — COMPREHENSIVE METABOLIC PANEL
ALT: 24 U/L (ref 0–44)
AST: 22 U/L (ref 15–41)
Albumin: 2 g/dL — ABNORMAL LOW (ref 3.5–5.0)
Alkaline Phosphatase: 71 U/L (ref 38–126)
Anion gap: 7 (ref 5–15)
BUN: 15 mg/dL (ref 6–20)
CO2: 20 mmol/L — ABNORMAL LOW (ref 22–32)
Calcium: 7.9 mg/dL — ABNORMAL LOW (ref 8.9–10.3)
Chloride: 107 mmol/L (ref 98–111)
Creatinine, Ser: 1.24 mg/dL (ref 0.61–1.24)
GFR calc Af Amer: >60 mL/min (ref >60)
GFR calc non Af Amer: >60 mL/min (ref >60)
Glucose, Bld: 211 mg/dL — ABNORMAL HIGH (ref 70–99)
Potassium: 4.1 mmol/L (ref 3.5–5.1)
Sodium: 134 mmol/L — ABNORMAL LOW (ref 135–145)
Total Bilirubin: 0.6 mg/dL (ref 0.3–1.2)
Total Protein: 5.7 g/dL — ABNORMAL LOW (ref 6.5–8.1)

## 2019-07-13 NOTE — Progress Notes (Signed)
Patient refused CPAP HS as ordered by the provider. The risk, benefits, and any alternative device/therapy (if applicable) was discussed with the patient.

## 2019-07-13 NOTE — Progress Notes (Signed)
PROGRESS NOTE    [name]  [id number] DOB: 1971-09-14 DOA: 07/07/2019 PCP: Rogers Blocker, MD      Brief Narrative:  [name] is a 48 y.o. M with morbid obesity, OSA, schizophrenia, and HTN who presented to ED on 07/08/2019 with back pain and fever for 1 week duration.  This is started with a small dimple.  No trauma or insect bite reported.  Patient was febrile at 83 F in the emergency department with leukocytosis of 17.3.  CT chest showed possible thymoma and extensive involvement of upper back concerning for cellulitis but no drainable abscess.  Patient was admitted under hospitalist service and was continued on IV antibiotics vancomycin.  CT surgery was consulted for possible thymoma and they recommended outpatient follow-up with Dr. Kipp Brood.  He then developed fluctuating mass, suggesting abscess.  General surgery was consulted and he underwent incision and drainage on 07/10/2019.   Assessment & Plan:  Sepsis secondary to upper right back cellulitis and abscess: Patient has remained afebrile now.  Feels better than yesterday.  Status post incision and drainage of the abscess on 07/10/2019.  Culture is growing fungus as well as gram-positive cocci.  Leukocytosis resolved.  Continue vancomycin and fluconazole per pharmacy recommendations and follow final culture reports and tailor antibiotics.  AKI:  Admitted with Cr 1.1 which has climbed up to 1.5 to maximum.  Resolved now.  Stop IV fluids.  Possible thymoma CT reports incidental finding of "numerous ground glass and soft tissue nodules at location of thymus", possible thymoma or residual thymus. -Consulted CT surgery, appreciate expertise --> needs outpatient follow up with Dr. Kipp Brood for follow up labs, as well as ordering MRI chest and/or scrotal ultrasound  Hypertension BP stable this AM -Continue to hold irbesartan  Diabetes mellitus type II: Per patient, he was always told that he was prediabetic however  his hemoglobin A1c has always been in diabetes range and latest hemoglobin A1c has been 9.5 on 07/08/2019.  Seems to take glipizide at home.  Blood sugar slightly better than yesterday.  Continue current dose of Lantus 35 units and SSI.  Schizophrenia This is a chart history from 2012.   Not on meds.  I was notified by nurse that he has gained 10 pounds overnight.  Looking at the chart, looks like he has gained approximately 40 pounds since admission on 07/07/2019 however patient has no shortness of breath and does not have any edema on lower extremities.  I wonder if this is authentic number.  I have discontinued his IV fluids.  We will recheck in the morning.  DVT prophylaxis: Lovenox Code Status: FULL Family Communication: None present.  Plan of care discussed with patient.  No questions. Disposition plan: Likely home once final culture reports are known.  Potentially tomorrow.  Consultants:   CT surgery  General Surgery  Procedures:   10/11 CT chest -- no abscess  10/13 [place] back -- 2x4 abscess  10/14 operative I&D  Antimicrobials:   Vancomycin 10/11 >>  Clindamycin x1 day  Zosyn x1 day   Fluconazole 07/11/2019>   Subjective: Patient seen and examined.  Overall feels better.  Back pain improved.  No fever.  Concerned about weight gain.  Objective: Vitals:   07/12/19 1205 07/12/19 1954 07/13/19 0627 07/13/19 0839  BP: 134/78 (!) 112/54  (!) 133/91  Pulse: (!) 54 (!) 52  (!) 57  Resp: 18 14  18   Temp: 97.6 F (36.4 C) 98.5 F (36.9 C)  98.2 F (36.8  C)  TempSrc: Oral Oral  Oral  SpO2: 97% 94%  95%  Weight:   (!) 184.8 kg   Height:        Intake/Output Summary (Last 24 hours) at 07/13/2019 1401 Last data filed at 07/13/2019 0800 Gross per 24 hour  Intake 3774.72 ml  Output 1335 ml  Net 2439.72 ml   Filed Weights   07/11/19 0500 07/12/19 0500 07/13/19 0627  Weight: (!) 177.5 kg (!) 180.9 kg (!) 184.8 kg    Examination:  General exam: Appears calm  and comfortable, morbidly obese Respiratory system: Clear to auscultation. Respiratory effort normal. Cardiovascular system: S1 & S2 heard, RRR. No JVD, murmurs, rubs, gallops or clicks. No pedal edema. Gastrointestinal system: Abdomen is nondistended, soft and nontender. No organomegaly or masses felt. Normal bowel sounds heard. Central nervous system: Alert and oriented. No focal neurological deficits. Extremities: Symmetric 5 x 5 power. Skin: No rashes, lesions or ulcers.  Wound VAC attached to the back.  Very minimal erythema around it. Psychiatry: Judgement and insight appear normal. Mood & affect appropriate.    Data Reviewed: I have personally reviewed following labs and imaging studies:  CBC: Recent Labs  Lab 07/07/19 1600 07/08/19 0412 07/09/19 0224 07/10/19 0405 07/11/19 0303 07/12/19 0140 07/13/19 0300  WBC 17.3* 16.2* 17.5* 20.2* 20.9* 15.5* 9.6  NEUTROABS 13.9* 13.4*  --   --   --  11.9* 5.8  HGB 13.4 12.5* 12.6* 11.9* 11.6* 11.0* 10.6*  HCT 41.5 38.0* 37.8* 36.5* 35.8* 34.0* 32.7*  MCV 88.5 87.2 87.9 88.2 88.6 88.8 89.1  PLT 238 212 188 219 248 231 [id number]   Basic Metabolic Panel: Recent Labs  Lab 07/10/19 0405 07/10/19 2310 07/11/19 0303 07/12/19 0140 07/13/19 0300  NA 132* 132* 136 133* 134*  K 3.7 4.2 4.4 4.4 4.1  CL 102 102 105 106 107  CO2 22 20* 21* 20* 20*  GLUCOSE 245* 378* 296* 296* 211*  BUN 10 16 15 18 15   CREATININE 1.44* 1.52* 1.41* 1.26* 1.24  CALCIUM 8.3* 8.1* 8.4* 8.1* 7.9*  MG  --   --   --  1.7  --    GFR: Estimated Creatinine Clearance: 127 mL/min (by C-G formula based on SCr of 1.24 mg/dL). Liver Function Tests: Recent Labs  Lab 07/07/19 1600 07/08/19 0412 07/10/19 2310 07/12/19 0140 07/13/19 0300  AST 14* 13* 19 21 22   ALT 17 18 21 24 24   ALKPHOS 99 92 78 74 71  BILITOT 1.4* 1.1 0.6 0.4 0.6  PROT 8.1 7.3 6.6 6.1* 5.7*  ALBUMIN 3.0* 2.7* 2.1* 2.0* 2.0*   No results for input(s): LIPASE, AMYLASE in the last 168 hours. No  results for input(s): AMMONIA in the last 168 hours. Coagulation Profile: No results for input(s): INR, PROTIME in the last 168 hours. Cardiac Enzymes: Recent Labs  Lab 07/08/19 0412  CKTOTAL 210   BNP (last 3 results) No results for input(s): PROBNP in the last 8760 hours. HbA1C: No results for input(s): HGBA1C in the last 72 hours. CBG: Recent Labs  Lab 07/12/19 1208 07/12/19 1642 07/12/19 2132 07/13/19 0609 07/13/19 1116  GLUCAP 198* 215* 165* 183* 172*   Lipid Profile: No results for input(s): CHOL, HDL, LDLCALC, TRIG, CHOLHDL, LDLDIRECT in the last 72 hours. Thyroid Function Tests: No results for input(s): TSH, T4TOTAL, FREET4, T3FREE, THYROIDAB in the last 72 hours. Anemia Panel: No results for input(s): VITAMINB12, FOLATE, FERRITIN, TIBC, IRON, RETICCTPCT in the last 72 hours. Urine analysis:    Component  Value Date/Time   COLORURINE YELLOW 03/22/2014 0401   APPEARANCEUR CLEAR 03/22/2014 0401   LABSPEC 1.025 03/22/2014 0401   PHURINE 5.5 03/22/2014 0401   GLUCOSEU NEGATIVE 03/22/2014 0401   HGBUR NEGATIVE 03/22/2014 0401   HGBUR small 10/17/2008 0000   BILIRUBINUR neg 08/11/2015 1652   KETONESUR NEGATIVE 03/22/2014 0401   PROTEINUR 3+ 08/11/2015 1652   PROTEINUR >300 (A) 03/22/2014 0401   UROBILINOGEN 0.2 08/11/2015 1652   UROBILINOGEN 0.2 03/22/2014 0401   NITRITE neg 08/11/2015 1652   NITRITE NEGATIVE 03/22/2014 0401   LEUKOCYTESUR Negative 08/11/2015 1652   Sepsis Labs: @LABRCNTIP (procalcitonin:4,lacticacidven:4)  ) Recent Results (from the past 240 hour(s))  SARS CORONAVIRUS 2 (TAT 6-24 HRS) Nasopharyngeal Nasopharyngeal Swab     Status: None   Collection Time: 07/07/19  6:05 PM   Specimen: Nasopharyngeal Swab  Result Value Ref Range Status   SARS Coronavirus 2 NEGATIVE NEGATIVE Final    Comment: (NOTE) SARS-CoV-2 target nucleic acids are NOT DETECTED. The SARS-CoV-2 RNA is generally detectable in upper and lower respiratory specimens during  the acute phase of infection. Negative results do not preclude SARS-CoV-2 infection, do not rule out co-infections with other pathogens, and should not be used as the sole basis for treatment or other patient management decisions. Negative results must be combined with clinical observations, patient history, and epidemiological information. The expected result is Negative. Fact Sheet for Patients: SugarRoll.be Fact Sheet for Healthcare Providers: https://www.woods-mathews.com/ This test is not yet approved or cleared by the [place] FDA and  has been authorized for detection and/or diagnosis of SARS-CoV-2 by FDA under an Emergency Use Authorization (EUA). This EUA will remain  in effect (meaning this test can be used) for the duration of the COVID-19 declaration under Section 56 4(b)(1) of the Act, 21 U.S.C. section 360bbb-3(b)(1), unless the authorization is terminated or revoked sooner. Performed at [facility], [place] [address] [place], [place] [address]   Culture, blood (routine x 2)     Status: None   Collection Time: 07/08/19  8:46 AM   Specimen: BLOOD LEFT HAND  Result Value Ref Range Status   Specimen Description BLOOD LEFT HAND  Final   Special Requests   Final    BOTTLES DRAWN AEROBIC ONLY Blood Culture results may not be optimal due to an inadequate volume of blood received in culture bottles   Culture   Final    NO GROWTH 5 DAYS Performed at [facility], [place] [address], [place], [place] [address]    Report Status 07/13/2019 FINAL  Final  Culture, blood (routine x 2)     Status: None   Collection Time: 07/08/19  8:51 AM   Specimen: BLOOD RIGHT HAND  Result Value Ref Range Status   Specimen Description BLOOD RIGHT HAND  Final   Special Requests   Final    BOTTLES DRAWN AEROBIC AND ANAEROBIC Blood Culture adequate volume   Culture   Final    NO GROWTH 5 DAYS Performed at [facility], [place] [address], [place],  [address]    Report Status 07/13/2019 FINAL  Final  Surgical PCR screen     Status: None   Collection Time: 07/10/19  1:31 PM   Specimen: Nasal Mucosa; Nasal Swab  Result Value Ref Range Status   MRSA, PCR NEGATIVE NEGATIVE Final   Staphylococcus aureus NEGATIVE NEGATIVE Final    Comment: (NOTE) The Xpert SA Assay (FDA approved for NASAL specimens in patients 22 years of  age and older), is one component of a comprehensive surveillance program. It is not intended to diagnose infection nor to guide or monitor treatment. Performed at [facility], [place] [address], [place], [place] [address]   Aerobic/Anaerobic Culture (surgical/deep wound)     Status: None (Preliminary result)   Collection Time: 07/10/19  3:01 PM   Specimen: PATH Other; Tissue  Result Value Ref Range Status   Specimen Description ABSCESS BACK  Final   Special Requests PT ON VANC  Final   Gram Stain   Final    FEW WBC PRESENT, PREDOMINANTLY PMN FEW GRAM POSITIVE COCCI IN PAIRS IN CHAINS RARE YEAST    Culture   Final    NO GROWTH 2 DAYS Performed at [facility], [place] [address] [place], [place] [address]    Report Status PENDING  Incomplete     Radiology Studies: No results found.  Scheduled Meds: . enoxaparin (LOVENOX) injection  90 mg Subcutaneous Q24H  . fluconazole  400 mg Oral Daily  . insulin aspart  0-20 Units Subcutaneous TID WC  . insulin aspart  0-5 Units Subcutaneous QHS  . insulin aspart  4 Units Subcutaneous TID WC  . insulin glargine  35 Units Subcutaneous Daily  . rosuvastatin  10 mg Oral Daily  . tamsulosin  0.4 mg Oral QHS   Continuous Infusions: . sodium chloride Stopped (07/11/19 0148)  . 0.9 % sodium chloride with kcl 100 mL/hr at 07/13/19 0333  . vancomycin 1,750 mg (07/13/19 1250)     LOS: 6 days   Time spent: 29 minutes  Darliss Cheney, MD [facility] 07/13/2019, 2:01 PM   Please page through [place]:  [email or web address] Password [email or web address]  7PM-7AM, please contact night-coverage

## 2019-07-14 LAB — COMPREHENSIVE METABOLIC PANEL
ALT: 25 U/L (ref 0–44)
AST: 19 U/L (ref 15–41)
Albumin: 2.2 g/dL — ABNORMAL LOW (ref 3.5–5.0)
Alkaline Phosphatase: 70 U/L (ref 38–126)
Anion gap: 11 (ref 5–15)
BUN: 11 mg/dL (ref 6–20)
CO2: 21 mmol/L — ABNORMAL LOW (ref 22–32)
Calcium: 8 mg/dL — ABNORMAL LOW (ref 8.9–10.3)
Chloride: 104 mmol/L (ref 98–111)
Creatinine, Ser: 1.23 mg/dL (ref 0.61–1.24)
GFR calc Af Amer: >60 mL/min (ref >60)
GFR calc non Af Amer: >60 mL/min (ref >60)
Glucose, Bld: 156 mg/dL — ABNORMAL HIGH (ref 70–99)
Potassium: 3.9 mmol/L (ref 3.5–5.1)
Sodium: 136 mmol/L (ref 135–145)
Total Bilirubin: 0.3 mg/dL (ref 0.3–1.2)
Total Protein: 6.2 g/dL — ABNORMAL LOW (ref 6.5–8.1)

## 2019-07-14 LAB — CBC WITH DIFFERENTIAL/PLATELET
Abs Immature Granulocytes: 0.12 10*3/uL — ABNORMAL HIGH (ref 0.00–0.07)
Basophils Absolute: 0.1 10*3/uL (ref 0.0–0.1)
Basophils Relative: 1 %
Eosinophils Absolute: 0.6 10*3/uL — ABNORMAL HIGH (ref 0.0–0.5)
Eosinophils Relative: 6 %
HCT: 32 % — ABNORMAL LOW (ref 39.0–52.0)
Hemoglobin: 10.1 g/dL — ABNORMAL LOW (ref 13.0–17.0)
Immature Granulocytes: 1 %
Lymphocytes Relative: 18 %
Lymphs Abs: 2 10*3/uL (ref 0.7–4.0)
MCH: 28.3 pg (ref 26.0–34.0)
MCHC: 31.6 g/dL (ref 30.0–36.0)
MCV: 89.6 fL (ref 80.0–100.0)
Monocytes Absolute: 1 10*3/uL (ref 0.1–1.0)
Monocytes Relative: 9 %
Neutro Abs: 7.1 10*3/uL (ref 1.7–7.7)
Neutrophils Relative %: 65 %
Platelets: 259 10*3/uL (ref 150–400)
RBC: 3.57 MIL/uL — ABNORMAL LOW (ref 4.22–5.81)
RDW: 13 % (ref 11.5–15.5)
WBC: 10.9 10*3/uL — ABNORMAL HIGH (ref 4.0–10.5)
nRBC: 0 % (ref 0.0–0.2)

## 2019-07-14 LAB — GLUCOSE, CAPILLARY
Glucose-Capillary: 131 mg/dL — ABNORMAL HIGH (ref 70–99)
Glucose-Capillary: 122 mg/dL — ABNORMAL HIGH (ref 70–99)
Glucose-Capillary: 152 mg/dL — ABNORMAL HIGH (ref 70–99)
Glucose-Capillary: 154 mg/dL — ABNORMAL HIGH (ref 70–99)

## 2019-07-14 MED ORDER — FUROSEMIDE 10 MG/ML IJ SOLN
40.0000 mg | Freq: Once | INTRAMUSCULAR | Status: AC
  Administered 2019-07-14: 40 mg via INTRAVENOUS
  Filled 2019-07-14: qty 4

## 2019-07-14 MED ORDER — SULFAMETHOXAZOLE-TRIMETHOPRIM 800-160 MG PO TABS
1.0000 | ORAL_TABLET | Freq: Two times a day (BID) | ORAL | Status: DC
  Administered 2019-07-14 – 2019-07-15 (×2): 1 via ORAL
  Filled 2019-07-14 (×2): qty 1

## 2019-07-14 NOTE — Progress Notes (Signed)
Pt states he can place himself on CPAP. RT instructed him to call if he had any questions or needed assistance.

## 2019-07-14 NOTE — Progress Notes (Signed)
PROGRESS NOTE    RENAULT NILE  [id number] DOB: 27-Apr-1971 DOA: 07/07/2019 PCP: Rogers Blocker, MD      Brief Narrative:  Mr. Cappo is a 48 y.o. M with morbid obesity, OSA, schizophrenia, and HTN who presented to ED on 07/08/2019 with back pain and fever for 1 week duration.  This is started with a small dimple.  No trauma or insect bite reported.  Patient was febrile at 66 F in the emergency department with leukocytosis of 17.3.  CT chest showed possible thymoma and extensive involvement of upper back concerning for cellulitis but no drainable abscess.  Patient was admitted under hospitalist service and was continued on IV antibiotics vancomycin.  CT surgery was consulted for possible thymoma and they recommended outpatient follow-up with Dr. Kipp Brood.  He then developed fluctuating mass, suggesting abscess.  General surgery was consulted and he underwent incision and drainage on 07/10/2019.   Assessment & Plan:  Sepsis secondary to upper right back cellulitis and abscess: Patient remains afebrile for last few days.  Continues to feel better.  Some jump in white cells today.  Status post incision and drainage of the abscess on 07/10/2019.  Culture is growing fungus as well as gram-positive cocci.  Final culture results are still not available.  Continue vancomycin and fluconazole per pharmacy recommendations and follow final culture reports and tailor antibiotics.  AKI:  Admitted with Cr 1.1 which has climbed up to 1.5 to maximum.  Resolved now. .  Possible thymoma CT reports incidental finding of "numerous ground glass and soft tissue nodules at location of thymus", possible thymoma or residual thymus. -Consulted CT surgery, appreciate expertise --> needs outpatient follow up with Dr. Kipp Brood for follow up labs, as well as ordering MRI chest and/or scrotal ultrasound  Hypertension BP stable this AM -Continue to hold irbesartan  Diabetes mellitus type II: Per patient, he  was always told that he was prediabetic however his hemoglobin A1c has always been in diabetes range and latest hemoglobin A1c has been 9.5 on 07/08/2019.  Seems to take glipizide at home.  Blood sugar very well controlled.  Continue Lantus 35 units and SSI.   Schizophrenia This is a chart history from 2012.   Not on meds.  Weight gain during hospitalization and bilateral lower extremity edema : Per chart review, patient has gained approximately 40 pounds since admission, not sure how much accurate charting is.  He did not have any crackles on lung exam today or yesterday.  He did not have any edema yesterday however he has +1 edema bilateral lower extremity today.  We will give him a trial of Lasix 40 mg IV x1 today.     DVT prophylaxis: Lovenox Code Status: FULL Family Communication: None present.  Plan of care discussed with patient.  No questions. Disposition plan: Likely home once final culture reports are known.  Needs wound VAC arranged.  Per case manager, arrangements will likely be done tomorrow.  Potential discharge tomorrow.  Consultants:   CT surgery  General Surgery  Procedures:   10/11 CT chest -- no abscess  10/13 [place] back -- 2x4 abscess  10/14 operative I&D  Antimicrobials:   Vancomycin 10/11 >>  Clindamycin x1 day  Zosyn x1 day   Fluconazole 07/11/2019>   Subjective: Patient seen and examined.  Continues to feel better.  No new complaint.  Objective: Vitals:   07/13/19 0839 07/13/19 2001 07/14/19 0446 07/14/19 0449  BP: (!) 133/91 131/81 139/66 139/66  Pulse: (!) 57 Marland Kitchen)  101 (!) 43 (!) 47  Resp: 18 18 17    Temp: 98.2 F (36.8 C) 98.4 F (36.9 C) 98.2 F (36.8 C)   TempSrc: Oral Oral Oral   SpO2: 95% 98% 97% 97%  Weight:    (!) 185.6 kg  Height:        Intake/Output Summary (Last 24 hours) at 07/14/2019 0823 Last data filed at 07/14/2019 [id number] Gross per 24 hour  Intake 1420 ml  Output 3000 ml  Net -1580 ml   Filed Weights   07/12/19 0500  07/13/19 0627 07/14/19 0449  Weight: (!) 180.9 kg (!) 184.8 kg (!) 185.6 kg    Examination:  General exam: Appears calm and comfortable, morbidly obese Respiratory system: Clear to auscultation. Respiratory effort normal. Cardiovascular system: S1 & S2 heard, RRR. No JVD, murmurs, rubs, gallops or clicks.  +1 pitting edema bilateral lower extremity Gastrointestinal system: Abdomen is nondistended, soft and nontender. No organomegaly or masses felt. Normal bowel sounds heard. Central nervous system: Alert and oriented. No focal neurological deficits. Extremities: Symmetric 5 x 5 power. Skin: No rashes, lesions or ulcers.  Has wound VAC attached in the upper right back.  No erythema. Psychiatry: Judgement and insight appear normal. Mood & affect appropriate.    Data Reviewed: I have personally reviewed following labs and imaging studies:  CBC: Recent Labs  Lab 07/07/19 1600 07/08/19 0412  07/10/19 0405 07/11/19 0303 07/12/19 0140 07/13/19 0300 07/14/19 0408  WBC 17.3* 16.2*   < > 20.2* 20.9* 15.5* 9.6 10.9*  NEUTROABS 13.9* 13.4*  --   --   --  11.9* 5.8 7.1  HGB 13.4 12.5*   < > 11.9* 11.6* 11.0* 10.6* 10.1*  HCT 41.5 38.0*   < > 36.5* 35.8* 34.0* 32.7* 32.0*  MCV 88.5 87.2   < > 88.2 88.6 88.8 89.1 89.6  PLT 238 212   < > 219 248 231 222 259   < > = values in this interval not displayed.   Basic Metabolic Panel: Recent Labs  Lab 07/10/19 2310 07/11/19 0303 07/12/19 0140 07/13/19 0300 07/14/19 0408  NA 132* 136 133* 134* 136  K 4.2 4.4 4.4 4.1 3.9  CL 102 105 106 107 104  CO2 20* 21* 20* 20* 21*  GLUCOSE 378* 296* 296* 211* 156*  BUN 16 15 18 15 11   CREATININE 1.52* 1.41* 1.26* 1.24 1.23  CALCIUM 8.1* 8.4* 8.1* 7.9* 8.0*  MG  --   --  1.7  --   --    GFR: Estimated Creatinine Clearance: 128.3 mL/min (by C-G formula based on SCr of 1.23 mg/dL). Liver Function Tests: Recent Labs  Lab 07/08/19 0412 07/10/19 2310 07/12/19 0140 07/13/19 0300 07/14/19 0408   AST 13* 19 21 22 19   ALT 18 21 24 24 25   ALKPHOS 92 78 74 71 70  BILITOT 1.1 0.6 0.4 0.6 0.3  PROT 7.3 6.6 6.1* 5.7* 6.2*  ALBUMIN 2.7* 2.1* 2.0* 2.0* 2.2*   No results for input(s): LIPASE, AMYLASE in the last 168 hours. No results for input(s): AMMONIA in the last 168 hours. Coagulation Profile: No results for input(s): INR, PROTIME in the last 168 hours. Cardiac Enzymes: Recent Labs  Lab 07/08/19 0412  CKTOTAL 210   BNP (last 3 results) No results for input(s): PROBNP in the last 8760 hours. HbA1C: No results for input(s): HGBA1C in the last 72 hours. CBG: Recent Labs  Lab 07/13/19 0609 07/13/19 1116 07/13/19 1637 07/13/19 2151 07/14/19 0617  GLUCAP 183* 172*  133* 172* 131*   Lipid Profile: No results for input(s): CHOL, HDL, LDLCALC, TRIG, CHOLHDL, LDLDIRECT in the last 72 hours. Thyroid Function Tests: No results for input(s): TSH, T4TOTAL, FREET4, T3FREE, THYROIDAB in the last 72 hours. Anemia Panel: No results for input(s): VITAMINB12, FOLATE, FERRITIN, TIBC, IRON, RETICCTPCT in the last 72 hours. Urine analysis:    Component Value Date/Time   COLORURINE YELLOW 03/22/2014 0401   APPEARANCEUR CLEAR 03/22/2014 0401   LABSPEC 1.025 03/22/2014 0401   PHURINE 5.5 03/22/2014 0401   GLUCOSEU NEGATIVE 03/22/2014 0401   HGBUR NEGATIVE 03/22/2014 0401   HGBUR small 10/17/2008 0000   BILIRUBINUR neg 08/11/2015 1652   KETONESUR NEGATIVE 03/22/2014 0401   PROTEINUR 3+ 08/11/2015 1652   PROTEINUR >300 (A) 03/22/2014 0401   UROBILINOGEN 0.2 08/11/2015 1652   UROBILINOGEN 0.2 03/22/2014 0401   NITRITE neg 08/11/2015 1652   NITRITE NEGATIVE 03/22/2014 0401   LEUKOCYTESUR Negative 08/11/2015 1652   Sepsis Labs: @LABRCNTIP (procalcitonin:4,lacticacidven:4)  ) Recent Results (from the past 240 hour(s))  SARS CORONAVIRUS 2 (TAT 6-24 HRS) Nasopharyngeal Nasopharyngeal Swab     Status: None   Collection Time: 07/07/19  6:05 PM   Specimen: Nasopharyngeal Swab  Result  Value Ref Range Status   SARS Coronavirus 2 NEGATIVE NEGATIVE Final    Comment: (NOTE) SARS-CoV-2 target nucleic acids are NOT DETECTED. The SARS-CoV-2 RNA is generally detectable in upper and lower respiratory specimens during the acute phase of infection. Negative results do not preclude SARS-CoV-2 infection, do not rule out co-infections with other pathogens, and should not be used as the sole basis for treatment or other patient management decisions. Negative results must be combined with clinical observations, patient history, and epidemiological information. The expected result is Negative. Fact Sheet for Patients: SugarRoll.be Fact Sheet for Healthcare Providers: https://www.woods-mathews.com/ This test is not yet approved or cleared by the [place] FDA and  has been authorized for detection and/or diagnosis of SARS-CoV-2 by FDA under an Emergency Use Authorization (EUA). This EUA will remain  in effect (meaning this test can be used) for the duration of the COVID-19 declaration under Section 56 4(b)(1) of the Act, 21 U.S.C. section 360bbb-3(b)(1), unless the authorization is terminated or revoked sooner. Performed at [facility], [place] [address], [place], [place] [address]   Culture, blood (routine x 2)     Status: None   Collection Time: 07/08/19  8:46 AM   Specimen: BLOOD LEFT HAND  Result Value Ref Range Status   Specimen Description BLOOD LEFT HAND  Final   Special Requests   Final    BOTTLES DRAWN AEROBIC ONLY Blood Culture results may not be optimal due to an inadequate volume of blood received in culture bottles   Culture   Final    NO GROWTH 5 DAYS Performed at [facility], [place] [address], [place], [place] [address]    Report Status 07/13/2019 FINAL  Final  Culture, blood (routine x 2)     Status: None   Collection Time: 07/08/19  8:51 AM   Specimen: BLOOD RIGHT HAND  Result Value Ref Range Status    Specimen Description BLOOD RIGHT HAND  Final   Special Requests   Final    BOTTLES DRAWN AEROBIC AND ANAEROBIC Blood Culture adequate volume   Culture   Final    NO GROWTH 5 DAYS Performed at [facility], [place] [address] [place], [place] [address]    Report Status 07/13/2019 FINAL  Final  Surgical  PCR screen     Status: None   Collection Time: 07/10/19  1:31 PM   Specimen: Nasal Mucosa; Nasal Swab  Result Value Ref Range Status   MRSA, PCR NEGATIVE NEGATIVE Final   Staphylococcus aureus NEGATIVE NEGATIVE Final    Comment: (NOTE) The Xpert SA Assay (FDA approved for NASAL specimens in patients 24 years of age and older), is one component of a comprehensive surveillance program. It is not intended to diagnose infection nor to guide or monitor treatment. Performed at [facility], [place] [address], [place], [place] [address]   Aerobic/Anaerobic Culture (surgical/deep wound)     Status: None (Preliminary result)   Collection Time: 07/10/19  3:01 PM   Specimen: PATH Other; Tissue  Result Value Ref Range Status   Specimen Description ABSCESS BACK  Final   Special Requests PT ON VANC  Final   Gram Stain   Final    FEW WBC PRESENT, PREDOMINANTLY PMN FEW GRAM POSITIVE COCCI IN PAIRS IN CHAINS RARE YEAST    Culture   Final    CULTURE REINCUBATED FOR BETTER GROWTH Performed at [facility], [place] [address], [place], [place] [address]    Report Status PENDING  Incomplete     Radiology Studies: No results found.  Scheduled Meds: . enoxaparin (LOVENOX) injection  90 mg Subcutaneous Q24H  . fluconazole  400 mg Oral Daily  . insulin aspart  0-20 Units Subcutaneous TID WC  . insulin aspart  0-5 Units Subcutaneous QHS  . insulin aspart  4 Units Subcutaneous TID WC  . insulin glargine  35 Units Subcutaneous Daily  . rosuvastatin  10 mg Oral Daily  . tamsulosin  0.4 mg Oral QHS   Continuous Infusions: . vancomycin 1,750 mg (07/14/19 0027)     LOS: 7 days   Time  spent: 28 minutes  Darliss Cheney, MD [facility] 07/14/2019, 8:23 AM   Please page through [place]:  [email or web address] Password [email or web address] If 7PM-7AM, please contact night-coverage

## 2019-07-14 NOTE — Progress Notes (Signed)
Wound vac canister marked at 140 ml since placed on 07/12/19 at 1915. Thanks. Pt resting with call bell within reach.  Will continue to monitor. Payton Emerald, RN

## 2019-07-15 DIAGNOSIS — E119 Type 2 diabetes mellitus without complications: Secondary | ICD-10-CM

## 2019-07-15 DIAGNOSIS — B9689 Other specified bacterial agents as the cause of diseases classified elsewhere: Secondary | ICD-10-CM

## 2019-07-15 DIAGNOSIS — B957 Other staphylococcus as the cause of diseases classified elsewhere: Secondary | ICD-10-CM

## 2019-07-15 DIAGNOSIS — Z978 Presence of other specified devices: Secondary | ICD-10-CM

## 2019-07-15 DIAGNOSIS — L02212 Cutaneous abscess of back [any part, except buttock]: Secondary | ICD-10-CM

## 2019-07-15 DIAGNOSIS — I10 Essential (primary) hypertension: Secondary | ICD-10-CM

## 2019-07-15 DIAGNOSIS — Z91018 Allergy to other foods: Secondary | ICD-10-CM

## 2019-07-15 DIAGNOSIS — E785 Hyperlipidemia, unspecified: Secondary | ICD-10-CM

## 2019-07-15 DIAGNOSIS — Z87891 Personal history of nicotine dependence: Secondary | ICD-10-CM

## 2019-07-15 LAB — CBC WITH DIFFERENTIAL/PLATELET
Abs Immature Granulocytes: 0.07 10*3/uL (ref 0.00–0.07)
Basophils Absolute: 0.1 10*3/uL (ref 0.0–0.1)
Basophils Relative: 1 %
Eosinophils Absolute: 0.4 10*3/uL (ref 0.0–0.5)
Eosinophils Relative: 5 %
HCT: 34.7 % — ABNORMAL LOW (ref 39.0–52.0)
Hemoglobin: 11.1 g/dL — ABNORMAL LOW (ref 13.0–17.0)
Immature Granulocytes: 1 %
Lymphocytes Relative: 21 %
Lymphs Abs: 1.9 10*3/uL (ref 0.7–4.0)
MCH: 28.2 pg (ref 26.0–34.0)
MCHC: 32 g/dL (ref 30.0–36.0)
MCV: 88.1 fL (ref 80.0–100.0)
Monocytes Absolute: 0.7 10*3/uL (ref 0.1–1.0)
Monocytes Relative: 8 %
Neutro Abs: 5.7 10*3/uL (ref 1.7–7.7)
Neutrophils Relative %: 64 %
Platelets: 283 10*3/uL (ref 150–400)
RBC: 3.94 MIL/uL — ABNORMAL LOW (ref 4.22–5.81)
RDW: 13 % (ref 11.5–15.5)
WBC: 8.8 10*3/uL (ref 4.0–10.5)
nRBC: 0 % (ref 0.0–0.2)

## 2019-07-15 LAB — GLUCOSE, CAPILLARY
Glucose-Capillary: 131 mg/dL — ABNORMAL HIGH (ref 70–99)
Glucose-Capillary: 129 mg/dL — ABNORMAL HIGH (ref 70–99)
Glucose-Capillary: 129 mg/dL — ABNORMAL HIGH (ref 70–99)
Glucose-Capillary: 163 mg/dL — ABNORMAL HIGH (ref 70–99)

## 2019-07-15 MED ORDER — FUROSEMIDE 10 MG/ML IJ SOLN
80.0000 mg | Freq: Once | INTRAMUSCULAR | Status: AC
  Administered 2019-07-15: 80 mg via INTRAVENOUS
  Filled 2019-07-15: qty 8

## 2019-07-15 MED ORDER — LINEZOLID 600 MG PO TABS
600.0000 mg | ORAL_TABLET | Freq: Two times a day (BID) | ORAL | Status: DC
  Administered 2019-07-15 – 2019-07-16 (×2): 600 mg via ORAL
  Filled 2019-07-15 (×3): qty 1

## 2019-07-15 NOTE — Care Management Important Message (Signed)
Important Message  Patient Details  Name: TIREK HAGEDORN MRN: [id number] Date of Birth: July 18, 1971   Medicare Important Message Given:  Yes     [name] 07/15/2019, 1:23 PM

## 2019-07-15 NOTE — Progress Notes (Addendum)
Patient wife and daughter came for education on wound care of changing dressing wet to dry twice a day. Reviewed steps/process of dressing change with patient wife and daughter. Patient daughter assisted this RN with changing dressing wet to dry. Wife observed dressing change.  All questions answered at this time. Will monitor patient. Tylyn Stankovich, Bettina Gavia rN

## 2019-07-15 NOTE — Progress Notes (Addendum)
Still working with Christella Scheuermann to secure [facility] agency for [facility] wound care needs- have spoken to multiple [facility] agencies regarding [place] needs and contracting with [employer] - per pt he does not have a preference for [facility] agency-  [facility]- no [facility]- no [facility]- no [facility]- no [facility]- no [facility]- no [facility]- no [employer]- accepted referral  Per [place] - contract needs to go through [place] division- [phone])- to call for pre-cert/auth the number is- [phone]

## 2019-07-15 NOTE — Progress Notes (Signed)
[facility]uma Progress Note  5 Days Post-Op   Assessment/Plan Back abscess S/p I&D 07/10/19 Dr. Georgette Dover - wound doesn't look as clean as it should for a vac. Wet to dry dressing changes - cxs showed staph lugdunensis, rare yeast - follow up with [facility] in 2 weeks  - okay for discharge from as surgical standpoint - antibiotics per medicine    LOS: 8 days    Subjective: CC: back pain  Pt denies any issues and is ready to go home.   Objective: Vital signs in last 24 hours: Temp:  [98 F (36.7 C)-99.2 F (37.3 C)] 98.4 F (36.9 C) (10/19 0809) Pulse Rate:  [66-68] 68 (10/19 0809) BP: (117-142)/(50-94) 117/75 (10/19 0809) SpO2:  [95 %-99 %] 97 % (10/19 0809) Weight:  [179.4 kg] 179.4 kg (10/19 0500) Last BM Date: 07/14/19  Intake/Output from previous day: 10/18 0701 - 10/19 0700 In: 17 [P.O.:960] Out: 8105 [Urine:8025; Drains:80] Intake/Output this shift: Total I/O In: 120 [P.O.:120] Out: -   PE: Gen:  Alert, NAD, pleasant, cooperative Back: see photo below. Wound has white film covering about 50% of wound, no foul odor, no purulent drainage noted Pulm:  Rate and effort normal Skin: warm and dry      Anti-infectives: Anti-infectives (From admission, onward)   Start     Dose/Rate Route Frequency Ordered Stop   07/15/19 1800  linezolid (ZYVOX) tablet 600 mg     600 mg Oral Every 12 hours 07/15/19 1036     07/14/19 2200  sulfamethoxazole-trimethoprim (BACTRIM DS) 800-160 MG per tablet 1 tablet  Status:  Discontinued     1 tablet Oral Every 12 hours 07/14/19 1430 07/15/19 1036   07/12/19 1230  vancomycin (VANCOCIN) 1,750 mg in sodium chloride 0.9 % 500 mL IVPB     1,750 mg 250 mL/hr over 120 Minutes Intravenous Every 12 hours 07/12/19 1107 07/14/19 1608   07/11/19 1200  fluconazole (DIFLUCAN) tablet 400 mg     400 mg Oral Daily 07/11/19 1056     07/10/19 1230  vancomycin (VANCOCIN) 1,500 mg in sodium chloride 0.9 % 500 mL IVPB  Status:   Discontinued     1,500 mg 250 mL/hr over 120 Minutes Intravenous Every 12 hours 07/10/19 1149 07/12/19 1107   07/09/19 2330  vancomycin (VANCOCIN) 1,750 mg in sodium chloride 0.9 % 500 mL IVPB  Status:  Discontinued     1,750 mg 250 mL/hr over 120 Minutes Intravenous Every 12 hours 07/09/19 1033 07/10/19 1146   07/09/19 1130  vancomycin (VANCOCIN) 2,500 mg in sodium chloride 0.9 % 500 mL IVPB     2,500 mg 250 mL/hr over 120 Minutes Intravenous  Once 07/09/19 1033 07/09/19 1405   07/08/19 1400  clindamycin (CLEOCIN) IVPB 600 mg  Status:  Discontinued     600 mg 100 mL/hr over 30 Minutes Intravenous Every 8 hours 07/08/19 1016 07/09/19 1003   07/08/19 1100  vancomycin (VANCOCIN) 1,750 mg in sodium chloride 0.9 % 500 mL IVPB  Status:  Discontinued     1,750 mg 250 mL/hr over 120 Minutes Intravenous Every 12 hours 07/07/19 2037 07/08/19 1016   07/08/19 0500  piperacillin-tazobactam (ZOSYN) IVPB 3.375 g  Status:  Discontinued     3.375 g 12.5 mL/hr over 240 Minutes Intravenous Every 8 hours 07/07/19 2032 07/08/19 1016   07/07/19 2045  piperacillin-tazobactam (ZOSYN) IVPB 3.375 g     3.375 g 100 mL/hr over 30 Minutes Intravenous  Once 07/07/19 2032 07/07/19 2211   07/07/19  2045  vancomycin (VANCOCIN) 2,500 mg in sodium chloride 0.9 % 500 mL IVPB     2,500 mg 250 mL/hr over 120 Minutes Intravenous  Once 07/07/19 2032 07/08/19 0104   07/07/19 1715  clindamycin (CLEOCIN) IVPB 600 mg     600 mg 100 mL/hr over 30 Minutes Intravenous  Once 07/07/19 1711 07/07/19 1800      Lab Results:  Recent Labs    07/14/19 0408 07/15/19 0407  WBC 10.9* 8.8  HGB 10.1* 11.1*  HCT 32.0* 34.7*  PLT 259 283   BMET Recent Labs    07/13/19 0300 07/14/19 0408  NA 134* 136  K 4.1 3.9  CL 107 104  CO2 20* 21*  GLUCOSE 211* 156*  BUN 15 11  CREATININE 1.24 1.23  CALCIUM 7.9* 8.0*   PT/INR No results for input(s): LABPROT, INR in the last 72 hours. CMP     Component Value Date/Time   NA 136  07/14/2019 0408   K 3.9 07/14/2019 0408   CL 104 07/14/2019 0408   CO2 21 (L) 07/14/2019 0408   GLUCOSE 156 (H) 07/14/2019 0408   GLUCOSE 123 (H) 08/11/2006 1127   BUN 11 07/14/2019 0408   CREATININE 1.23 07/14/2019 0408   CALCIUM 8.0 (L) 07/14/2019 0408   PROT 6.2 (L) 07/14/2019 0408   ALBUMIN 2.2 (L) 07/14/2019 0408   AST 19 07/14/2019 0408   ALT 25 07/14/2019 0408   ALKPHOS 70 07/14/2019 0408   BILITOT 0.3 07/14/2019 0408   GFRNONAA >60 07/14/2019 0408   GFRAA >60 07/14/2019 0408   Lipase     Component Value Date/Time   LIPASE 13 09/06/2013 1950    Studies/Results: No results found.   Kalman Drape, [facility] Surgery Pager [phone] Cristine Polio, & Friday 7:00am - 4:30pm Thursdays 7:00am -11:30am

## 2019-07-15 NOTE — Consult Note (Signed)
[facility]  Total days of antibiotics 9               Reason for Consult: deep tissue infection, polymicrobial   Referring Physician: pahwani  Principal Problem:   Sepsis ([place]) Active Problems:   OBSTRUCTIVE SLEEP APNEA   HTN (hypertension)   Hyperglycemia    HPI: [name] is a 48 y.o. male with hx of Type 2 DM, HTN, HLD who started to notice tender nodule on upper back in between shoulder blades which became increasingly tender. On admit, he was found to be febrile up to 101-103F, leukocytosis of 16-20K. He was started on piptazo/vanco. He underwent I x D on 10/14 by Dr Molli Posey --Large subcutaneous abscess of the back (12 x 12 x 8 cm deep). Patient now has wound vac in place. ID consultation asked to weigh in on abtx management  Past Medical History:  Diagnosis Date  . Ankle fracture, right   . Bell's palsy   . Bipolar affective disorder ([place])   . Bipolar disorder ([place])   . Diabetes mellitus   . Gout   . Hyperlipidemia    Per pt - reported on 03/07/12  . Hypertension   . Obesity   . OSA (obstructive sleep apnea)   . Sleep apnea     Allergies:  Allergies  Allergen Reactions  . Cucumber Extract Shortness Of Breath    MEDICATIONS: . enoxaparin (LOVENOX) injection  90 mg Subcutaneous Q24H  . fluconazole  400 mg Oral Daily  . insulin aspart  0-20 Units Subcutaneous TID WC  . insulin aspart  0-5 Units Subcutaneous QHS  . insulin aspart  4 Units Subcutaneous TID WC  . insulin glargine  35 Units Subcutaneous Daily  . linezolid  600 mg Oral Q12H  . rosuvastatin  10 mg Oral Daily  . tamsulosin  0.4 mg Oral QHS    Social History   Tobacco Use  . Smoking status: Former Smoker    Packs/day: 0.50    Years: 25.00    Pack years: 12.50    Types: Cigarettes    Quit date: 04/18/2019    Years since quitting: 0.2  . Smokeless tobacco: Never Used  . Tobacco comment: stopped 04/18/19  Substance Use Topics  . Alcohol use: No  . Drug use: No     Family History  Problem Relation Age of Onset  . Hypertension Mother   . Diabetes Mother   . Hyperlipidemia Mother   . Pancreatitis Father   . Arthritis Father   . Heart attack Brother        34  . Heart disease Brother        MI  . Heart attack Maternal Grandmother   . Cancer Maternal Grandmother        breast  . Kidney disease Sister        dialysis  . Lupus Sister   . Stroke Sister   . Diabetes Maternal Aunt   . Heart disease Maternal Aunt   . Diabetes Paternal Aunt   . Heart disease Paternal Aunt        pacemaker  . Heart disease Paternal Uncle        pacemaker  . Diabetes Paternal Grandmother   . Stroke Paternal Aunt   . COPD Maternal Aunt   . Heart disease Maternal Aunt   . Throat cancer Other        aunt  . Kidney failure Other  Review of Systems  Constitutional: Negative for fever, chills, diaphoresis, activity change, appetite change, fatigue and unexpected weight change.  HENT: Negative for congestion, sore throat, rhinorrhea, sneezing, trouble swallowing and sinus pressure.  Eyes: Negative for photophobia and visual disturbance.  Respiratory: Negative for cough, chest tightness, shortness of breath, wheezing and stridor.  Cardiovascular: Negative for chest pain, palpitations and leg swelling.  Gastrointestinal: Negative for nausea, vomiting, abdominal pain, diarrhea, constipation, blood in stool, abdominal distention and anal bleeding.  Genitourinary: Negative for dysuria, hematuria, flank pain and difficulty urinating.  Musculoskeletal: Negative for myalgias, back pain, joint swelling, arthralgias and gait problem.  Skin: + back wound Neurological: Negative for dizziness, tremors, weakness and light-headedness.  Hematological: Negative for adenopathy. Does not bruise/bleed easily.  Psychiatric/Behavioral: Negative for behavioral problems, confusion, sleep disturbance, dysphoric mood, decreased concentration and agitation.     OBJECTIVE: Temp:   [98 F (36.7 C)-99.2 F (37.3 C)] 98.4 F (36.9 C) (10/19 0809) Pulse Rate:  [66-68] 68 (10/19 0809) BP: (117-142)/(50-94) 117/75 (10/19 0809) SpO2:  [95 %-99 %] 97 % (10/19 0809) Weight:  [179.4 kg] 179.4 kg (10/19 0500) Physical Exam  Constitutional: He is oriented to person, place, and time. He appears well-developed and well-nourished, overweight. No distress.  HENT:  Mouth/Throat: Oropharynx is clear and moist. No oropharyngeal exudate.  Cardiovascular: Normal rate, regular rhythm and normal heart sounds. Exam reveals no gallop and no friction rub.  No murmur heard.  Pulmonary/Chest: Effort normal and breath sounds normal. No respiratory distress. He has no wheezes.  Abdominal: Soft. Bowel sounds are normal. He exhibits no distension. There is no tenderness.  Lymphadenopathy:  He has no cervical adenopathy.  Neurological: He is alert and oriented to person, place, and time.  Skin: surrounding erythema and swelling between shoulder blades Psychiatric: He has a normal mood and affect. His behavior is normal.     LABS: Results for orders placed or performed during the hospital encounter of 07/07/19 (from the past 48 hour(s))  Glucose, capillary     Status: Abnormal   Collection Time: 07/13/19  4:37 PM  Result Value Ref Range   Glucose-Capillary 133 (H) 70 - 99 mg/dL  Glucose, capillary     Status: Abnormal   Collection Time: 07/13/19  9:51 PM  Result Value Ref Range   Glucose-Capillary 172 (H) 70 - 99 mg/dL   Comment 1 Notify RN    Comment 2 Document in Chart   CBC with Differential/Platelet     Status: Abnormal   Collection Time: 07/14/19  4:08 AM  Result Value Ref Range   WBC 10.9 (H) 4.0 - 10.5 K/uL   RBC 3.57 (L) 4.22 - 5.81 MIL/uL   Hemoglobin 10.1 (L) 13.0 - 17.0 g/dL   HCT 32.0 (L) 39.0 - 52.0 %   MCV 89.6 80.0 - 100.0 fL   MCH 28.3 26.0 - 34.0 pg   MCHC 31.6 30.0 - 36.0 g/dL   RDW 13.0 11.5 - 15.5 %   Platelets 259 150 - 400 K/uL   nRBC 0.0 0.0 - 0.2 %    Neutrophils Relative % 65 %   Neutro Abs 7.1 1.7 - 7.7 K/uL   Lymphocytes Relative 18 %   Lymphs Abs 2.0 0.7 - 4.0 K/uL   Monocytes Relative 9 %   Monocytes Absolute 1.0 0.1 - 1.0 K/uL   Eosinophils Relative 6 %   Eosinophils Absolute 0.6 (H) 0.0 - 0.5 K/uL   Basophils Relative 1 %   Basophils Absolute 0.1 0.0 -  0.1 K/uL   Immature Granulocytes 1 %   Abs Immature Granulocytes 0.12 (H) 0.00 - 0.07 K/uL    Comment: Performed at [facility], [place] [address] [place], [place] [address]  Comprehensive metabolic panel     Status: Abnormal   Collection Time: 07/14/19  4:08 AM  Result Value Ref Range   Sodium 136 135 - 145 mmol/L   Potassium 3.9 3.5 - 5.1 mmol/L   Chloride 104 98 - 111 mmol/L   CO2 21 (L) 22 - 32 mmol/L   Glucose, Bld 156 (H) 70 - 99 mg/dL   BUN 11 6 - 20 mg/dL   Creatinine, Ser 1.23 0.61 - 1.24 mg/dL   Calcium 8.0 (L) 8.9 - 10.3 mg/dL   Total Protein 6.2 (L) 6.5 - 8.1 g/dL   Albumin 2.2 (L) 3.5 - 5.0 g/dL   AST 19 15 - 41 U/L   ALT 25 0 - 44 U/L   Alkaline Phosphatase 70 38 - 126 U/L   Total Bilirubin 0.3 0.3 - 1.2 mg/dL   GFR calc non Af Amer >60 >60 mL/min   GFR calc Af Amer >60 >60 mL/min   Anion gap 11 5 - 15    Comment: Performed at [facility], [place] [address], [place], [place] [address]  Glucose, capillary     Status: Abnormal   Collection Time: 07/14/19  6:17 AM  Result Value Ref Range   Glucose-Capillary 131 (H) 70 - 99 mg/dL   Comment 1 Notify RN    Comment 2 Document in Chart   Glucose, capillary     Status: Abnormal   Collection Time: 07/14/19  1:09 PM  Result Value Ref Range   Glucose-Capillary 122 (H) 70 - 99 mg/dL  Glucose, capillary     Status: Abnormal   Collection Time: 07/14/19  4:26 PM  Result Value Ref Range   Glucose-Capillary 152 (H) 70 - 99 mg/dL  Glucose, capillary     Status: Abnormal   Collection Time: 07/14/19  9:17 PM  Result Value Ref Range   Glucose-Capillary 154 (H) 70 - 99 mg/dL  CBC with Differential/Platelet      Status: Abnormal   Collection Time: 07/15/19  4:07 AM  Result Value Ref Range   WBC 8.8 4.0 - 10.5 K/uL   RBC 3.94 (L) 4.22 - 5.81 MIL/uL   Hemoglobin 11.1 (L) 13.0 - 17.0 g/dL   HCT 34.7 (L) 39.0 - 52.0 %   MCV 88.1 80.0 - 100.0 fL   MCH 28.2 26.0 - 34.0 pg   MCHC 32.0 30.0 - 36.0 g/dL   RDW 13.0 11.5 - 15.5 %   Platelets 283 150 - 400 K/uL   nRBC 0.0 0.0 - 0.2 %   Neutrophils Relative % 64 %   Neutro Abs 5.7 1.7 - 7.7 K/uL   Lymphocytes Relative 21 %   Lymphs Abs 1.9 0.7 - 4.0 K/uL   Monocytes Relative 8 %   Monocytes Absolute 0.7 0.1 - 1.0 K/uL   Eosinophils Relative 5 %   Eosinophils Absolute 0.4 0.0 - 0.5 K/uL   Basophils Relative 1 %   Basophils Absolute 0.1 0.0 - 0.1 K/uL   Immature Granulocytes 1 %   Abs Immature Granulocytes 0.07 0.00 - 0.07 K/uL    Comment: Performed at [facility], [address], [place], [place] [address]  Glucose, capillary     Status: Abnormal   Collection Time: 07/15/19  6:21 AM  Result Value  Ref Range   Glucose-Capillary 131 (H) 70 - 99 mg/dL  Glucose, capillary     Status: Abnormal   Collection Time: 07/15/19 10:56 AM  Result Value Ref Range   Glucose-Capillary 129 (H) 70 - 99 mg/dL    MICRO: Susceptibility   Staphylococcus lugdunensis    MIC    CIPROFLOXACIN <=0.5 SENSI... Sensitive    CLINDAMYCIN >=8 RESISTANT  Resistant    ERYTHROMYCIN >=8 RESISTANT  Resistant    GENTAMICIN <=0.5 SENSI... Sensitive    Inducible Clindamycin NEGATIVE  Sensitive    OXACILLIN >=4 RESISTANT  Resistant    RIFAMPIN >=32 RESIST... Resistant    TETRACYCLINE >=16 RESIST... Resistant    TRIMETH/SULFA <=10 SENSIT... Sensitive         Susceptibility Comments  Staphylococcus lugdunensis  RARE STAPHYLOCOCCUS LUGDUNENSIS    IMAGING: No results found.  Assessment/Plan: 48yo M with [id number] complicated by complex deep abscess s/p I x D - polymicrobial with S. Lugdunensis, and yeast  - recommend to treat with linezolid 600mg  po  BID x 14 d plus  fluconazole 400mg  po daily x 14 d - we will see the patient back in clinic in 2 wk to see if need to extend abtx - continue with wound car recs per surgery/wound care team  DM= recommend better management of DM, appears to be not at goal. hgb a1 c at 9.5  Health maintenance = recommend to check hep c ab

## 2019-07-15 NOTE — Progress Notes (Signed)
PROGRESS NOTE    KODE HEMMEN  [id number] DOB: [birth year] DOA: 07/07/2019 PCP: Rogers Blocker, MD      Brief Narrative:  Mr. Rosner is a 48 y.o. M with morbid obesity, OSA, schizophrenia, and HTN who presented to ED on 07/08/2019 with back pain and fever for 1 week duration.  This is started with a small dimple.  No trauma or insect bite reported.  Patient was febrile at 55 F in the emergency department with leukocytosis of 17.3.  CT chest showed possible thymoma and extensive involvement of upper back concerning for cellulitis but no drainable abscess.  Patient was admitted under hospitalist service and was continued on IV antibiotics vancomycin.  CT surgery was consulted for possible thymoma and they recommended outpatient follow-up with Dr. Kipp Brood.  He then developed fluctuating mass, suggesting abscess.  General surgery was consulted and he underwent incision and drainage on 07/10/2019.   Assessment & Plan:  Sepsis secondary to upper right back cellulitis and abscess: Patient remains afebrile for last few days.  Continues to feel better.  Some jump in white cells today.  Status post incision and drainage of the abscess on 07/10/2019.  Culture is growing fungus as well as gram-positive cocci.  Final culture results are still not available.  Final culture grew Staphylococcus lugdunensis however used final culture still is pending his antibiotics were switched to Bactrim DS yesterday per pharmacy recommendations.  ID consulted today and antibiotic switch again to linezolid 600 mg p.o. twice daily.  Continue fluconazole 400 mg p.o. daily.  Appreciate ID help.  AKI:  Admitted with Cr 1.1 which has climbed up to 1.5 to maximum.  Resolved now. .  Possible thymoma CT reports incidental finding of "numerous ground glass and soft tissue nodules at location of thymus", possible thymoma or residual thymus. -Consulted CT surgery, appreciate expertise --> needs outpatient follow up with Dr.  Kipp Brood for follow up labs, as well as ordering MRI chest and/or scrotal ultrasound  Hypertension BP stable this AM -Continue to hold irbesartan  Diabetes mellitus type II: Per patient, he was always told that he was prediabetic however his hemoglobin A1c has always been in diabetes range and latest hemoglobin A1c has been 9.5 on 07/08/2019.  Seems to take glipizide at home.  Blood sugar very well controlled.  Continue Lantus 35 units and SSI.   Schizophrenia This is a chart history from 2012.   Not on meds.  Weight gain during hospitalization and bilateral lower extremity edema : Per chart review, patient has gained approximately 40 pounds since admission, not sure how much accurate charting is.  I gave him only 1 dose of IV Lasix 40 mg and per chart, he had about 8 L of urine output yesterday, once again that is unbelievable.  His weight also seems to have dropped approximately 6 kg.  I will give him another dose of IV Lasix 80 mg today.   DVT prophylaxis: Lovenox Code Status: FULL Family Communication: None present.  Plan of care discussed with patient.  No questions. Disposition plan: Cleared by general surgery and ID to discharge however home health issues are still pending.  Will be discharged tomorrow.  Consultants:   CT surgery  General Surgery  Procedures:   10/11 CT chest -- no abscess  10/13 [place] back -- 2x4 abscess  10/14 operative I&D  Antimicrobials:   Vancomycin 10/11 >>  Clindamycin x1 day  Zosyn x1 day   Fluconazole 07/11/2019>   Subjective: Seen and examined.  No complaints.  Feels better.  Objective: Vitals:   07/14/19 1940 07/15/19 0500 07/15/19 0809 07/15/19 1443  BP: (!) 142/82 (!) 118/50 117/75 (!) 140/91  Pulse: 68  68 (!) 56  Resp:      Temp: 99.2 F (37.3 C) 98 F (36.7 C) 98.4 F (36.9 C)   TempSrc: Oral Oral Oral   SpO2: 96% 95% 97% 93%  Weight:  (!) 179.4 kg    Height:        Intake/Output Summary (Last 24 hours) at  07/15/2019 1614 Last data filed at 07/15/2019 1557 Gross per 24 hour  Intake 120 ml  Output 5190 ml  Net -5070 ml   Filed Weights   07/13/19 0627 07/14/19 0449 07/15/19 0500  Weight: (!) 184.8 kg (!) 185.6 kg (!) 179.4 kg    Examination:  General exam: Appears calm and comfortable, morbidly obese Respiratory system: Clear to auscultation. Respiratory effort normal. Cardiovascular system: S1 & S2 heard, RRR. No JVD, murmurs, rubs, gallops or clicks. +1 pitting edema bilateral lower extremity Gastrointestinal system: Abdomen is nondistended, soft and nontender. No organomegaly or masses felt. Normal bowel sounds heard. Central nervous system: Alert and oriented. No focal neurological deficits. Extremities: Symmetric 5 x 5 power. Skin: No rashes, lesions or ulcers.  Wound VAC attached to the back Psychiatry: Judgement and insight appear normal. Mood & affect appropriate.   Data Reviewed: I have personally reviewed following labs and imaging studies:  CBC: Recent Labs  Lab 07/11/19 0303 07/12/19 0140 07/13/19 0300 07/14/19 0408 07/15/19 0407  WBC 20.9* 15.5* 9.6 10.9* 8.8  NEUTROABS  --  11.9* 5.8 7.1 5.7  HGB 11.6* 11.0* 10.6* 10.1* 11.1*  HCT 35.8* 34.0* 32.7* 32.0* 34.7*  MCV 88.6 88.8 89.1 89.6 88.1  PLT 248 231 222 259 [id number]   Basic Metabolic Panel: Recent Labs  Lab 07/10/19 2310 07/11/19 0303 07/12/19 0140 07/13/19 0300 07/14/19 0408  NA 132* 136 133* 134* 136  K 4.2 4.4 4.4 4.1 3.9  CL 102 105 106 107 104  CO2 20* 21* 20* 20* 21*  GLUCOSE 378* 296* 296* 211* 156*  BUN 16 15 18 15 11   CREATININE 1.52* 1.41* 1.26* 1.24 1.23  CALCIUM 8.1* 8.4* 8.1* 7.9* 8.0*  MG  --   --  1.7  --   --    GFR: Estimated Creatinine Clearance: 125.9 mL/min (by C-G formula based on SCr of 1.23 mg/dL). Liver Function Tests: Recent Labs  Lab 07/10/19 2310 07/12/19 0140 07/13/19 0300 07/14/19 0408  AST 19 21 22 19   ALT 21 24 24 25   ALKPHOS 78 74 71 70  BILITOT 0.6 0.4 0.6  0.3  PROT 6.6 6.1* 5.7* 6.2*  ALBUMIN 2.1* 2.0* 2.0* 2.2*   No results for input(s): LIPASE, AMYLASE in the last 168 hours. No results for input(s): AMMONIA in the last 168 hours. Coagulation Profile: No results for input(s): INR, PROTIME in the last 168 hours. Cardiac Enzymes: No results for input(s): CKTOTAL, CKMB, CKMBINDEX, TROPONINI in the last 168 hours. BNP (last 3 results) No results for input(s): PROBNP in the last 8760 hours. HbA1C: No results for input(s): HGBA1C in the last 72 hours. CBG: Recent Labs  Lab 07/14/19 1309 07/14/19 1626 07/14/19 2117 07/15/19 0621 07/15/19 1056  GLUCAP 122* 152* 154* 131* 129*   Lipid Profile: No results for input(s): CHOL, HDL, LDLCALC, TRIG, CHOLHDL, LDLDIRECT in the last 72 hours. Thyroid Function Tests: No results for input(s): TSH, T4TOTAL, FREET4, T3FREE, THYROIDAB in the last 72  hours. Anemia Panel: No results for input(s): VITAMINB12, FOLATE, FERRITIN, TIBC, IRON, RETICCTPCT in the last 72 hours. Urine analysis:    Component Value Date/Time   COLORURINE YELLOW 03/22/2014 0401   APPEARANCEUR CLEAR 03/22/2014 0401   LABSPEC 1.025 03/22/2014 0401   PHURINE 5.5 03/22/2014 0401   GLUCOSEU NEGATIVE 03/22/2014 0401   HGBUR NEGATIVE 03/22/2014 0401   HGBUR small 10/17/2008 0000   BILIRUBINUR neg 08/11/2015 1652   KETONESUR NEGATIVE 03/22/2014 0401   PROTEINUR 3+ 08/11/2015 1652   PROTEINUR >300 (A) 03/22/2014 0401   UROBILINOGEN 0.2 08/11/2015 1652   UROBILINOGEN 0.2 03/22/2014 0401   NITRITE neg 08/11/2015 1652   NITRITE NEGATIVE 03/22/2014 0401   LEUKOCYTESUR Negative 08/11/2015 1652   Sepsis Labs: @LABRCNTIP (procalcitonin:4,lacticacidven:4)  ) Recent Results (from the past 240 hour(s))  SARS CORONAVIRUS 2 (TAT 6-24 HRS) Nasopharyngeal Nasopharyngeal Swab     Status: None   Collection Time: 07/07/19  6:05 PM   Specimen: Nasopharyngeal Swab  Result Value Ref Range Status   SARS Coronavirus 2 NEGATIVE NEGATIVE Final     Comment: (NOTE) SARS-CoV-2 target nucleic acids are NOT DETECTED. The SARS-CoV-2 RNA is generally detectable in upper and lower respiratory specimens during the acute phase of infection. Negative results do not preclude SARS-CoV-2 infection, do not rule out co-infections with other pathogens, and should not be used as the sole basis for treatment or other patient management decisions. Negative results must be combined with clinical observations, patient history, and epidemiological information. The expected result is Negative. Fact Sheet for Patients: SugarRoll.be Fact Sheet for Healthcare Providers: https://www.woods-mathews.com/ This test is not yet approved or cleared by the [place] FDA and  has been authorized for detection and/or diagnosis of SARS-CoV-2 by FDA under an Emergency Use Authorization (EUA). This EUA will remain  in effect (meaning this test can be used) for the duration of the COVID-19 declaration under Section 56 4(b)(1) of the Act, 21 U.S.C. section 360bbb-3(b)(1), unless the authorization is terminated or revoked sooner. Performed at [facility], [place] [address], [place], [place] [address]   Culture, blood (routine x 2)     Status: None   Collection Time: 07/08/19  8:46 AM   Specimen: BLOOD LEFT HAND  Result Value Ref Range Status   Specimen Description BLOOD LEFT HAND  Final   Special Requests   Final    BOTTLES DRAWN AEROBIC ONLY Blood Culture results may not be optimal due to an inadequate volume of blood received in culture bottles   Culture   Final    NO GROWTH 5 DAYS Performed at [facility], [place] [address] [place], [place] [address]    Report Status 07/13/2019 FINAL  Final  Culture, blood (routine x 2)     Status: None   Collection Time: 07/08/19  8:51 AM   Specimen: BLOOD RIGHT HAND  Result Value Ref Range Status   Specimen Description BLOOD RIGHT HAND  Final   Special Requests    Final    BOTTLES DRAWN AEROBIC AND ANAEROBIC Blood Culture adequate volume   Culture   Final    NO GROWTH 5 DAYS Performed at [facility], [place] [address], [place], [place] [address]    Report Status 07/13/2019 FINAL  Final  Surgical PCR screen     Status: None   Collection Time: 07/10/19  1:31 PM   Specimen: Nasal Mucosa; Nasal Swab  Result Value Ref Range Status   MRSA, PCR NEGATIVE NEGATIVE Final  Staphylococcus aureus NEGATIVE NEGATIVE Final    Comment: (NOTE) The Xpert SA Assay (FDA approved for NASAL specimens in patients 72 years of age and older), is one component of a comprehensive surveillance program. It is not intended to diagnose infection nor to guide or monitor treatment. Performed at [facility], [place] [address] [place], [place] [address]   Aerobic/Anaerobic Culture (surgical/deep wound)     Status: None (Preliminary result)   Collection Time: 07/10/19  3:01 PM   Specimen: PATH Other; Tissue  Result Value Ref Range Status   Specimen Description ABSCESS BACK  Final   Special Requests PT ON VANC  Final   Gram Stain   Final    FEW WBC PRESENT, PREDOMINANTLY PMN FEW GRAM POSITIVE COCCI IN PAIRS IN CHAINS RARE YEAST    Culture   Final    RARE STAPHYLOCOCCUS LUGDUNENSIS CULTURE REINCUBATED FOR BETTER GROWTH Performed at [facility], [place] [address], [place], [place] [address]    Report Status PENDING  Incomplete   Organism ID, Bacteria STAPHYLOCOCCUS LUGDUNENSIS  Final      Susceptibility   Staphylococcus lugdunensis - MIC*    CIPROFLOXACIN <=0.5 SENSITIVE Sensitive     ERYTHROMYCIN >=8 RESISTANT Resistant     GENTAMICIN <=0.5 SENSITIVE Sensitive     OXACILLIN >=4 RESISTANT Resistant     TETRACYCLINE >=16 RESISTANT Resistant     TRIMETH/SULFA <=10 SENSITIVE Sensitive     CLINDAMYCIN >=8 RESISTANT Resistant     RIFAMPIN >=32 RESISTANT Resistant     Inducible Clindamycin NEGATIVE Sensitive     * RARE STAPHYLOCOCCUS LUGDUNENSIS      Radiology Studies: No results found.  Scheduled Meds: . enoxaparin (LOVENOX) injection  90 mg Subcutaneous Q24H  . fluconazole  400 mg Oral Daily  . insulin aspart  0-20 Units Subcutaneous TID WC  . insulin aspart  0-5 Units Subcutaneous QHS  . insulin aspart  4 Units Subcutaneous TID WC  . insulin glargine  35 Units Subcutaneous Daily  . linezolid  600 mg Oral Q12H  . rosuvastatin  10 mg Oral Daily  . tamsulosin  0.4 mg Oral QHS   Continuous Infusions:    LOS: 8 days   Time spent: 29 minutes  Darliss Cheney, MD [facility] 07/15/2019, 4:14 PM   Please page through [place]:  [email or web address] Password [email or web address] If 7PM-7AM, please contact night-coverage

## 2019-07-15 NOTE — Progress Notes (Signed)
Spoke with Verdis Frederickson at [facility] this am regarding pt's [place] needs. At this time a [facility] agency has not been secured for [facility] on 10/21- Verdis Frederickson has put an expedited request for [facility] needs- Pt has been approved for home [facility] wound VAC to be delivered this afternoon.  Update 1150- bedside RN informed this CM that pt now will not need home wound VAC- and will go home with wet-dry drsg changes- have notified Olivia Mackie with [employer] that wound VAC will not be needed- delivery of VAC has been canceled. - [facility] still trying to find [facility] for wound care.

## 2019-07-16 DIAGNOSIS — L02212 Cutaneous abscess of back [any part, except buttock]: Secondary | ICD-10-CM

## 2019-07-16 LAB — CBC WITH DIFFERENTIAL/PLATELET
Abs Immature Granulocytes: 0.08 10*3/uL — ABNORMAL HIGH (ref 0.00–0.07)
Basophils Absolute: 0.1 10*3/uL (ref 0.0–0.1)
Basophils Relative: 1 %
Eosinophils Absolute: 0.5 10*3/uL (ref 0.0–0.5)
Eosinophils Relative: 5 %
HCT: 35.5 % — ABNORMAL LOW (ref 39.0–52.0)
Hemoglobin: 11.4 g/dL — ABNORMAL LOW (ref 13.0–17.0)
Immature Granulocytes: 1 %
Lymphocytes Relative: 22 %
Lymphs Abs: 2 10*3/uL (ref 0.7–4.0)
MCH: 28.2 pg (ref 26.0–34.0)
MCHC: 32.1 g/dL (ref 30.0–36.0)
MCV: 87.9 fL (ref 80.0–100.0)
Monocytes Absolute: 0.7 10*3/uL (ref 0.1–1.0)
Monocytes Relative: 8 %
Neutro Abs: 5.7 10*3/uL (ref 1.7–7.7)
Neutrophils Relative %: 63 %
Platelets: 304 10*3/uL (ref 150–400)
RBC: 4.04 MIL/uL — ABNORMAL LOW (ref 4.22–5.81)
RDW: 13.1 % (ref 11.5–15.5)
WBC: 9 10*3/uL (ref 4.0–10.5)
nRBC: 0 % (ref 0.0–0.2)

## 2019-07-16 LAB — GLUCOSE, CAPILLARY
Glucose-Capillary: 125 mg/dL — ABNORMAL HIGH (ref 70–99)
Glucose-Capillary: 193 mg/dL — ABNORMAL HIGH (ref 70–99)

## 2019-07-16 MED ORDER — FLUCONAZOLE 200 MG PO TABS: 400.0000 mg | ORAL_TABLET | Freq: Every day | ORAL | 0 refills | Status: AC

## 2019-07-16 MED ORDER — GLIPIZIDE 10 MG PO TABS: 10.0000 mg | ORAL_TABLET | Freq: Two times a day (BID) | ORAL | 0 refills | Status: AC

## 2019-07-16 MED ORDER — METFORMIN HCL 500 MG PO TABS: 500.0000 mg | ORAL_TABLET | Freq: Two times a day (BID) | ORAL | 11 refills | Status: AC

## 2019-07-16 MED ORDER — OXYCODONE HCL 5 MG PO TABS: 5.0000 mg | ORAL_TABLET | Freq: Four times a day (QID) | ORAL | 0 refills | Status: AC | PRN

## 2019-07-16 MED ORDER — LINEZOLID 600 MG PO TABS: 600.0000 mg | ORAL_TABLET | Freq: Two times a day (BID) | ORAL | 0 refills | Status: AC

## 2019-07-16 MED FILL — glipiZIDE 10 MG TABS: 10 | 30 days supply | Qty: 60 | Fill #0

## 2019-07-16 MED FILL — metFORMIN HCL 500 MG TABS: 500 | 30 days supply | Qty: 60 | Fill #0

## 2019-07-16 MED FILL — LINEZOLID 600 MG TABS: 600 | 13 days supply | Qty: 26 | Fill #0

## 2019-07-16 MED FILL — oxyCODONE HCL 5 MG TABS: 5 | 3 days supply | Qty: 10 | Fill #0

## 2019-07-16 MED FILL — FLUCONAZOLE 200 MG TABLET: 200 | 10 days supply | Qty: 20 | Fill #0

## 2019-07-16 NOTE — TOC Transition Note (Signed)
Transition of Care [facility]) - CM/SW Discharge Note Marvetta Gibbons RN, BSN Transitions of Care Unit 4E- RN Case Manager [phone]   Patient Details  Name: [name] MRN: [id number] Date of Birth: 10-16-1970  Transition of Care [facility]) CM/SW Contact:  Dawayne Patricia, RN Phone Number: 07/16/2019, 11:08 AM   Clinical Narrative:    Pt stable for transition home, Have secured [facility] agency for [facility] needs that contracts with [place] with [place] at [facility] who has accepted [facility] referral. Pt informed and MD has been notified that pt can discharge home today with [facility] services in place.   Final next level of care: [place] Barriers to Discharge: No Barriers Identified, Barriers Resolved   Patient Goals and CMS Choice Patient states their goals for this hospitalization and ongoing recovery are:: to get back to being healthy CMS Medicare.gov Compare Post Acute Care list provided to:: Patient Choice offered to / list presented to : Patient  Discharge Placement  Home with [facility]                     Discharge Plan and Services   Discharge Planning Services: CM Consult Post Acute Care Choice: Home Health, Durable Medical Equipment          DME Arranged: N/A DME Agency: NA Date DME Agency Contacted: 07/12/19 Time DME Agency Contacted: [id number] Representative spoke with at DME Agency: [place]: RN([facility]- referral) [place] Agency: [place] Date [facility] Agency Contacted: 07/16/19 Time [place]: 1108 Representative spoke with at [place]: [place] ([place]) Interventions     Readmission Risk Interventions Readmission Risk Prevention Plan 07/16/2019  Post Dischage Appt Complete  Medication Screening Complete  Transportation Screening Complete  Some recent data might be hidden

## 2019-07-16 NOTE — Discharge Summary (Signed)
Physician Discharge Summary  LEONHARD DOWIE [id number] DOB: 1971/09/11 DOA: 07/07/2019  PCP: Rogers Blocker, MD  Admit date: 07/07/2019 Discharge date: 07/16/2019  Admitted From: Home Disposition: Home  Recommendations for Outpatient Follow-up:  1. Follow up with PCP in 1-2 weeks 2. Follow with general surgery in 2 to 3 weeks as a scheduled 3. Follow with ID in 2 weeks 4. Please obtain BMP/CBC in one week 5. Please follow up on the following pending results:  Home Health: Yes Equipment/Devices: None  Discharge Condition: Stable CODE STATUS: Full code Diet recommendation: Cardiac/diabetic  Subjective: Patient seen and examined.  Continues to do well.  No complaints.  Wants to go home.  Brief/Interim Summary: Mr. Cazenave a 48 y.o.Mwith morbid obesity, OSA, schizophrenia, and HTN who presented to ED on 07/08/2019 with back pain and fever for 1 week duration.  This started with a small dimple.  No trauma or insect bite reported.  Patient was febrile at 34 F in the emergency department with leukocytosis of 17.3.  CT chest showed possible thymoma and extensive involvement of upper back concerning for cellulitis but no drainable abscess.  Patient was admitted under hospitalist service due to sepsis secondary to right upper back cellulitis initially and then abscess and was continued on IV antibiotics vancomycin.  CT surgery was consulted for possible thymoma and they recommended outpatient follow-up with Dr. Kipp Brood.  He then developed fluctuating mass, suggesting abscess.  General surgery was consulted and he underwent incision and drainage on 07/10/2019.  The size of his abscess was 12 x 12 x 8 cm.  Postoperatively, wound VAC was attached to him.  He was continued on IV antibiotics throughout the course.  Subsequently, wound VAC was removed and he was started on wet-to-dry dressing.  He was evaluated by PT OT and they recommended home health.  Blood cultures remain negative however  his abscess culture grew Staphylococcus lugdunensis and rare fungus (final fungal culture is still pending).  Due to this, patient was started on fluconazole.  Subsequently final sensitivities of her bacteria were known.  ID was consulted and they recommended linezolid 600 mg p.o. twice daily for 14 days (13 days remaining) as well as fluconazole 40 mg p.o. daily for 14 days (10 days remaining, since this was started 4 days ago here in the hospital).  He also came in with AKI which has resolved.  His diabetes was uncontrolled for which he was started on Lantus which was modified accordingly and he was on 35 units daily along with SSI.  At home, he is only on glipizide 5 mg.  His hemoglobin A1c was over 9.  He tells me that his blood sugar usually runs in 140s in the morning.  His diabetes shows he needs a better control.  He tells me that he was on Metformin at one point in time but that was discontinued and he does not know the reason for that.  At this point in time in an effort to better control his diabetes, I am taking the liberty to start him back on Metformin 500 mg twice daily and increasing his glipizide to 10 mg twice daily all day from 5 mg daily.  I have advised him to check his blood sugar at least twice, once before breakfast and 1 at night and make a log and bring that log to his PCP which will help his PCP to adjust medications.  During hospitalization, for some reason, he also gained some weight, likely due to fluid  overload.  He was given Lasix 2 consecutive days, yesterday and day before and he has had significant diuresis.  He has +1 pitting edema bilateral lower extremity.  I offered him 1 more dose of Lasix today however patient declined that.  He is medically stable, has been cleared by general surgery and ID so he is going to be discharged home.  He will follow up with PCP next week, ID 2 weeks and general surgery in 2 to 3 weeks.  Discharge Diagnoses:  Principal Problem:   Sepsis  ([place]) Active Problems:   Diabetes mellitus type II, uncontrolled ([place])   OBSTRUCTIVE SLEEP APNEA   HTN (hypertension)   Hyperglycemia   Cellulitis of back   Abscess of back    Discharge Instructions  Discharge Instructions    Ambulatory referral to Nutrition and Diabetic Education   Complete by: As directed    Discharge patient   Complete by: As directed    Discharge disposition: 06-Home-Health Care Svc   Discharge patient date: 07/16/2019     Allergies as of 07/16/2019      Reactions   Cucumber Extract Shortness Of Breath      Medication List    TAKE these medications   fluconazole 200 MG tablet Commonly known as: DIFLUCAN Take 2 tablets (400 mg total) by mouth daily for 10 days. Start taking on: July 17, 2019   glipiZIDE 10 MG tablet Commonly known as: GLUCOTROL Take 1 tablet (10 mg total) by mouth 2 (two) times daily before a meal. What changed:   medication strength  how much to take  when to take this   irbesartan 300 MG tablet Commonly known as: AVAPRO Take 300 mg by mouth daily.   linezolid 600 MG tablet Commonly known as: ZYVOX Take 1 tablet (600 mg total) by mouth every 12 (twelve) hours for 13 days.   metFORMIN 500 MG tablet Commonly known as: Glucophage Take 1 tablet (500 mg total) by mouth 2 (two) times daily with a meal.   oxyCODONE 5 MG immediate release tablet Commonly known as: Oxy IR/ROXICODONE Take 1 tablet (5 mg total) by mouth every 6 (six) hours as needed for up to 10 doses for severe pain.   rosuvastatin 10 MG tablet Commonly known as: CRESTOR Take 10 mg by mouth daily.   tadalafil 5 MG tablet Commonly known as: CIALIS Take 5 mg by mouth daily.   tamsulosin 0.4 MG Caps capsule Commonly known as: FLOMAX Take 0.4 mg by mouth at bedtime.      Follow-up Information    [place], Lucile Crater, MD Follow up.   Specialty: Thoracic Surgery Why: Call Dr. Abran Duke office for follow up appointment about the mass in your  chest Contact information: [place] [address] [phone]        Surgery, [place]. Go on 07/30/2019.   Specialty: General Surgery Why: Follow up appointment for wound check scheduled for 10:15 AM. Please arrive 30 min prior to appointment time. Bring photo ID and insurance information.  Contact information: [address] [place] [address] [phone]        [facility] Follow up.   Why: Intake ref. # [id number]- call made for [facility] referral needs- they will contract for [facility] agency and contact you when servcies have been secured.  Contact information: Insurance referral care service center- referral made for [place], Medi Follow up.   Why: HHRN arranged-  they will call you to set up home visits for wound care needs Contact information: [place] [address] [phone]        Rogers Blocker, MD Follow up in 1 week(s).   Specialty: Internal Medicine Contact information: [place] [address] [phone]          Allergies  Allergen Reactions  . Cucumber Extract Shortness Of Breath    Consultations: ID and general surgery   Procedures/Studies: Ct Chest W Contrast  Result Date: 07/07/2019 CLINICAL DATA:  Swelling redness of the left upper back. EXAM: CT CHEST WITH CONTRAST TECHNIQUE: Multidetector CT imaging of the chest was performed during intravenous contrast administration. CONTRAST:  56mL OMNIPAQUE IOHEXOL 300 MG/ML  SOLN COMPARISON:  None. FINDINGS: Cardiovascular: No significant vascular findings. Normal heart size. No pericardial effusion. Mediastinum/Nodes: No enlarged mediastinal, hilar, or axillary lymph nodes. Thyroid gland, trachea, and esophagus demonstrate no significant findings. Numerous ground-glass and soft tissue nodules within the anterior mediastinal fat, at the anatomic location of the thymus. Lungs/Pleura: Lungs are clear. No pleural effusion  or pneumothorax. Upper Abdomen: No acute abnormality. Musculoskeletal: No fracture is seen. In correlation to the area of clinical concern, there is skin thickening and extensive subcutaneous fat stranding, to the level of the outer muscular fascia in the right upper back. No drainable fluid collection is seen. The skin thickening extends into the inferior back. Gynecomastia. IMPRESSION: 1. Numerous ground-glass and soft tissue nodules within the anterior mediastinal fat, at the anatomic location of the thymus. This may represent residual thymus or thymoma. Please correlate clinically. Close follow-up or tissue diagnosis may be considered. 2. In correlation to the area of clinical concern, there is skin thickening and extensive subcutaneous fat stranding, to the level of the outer muscular fascia in the right upper back. No drainable fluid collection is seen. The skin thickening extends into the inferior back. Electronically Signed   By: Fidela Salisbury M.D.   On: 07/07/2019 19:25   [place] Chest Soft Tissue  Result Date: 07/09/2019 CLINICAL DATA:  Soft tissue abscess of the mid upper back. EXAM: ULTRASOUND OF HEAD/NECK SOFT TISSUES TECHNIQUE: Ultrasound examination of the head and neck soft tissues was performed in the area of clinical concern. COMPARISON:  None. FINDINGS: There is a well-defined heterogeneous 2.0 x 1.5 x 1.5 cm mass in the soft tissues in the area of concern. The abnormality has a 5 mm long tail which extends to the skin surface. No appreciable perfusion in the mass. IMPRESSION: Ultrasound exam is consistent with a subcutaneous abscess, as described. Electronically Signed   By: Lorriane Shire M.D.   On: 07/09/2019 13:53      Discharge Exam: Vitals:   07/15/19 2029 07/16/19 0632  BP: 133/80 125/83  Pulse: 87   Resp: 18 19  Temp: 98.8 F (37.1 C) 98.4 F (36.9 C)  SpO2: 96% 98%   Vitals:   07/15/19 1443 07/15/19 2029 07/16/19 0500 07/16/19 0632  BP: (!) 140/91 133/80  125/83   Pulse: (!) 56 87    Resp:  18  19  Temp:  98.8 F (37.1 C)  98.4 F (36.9 C)  TempSrc:  Oral  Oral  SpO2: 93% 96%  98%  Weight:   (!) 175.2 kg   Height:        General: Pt is alert, awake, not in acute distress Cardiovascular: RRR, S1/S2 +, no rubs, no gallops Respiratory: CTA bilaterally, no wheezing, no rhonchi Abdominal: Soft,  NT, ND, bowel sounds + Extremities: no edema, no cyanosis    The results of significant diagnostics from this hospitalization (including imaging, microbiology, ancillary and laboratory) are listed below for reference.     Microbiology: Recent Results (from the past 240 hour(s))  SARS CORONAVIRUS 2 (TAT 6-24 HRS) Nasopharyngeal Nasopharyngeal Swab     Status: None   Collection Time: 07/07/19  6:05 PM   Specimen: Nasopharyngeal Swab  Result Value Ref Range Status   SARS Coronavirus 2 NEGATIVE NEGATIVE Final    Comment: (NOTE) SARS-CoV-2 target nucleic acids are NOT DETECTED. The SARS-CoV-2 RNA is generally detectable in upper and lower respiratory specimens during the acute phase of infection. Negative results do not preclude SARS-CoV-2 infection, do not rule out co-infections with other pathogens, and should not be used as the sole basis for treatment or other patient management decisions. Negative results must be combined with clinical observations, patient history, and epidemiological information. The expected result is Negative. Fact Sheet for Patients: SugarRoll.be Fact Sheet for Healthcare Providers: https://www.woods-mathews.com/ This test is not yet approved or cleared by the [place] FDA and  has been authorized for detection and/or diagnosis of SARS-CoV-2 by FDA under an Emergency Use Authorization (EUA). This EUA will remain  in effect (meaning this test can be used) for the duration of the COVID-19 declaration under Section 56 4(b)(1) of the Act, 21 U.S.C. section 360bbb-3(b)(1), unless  the authorization is terminated or revoked sooner. Performed at [facility], [place] [address], [place], [place] [address]   Culture, blood (routine x 2)     Status: None   Collection Time: 07/08/19  8:46 AM   Specimen: BLOOD LEFT HAND  Result Value Ref Range Status   Specimen Description BLOOD LEFT HAND  Final   Special Requests   Final    BOTTLES DRAWN AEROBIC ONLY Blood Culture results may not be optimal due to an inadequate volume of blood received in culture bottles   Culture   Final    NO GROWTH 5 DAYS Performed at [facility], [place] [address] [place], [place] [address]    Report Status 07/13/2019 FINAL  Final  Culture, blood (routine x 2)     Status: None   Collection Time: 07/08/19  8:51 AM   Specimen: BLOOD RIGHT HAND  Result Value Ref Range Status   Specimen Description BLOOD RIGHT HAND  Final   Special Requests   Final    BOTTLES DRAWN AEROBIC AND ANAEROBIC Blood Culture adequate volume   Culture   Final    NO GROWTH 5 DAYS Performed at [facility], [place] [address] [place], [place] [address]    Report Status 07/13/2019 FINAL  Final  Surgical PCR screen     Status: None   Collection Time: 07/10/19  1:31 PM   Specimen: Nasal Mucosa; Nasal Swab  Result Value Ref Range Status   MRSA, PCR NEGATIVE NEGATIVE Final   Staphylococcus aureus NEGATIVE NEGATIVE Final    Comment: (NOTE) The Xpert SA Assay (FDA approved for NASAL specimens in patients 75 years of age and older), is one component of a comprehensive surveillance program. It is not intended to diagnose infection nor to guide or monitor treatment. Performed at [facility], [place] [address] [place], [place] [address]   Aerobic/Anaerobic Culture (surgical/deep wound)     Status: None (Preliminary result)   Collection Time: 07/10/19  3:01 PM   Specimen: PATH Other; Tissue  Result Value Ref Range  Status   Specimen Description ABSCESS BACK  Final   Special Requests PT ON VANC  Final   Gram  Stain   Final    FEW WBC PRESENT, PREDOMINANTLY PMN FEW GRAM POSITIVE COCCI IN PAIRS IN CHAINS RARE YEAST    Culture   Final    RARE STAPHYLOCOCCUS LUGDUNENSIS CULTURE REINCUBATED FOR BETTER GROWTH Performed at [facility], [place] [address], [place], [place] [address]    Report Status PENDING  Incomplete   Organism ID, Bacteria STAPHYLOCOCCUS LUGDUNENSIS  Final      Susceptibility   Staphylococcus lugdunensis - MIC*    CIPROFLOXACIN <=0.5 SENSITIVE Sensitive     ERYTHROMYCIN >=8 RESISTANT Resistant     GENTAMICIN <=0.5 SENSITIVE Sensitive     OXACILLIN >=4 RESISTANT Resistant     TETRACYCLINE >=16 RESISTANT Resistant     TRIMETH/SULFA <=10 SENSITIVE Sensitive     CLINDAMYCIN >=8 RESISTANT Resistant     RIFAMPIN >=32 RESISTANT Resistant     Inducible Clindamycin NEGATIVE Sensitive     * RARE STAPHYLOCOCCUS LUGDUNENSIS     Labs: BNP (last 3 results) No results for input(s): BNP in the last 8760 hours. Basic Metabolic Panel: Recent Labs  Lab 07/10/19 2310 07/11/19 0303 07/12/19 0140 07/13/19 0300 07/14/19 0408  NA 132* 136 133* 134* 136  K 4.2 4.4 4.4 4.1 3.9  CL 102 105 106 107 104  CO2 20* 21* 20* 20* 21*  GLUCOSE 378* 296* 296* 211* 156*  BUN 16 15 18 15 11   CREATININE 1.52* 1.41* 1.26* 1.24 1.23  CALCIUM 8.1* 8.4* 8.1* 7.9* 8.0*  MG  --   --  1.7  --   --    Liver Function Tests: Recent Labs  Lab 07/10/19 2310 07/12/19 0140 07/13/19 0300 07/14/19 0408  AST 19 21 22 19   ALT 21 24 24 25   ALKPHOS 78 74 71 70  BILITOT 0.6 0.4 0.6 0.3  PROT 6.6 6.1* 5.7* 6.2*  ALBUMIN 2.1* 2.0* 2.0* 2.2*   No results for input(s): LIPASE, AMYLASE in the last 168 hours. No results for input(s): AMMONIA in the last 168 hours. CBC: Recent Labs  Lab 07/12/19 0140 07/13/19 0300 07/14/19 0408 07/15/19 0407 07/16/19 0314  WBC 15.5* 9.6 10.9* 8.8 9.0  NEUTROABS 11.9* 5.8 7.1 5.7 5.7  HGB 11.0* 10.6* 10.1* 11.1* 11.4*  HCT 34.0* 32.7* 32.0* 34.7* 35.5*  MCV 88.8 89.1  89.6 88.1 87.9  PLT 231 222 259 283 304   Cardiac Enzymes: No results for input(s): CKTOTAL, CKMB, CKMBINDEX, TROPONINI in the last 168 hours. BNP: Invalid input(s): POCBNP CBG: Recent Labs  Lab 07/15/19 1056 07/15/19 1635 07/15/19 2051 07/16/19 0630 07/16/19 1101  GLUCAP 129* 129* 163* 125* 193*   D-Dimer No results for input(s): DDIMER in the last 72 hours. Hgb A1c No results for input(s): HGBA1C in the last 72 hours. Lipid Profile No results for input(s): CHOL, HDL, LDLCALC, TRIG, CHOLHDL, LDLDIRECT in the last 72 hours. Thyroid function studies No results for input(s): TSH, T4TOTAL, T3FREE, THYROIDAB in the last 72 hours.  Invalid input(s): FREET3 Anemia work up No results for input(s): VITAMINB12, FOLATE, FERRITIN, TIBC, IRON, RETICCTPCT in the last 72 hours. Urinalysis    Component Value Date/Time   COLORURINE YELLOW 03/22/2014 0401   APPEARANCEUR CLEAR 03/22/2014 0401   LABSPEC 1.025 03/22/2014 0401   PHURINE 5.5 03/22/2014 0401   GLUCOSEU NEGATIVE 03/22/2014 0401   HGBUR NEGATIVE 03/22/2014 0401   HGBUR small 10/17/2008 0000   BILIRUBINUR neg  08/11/2015 1652   KETONESUR NEGATIVE 03/22/2014 0401   PROTEINUR 3+ 08/11/2015 1652   PROTEINUR >300 (A) 03/22/2014 0401   UROBILINOGEN 0.2 08/11/2015 1652   UROBILINOGEN 0.2 03/22/2014 0401   NITRITE neg 08/11/2015 1652   NITRITE NEGATIVE 03/22/2014 0401   LEUKOCYTESUR Negative 08/11/2015 1652   Sepsis Labs Invalid input(s): PROCALCITONIN,  WBC,  LACTICIDVEN Microbiology Recent Results (from the past 240 hour(s))  SARS CORONAVIRUS 2 (TAT 6-24 HRS) Nasopharyngeal Nasopharyngeal Swab     Status: None   Collection Time: 07/07/19  6:05 PM   Specimen: Nasopharyngeal Swab  Result Value Ref Range Status   SARS Coronavirus 2 NEGATIVE NEGATIVE Final    Comment: (NOTE) SARS-CoV-2 target nucleic acids are NOT DETECTED. The SARS-CoV-2 RNA is generally detectable in upper and lower respiratory specimens during the acute  phase of infection. Negative results do not preclude SARS-CoV-2 infection, do not rule out co-infections with other pathogens, and should not be used as the sole basis for treatment or other patient management decisions. Negative results must be combined with clinical observations, patient history, and epidemiological information. The expected result is Negative. Fact Sheet for Patients: SugarRoll.be Fact Sheet for Healthcare Providers: https://www.woods-mathews.com/ This test is not yet approved or cleared by the [place] FDA and  has been authorized for detection and/or diagnosis of SARS-CoV-2 by FDA under an Emergency Use Authorization (EUA). This EUA will remain  in effect (meaning this test can be used) for the duration of the COVID-19 declaration under Section 56 4(b)(1) of the Act, 21 U.S.C. section 360bbb-3(b)(1), unless the authorization is terminated or revoked sooner. Performed at [facility], [place] [address], [place], [place] [address]   Culture, blood (routine x 2)     Status: None   Collection Time: 07/08/19  8:46 AM   Specimen: BLOOD LEFT HAND  Result Value Ref Range Status   Specimen Description BLOOD LEFT HAND  Final   Special Requests   Final    BOTTLES DRAWN AEROBIC ONLY Blood Culture results may not be optimal due to an inadequate volume of blood received in culture bottles   Culture   Final    NO GROWTH 5 DAYS Performed at [facility], [place] [address], [place], [place] [address]    Report Status 07/13/2019 FINAL  Final  Culture, blood (routine x 2)     Status: None   Collection Time: 07/08/19  8:51 AM   Specimen: BLOOD RIGHT HAND  Result Value Ref Range Status   Specimen Description BLOOD RIGHT HAND  Final   Special Requests   Final    BOTTLES DRAWN AEROBIC AND ANAEROBIC Blood Culture adequate volume   Culture   Final    NO GROWTH 5 DAYS Performed at [facility], [place] [address],  [place], [place] [address]    Report Status 07/13/2019 FINAL  Final  Surgical PCR screen     Status: None   Collection Time: 07/10/19  1:31 PM   Specimen: Nasal Mucosa; Nasal Swab  Result Value Ref Range Status   MRSA, PCR NEGATIVE NEGATIVE Final   Staphylococcus aureus NEGATIVE NEGATIVE Final    Comment: (NOTE) The Xpert SA Assay (FDA approved for NASAL specimens in patients 50 years of age and older), is one component of a comprehensive surveillance program. It is not intended to diagnose infection nor to guide or monitor treatment. Performed at [facility], [place] [address] [place], [place] [address]   Aerobic/Anaerobic Culture (surgical/deep wound)  Status: None (Preliminary result)   Collection Time: 07/10/19  3:01 PM   Specimen: PATH Other; Tissue  Result Value Ref Range Status   Specimen Description ABSCESS BACK  Final   Special Requests PT ON VANC  Final   Gram Stain   Final    FEW WBC PRESENT, PREDOMINANTLY PMN FEW GRAM POSITIVE COCCI IN PAIRS IN CHAINS RARE YEAST    Culture   Final    RARE STAPHYLOCOCCUS LUGDUNENSIS CULTURE REINCUBATED FOR BETTER GROWTH Performed at [facility], [place] [address], [place], [place] [address]    Report Status PENDING  Incomplete   Organism ID, Bacteria STAPHYLOCOCCUS LUGDUNENSIS  Final      Susceptibility   Staphylococcus lugdunensis - MIC*    CIPROFLOXACIN <=0.5 SENSITIVE Sensitive     ERYTHROMYCIN >=8 RESISTANT Resistant     GENTAMICIN <=0.5 SENSITIVE Sensitive     OXACILLIN >=4 RESISTANT Resistant     TETRACYCLINE >=16 RESISTANT Resistant     TRIMETH/SULFA <=10 SENSITIVE Sensitive     CLINDAMYCIN >=8 RESISTANT Resistant     RIFAMPIN >=32 RESISTANT Resistant     Inducible Clindamycin NEGATIVE Sensitive     * RARE STAPHYLOCOCCUS LUGDUNENSIS     Time coordinating discharge: Over 30 minutes  SIGNED:   Darliss Cheney, MD  [facility] 07/16/2019, 11:19 AM  If 7PM-7AM, please contact  night-coverage [email or web address] Password [email or web address]

## 2019-07-16 NOTE — Progress Notes (Signed)
Pt requesting to wait for daughter to come to do his dressing change this morning. Pt wanting to make sure daughter is able to do dressing change and have any questions answered before his discharge. Will continue to monitor.

## 2019-07-18 LAB — AEROBIC/ANAEROBIC CULTURE W GRAM STAIN (SURGICAL/DEEP WOUND)

## 2019-07-24 NOTE — Progress Notes (Signed)
@Patient  ID: [name], male    DOB: 1971/08/29, 48 y.o.   MRN: [id number]  Chief Complaint  Patient presents with  . Follow-up    F/U on CPAP. Still using [employer] as DME. Denies any issues.     Referring provider: Rogers Blocker, MD  HPI:  48 year old male former smoker followed in our office for moderate obstructive sleep apnea.  Patient had previous diagnosis of obstructive sleep apnea in 2014 and was started on CPAP therapy but he stopped due to intolerance of machine interface in 2014.  PMH: Type 2 diabetes, hyperlipidemia, morbid obesity, schizophrenia, bipolar disorder, hypertension, cellulitis of back Smoker/ Smoking History: Former smoker.  Quit July/2020.  25-pack-year smoking history. Maintenance: None Pt of: Dr. Ander Slade  07/25/2019  - Visit   48 year old male former smoker followed in our office for moderate obstructive sleep apnea.  Patient had previously been treated with CPAP for his obstructive sleep apnea in 2014 but this was stopped due to patient's inability to tolerate the machine.  He presented to our office in July/2020 because he is interested in resuming CPAP therapy.  Patient's sleep study in June showed moderate obstructive sleep apnea.  Patient was started on CPAP therapy.  Patient presenting today for CPAP compliance review.  CPAP compliance report shows poor compliance.  See compliance report listed below:  06/22/2019-07/21/2019 20-13 on the last 30 days use, 5 of those days above 4 hours, average usage 3 hours and 53 minutes, APAP setting 5-20, 95th percentile 13.2, AHI 1.8 >>> Of note patient was hospitalized from 07/07/2019 through 07/16/2019, factoring this into account this would improve his overall adherence of his CPAP  Patient reports that prior to being hospitalized in October he was doing quite well on his CPAP.  He felt this was helping with his daytime fatigue as well as quality of sleep.  Unfortunately he is dealing with pain in his back right  now from the abscess that was drained and treated surgically when he was hospitalized in October/2020.  He is still having pain in his back.  He has home health nurses that come out twice a week to help clean the wound.  His daughter also cleans the wound 2 times a day.  They are keeping an eye on drainage color as well as his temperatures.  He has follow-up appointments with primary care as well as a specialist regarding this coming up.  Questionaires / Pulmonary Flowsheets:   MMRC: mMRC Dyspnea Scale mMRC Score  07/25/2019 0    Epworth:  Results of the Epworth flowsheet 04/22/2019  Sitting and reading 2  Watching TV 2  Sitting, inactive in a public place (e.g. a theatre or a meeting) 1  As a passenger in a car for an hour without a break 1  Lying down to rest in the afternoon when circumstances permit 2  Sitting and talking to someone 0  Sitting quietly after a lunch without alcohol 1  In a car, while stopped for a few minutes in traffic 0  Total score 9    Tests:   07/07/2019-CT chest with contrast-numerous groundglass and soft tissue nodules within the anterior mediastinal fat at the anatomic location of the thymus, this may represent residual thymus or thymoma, please correlate clinically, close follow-up for tissue diagnosis may be considered,, lungs are clear no pleural effusion or pneumothorax  04/26/2019-echocardiogram-LV ejection fraction 55 to 60%, mild concentric left ventricular hypertrophy,  03/22/2019-split-night sleep study-moderate obstructive sleep apnea, AHI 21.7, SaO2 low  72%  FENO:  No results found for: NITRICOXIDE  PFT: No flowsheet data found.  WALK:  No flowsheet data found.  Imaging: Ct Chest W Contrast  Result Date: 07/07/2019 CLINICAL DATA:  Swelling redness of the left upper back. EXAM: CT CHEST WITH CONTRAST TECHNIQUE: Multidetector CT imaging of the chest was performed during intravenous contrast administration. CONTRAST:  20mL OMNIPAQUE IOHEXOL  300 MG/ML  SOLN COMPARISON:  None. FINDINGS: Cardiovascular: No significant vascular findings. Normal heart size. No pericardial effusion. Mediastinum/Nodes: No enlarged mediastinal, hilar, or axillary lymph nodes. Thyroid gland, trachea, and esophagus demonstrate no significant findings. Numerous ground-glass and soft tissue nodules within the anterior mediastinal fat, at the anatomic location of the thymus. Lungs/Pleura: Lungs are clear. No pleural effusion or pneumothorax. Upper Abdomen: No acute abnormality. Musculoskeletal: No fracture is seen. In correlation to the area of clinical concern, there is skin thickening and extensive subcutaneous fat stranding, to the level of the outer muscular fascia in the right upper back. No drainable fluid collection is seen. The skin thickening extends into the inferior back. Gynecomastia. IMPRESSION: 1. Numerous ground-glass and soft tissue nodules within the anterior mediastinal fat, at the anatomic location of the thymus. This may represent residual thymus or thymoma. Please correlate clinically. Close follow-up or tissue diagnosis may be considered. 2. In correlation to the area of clinical concern, there is skin thickening and extensive subcutaneous fat stranding, to the level of the outer muscular fascia in the right upper back. No drainable fluid collection is seen. The skin thickening extends into the inferior back. Electronically Signed   By: Fidela Salisbury M.D.   On: 07/07/2019 19:25   [place] Chest Soft Tissue  Result Date: 07/09/2019 CLINICAL DATA:  Soft tissue abscess of the mid upper back. EXAM: ULTRASOUND OF HEAD/NECK SOFT TISSUES TECHNIQUE: Ultrasound examination of the head and neck soft tissues was performed in the area of clinical concern. COMPARISON:  None. FINDINGS: There is a well-defined heterogeneous 2.0 x 1.5 x 1.5 cm mass in the soft tissues in the area of concern. The abnormality has a 5 mm long tail which extends to the skin surface. No  appreciable perfusion in the mass. IMPRESSION: Ultrasound exam is consistent with a subcutaneous abscess, as described. Electronically Signed   By: Lorriane Shire M.D.   On: 07/09/2019 13:53    Lab Results:  CBC    Component Value Date/Time   WBC 9.0 07/16/2019 0314   RBC 4.04 (L) 07/16/2019 0314   HGB 11.4 (L) 07/16/2019 0314   HCT 35.5 (L) 07/16/2019 0314   PLT 304 07/16/2019 0314   MCV 87.9 07/16/2019 0314   MCH 28.2 07/16/2019 0314   MCHC 32.1 07/16/2019 0314   RDW 13.1 07/16/2019 0314   LYMPHSABS 2.0 07/16/2019 0314   MONOABS 0.7 07/16/2019 0314   EOSABS 0.5 07/16/2019 0314   BASOSABS 0.1 07/16/2019 0314    BMET    Component Value Date/Time   NA 136 07/14/2019 0408   K 3.9 07/14/2019 0408   CL 104 07/14/2019 0408   CO2 21 (L) 07/14/2019 0408   GLUCOSE 156 (H) 07/14/2019 0408   GLUCOSE 123 (H) 08/11/2006 1127   BUN 11 07/14/2019 0408   CREATININE 1.23 07/14/2019 0408   CALCIUM 8.0 (L) 07/14/2019 0408   GFRNONAA >60 07/14/2019 0408   GFRAA >60 07/14/2019 0408    BNP No results found for: BNP  ProBNP No results found for: PROBNP  Specialty Problems      Pulmonary Problems  OBSTRUCTIVE SLEEP APNEA    03/22/2019-split-night sleep study-moderate obstructive sleep apnea, AHI 21.7, SaO2 low 72%          Allergies  Allergen Reactions  . Cucumber Extract Shortness Of Breath    Immunization History  Administered Date(s) Administered  . Pneumococcal Polysaccharide-23 01/26/2012  . Td 07/29/2010   Flu vaccine already received   Past Medical History:  Diagnosis Date  . Ankle fracture, right   . Bell's palsy   . Bipolar affective disorder ([place])   . Bipolar disorder ([place])   . Diabetes mellitus   . Gout   . Hyperlipidemia    Per pt - reported on 03/07/12  . Hypertension   . Obesity   . OSA (obstructive sleep apnea)   . Sleep apnea     Tobacco History: Social History   Tobacco Use  Smoking Status Former Smoker  . Packs/day: 1.00  . Years:  25.00  . Pack years: 25.00  . Types: Cigarettes  . Quit date: 04/18/2019  . Years since quitting: 0.2  Smokeless Tobacco Never Used  Tobacco Comment   stopped 04/18/19   Counseling given: Yes Comment: stopped 04/18/19  Continue to not smoke  Outpatient Encounter Medications as of 07/25/2019  Medication Sig  . fluconazole (DIFLUCAN) 200 MG tablet Take 2 tablets (400 mg total) by mouth daily for 10 days.  Marland Kitchen glipiZIDE (GLUCOTROL) 10 MG tablet Take 1 tablet (10 mg total) by mouth 2 (two) times daily before a meal.  . irbesartan (AVAPRO) 300 MG tablet Take 300 mg by mouth daily.  Marland Kitchen linezolid (ZYVOX) 600 MG tablet Take 1 tablet (600 mg total) by mouth every 12 (twelve) hours for 13 days.  . metFORMIN (GLUCOPHAGE) 500 MG tablet Take 1 tablet (500 mg total) by mouth 2 (two) times daily with a meal.  . oxyCODONE (OXY IR/ROXICODONE) 5 MG immediate release tablet Take 1 tablet (5 mg total) by mouth every 6 (six) hours as needed for up to 10 doses for severe pain.  . rosuvastatin (CRESTOR) 10 MG tablet Take 10 mg by mouth daily.  . tadalafil (CIALIS) 5 MG tablet Take 5 mg by mouth daily.  . tamsulosin (FLOMAX) 0.4 MG CAPS capsule Take 0.4 mg by mouth at bedtime.  . [DISCONTINUED] acetaminophen (TYLENOL) suppository 650 mg   . [DISCONTINUED] acetaminophen (TYLENOL) tablet 650 mg   . [DISCONTINUED] enoxaparin (LOVENOX) injection 90 mg   . [DISCONTINUED] fluconazole (DIFLUCAN) tablet 400 mg   . [DISCONTINUED] insulin aspart (novoLOG) injection 0-20 Units   . [DISCONTINUED] insulin aspart (novoLOG) injection 0-5 Units   . [DISCONTINUED] insulin aspart (novoLOG) injection 4 Units   . [DISCONTINUED] insulin glargine (LANTUS) injection 35 Units   . [DISCONTINUED] linezolid (ZYVOX) tablet 600 mg   . [DISCONTINUED] morphine 4 MG/ML injection 4 mg   . [DISCONTINUED] ondansetron (ZOFRAN) injection 4 mg   . [DISCONTINUED] ondansetron (ZOFRAN) tablet 4 mg   . [DISCONTINUED] oxyCODONE (Oxy IR/ROXICODONE)  immediate release tablet 5 mg   . [DISCONTINUED] rosuvastatin (CRESTOR) tablet 10 mg   . [DISCONTINUED] tamsulosin (FLOMAX) capsule 0.4 mg    No facility-administered encounter medications on file as of 07/25/2019.      Review of Systems  Review of Systems  Constitutional: Negative for activity change, chills, fatigue, fever and unexpected weight change.  HENT: Negative for postnasal drip, rhinorrhea, sinus pressure, sinus pain and sore throat.   Eyes: Negative.   Respiratory: Negative for cough, shortness of breath and wheezing.   Cardiovascular: Negative for chest  pain and palpitations.  Gastrointestinal: Negative for constipation, diarrhea, nausea and vomiting.  Endocrine: Negative.   Genitourinary: Negative.   Musculoskeletal: Positive for back pain.  Skin: Negative.   Neurological: Negative for dizziness and headaches.  Psychiatric/Behavioral: Positive for sleep disturbance. Negative for dysphoric mood. The patient is not nervous/anxious.   All other systems reviewed and are negative.    Physical Exam  BP 128/74 (BP Location: Right Arm, Patient Position: Sitting, Cuff Size: Large)   Pulse (!) 59   Temp (!) 97.2 F (36.2 C) (Temporal)   Ht 6\' 3"  (1.905 m)   Wt (!) 382 lb (173.3 kg)   SpO2 98%   BMI 47.75 kg/m   Wt Readings from Last 5 Encounters:  07/25/19 (!) 382 lb (173.3 kg)  07/16/19 (!) 386 lb 4.8 oz (175.2 kg)  04/22/19 (!) 379 lb (171.9 kg)  03/22/19 (!) 370 lb (167.8 kg)  06/03/17 (!) 360 lb (163.3 kg)    BMI Readings from Last 5 Encounters:  07/25/19 47.75 kg/m  07/16/19 48.28 kg/m  04/22/19 47.37 kg/m  03/22/19 46.25 kg/m  06/03/17 45.00 kg/m     Physical Exam Vitals signs and nursing note reviewed.  Constitutional:      General: He is not in acute distress.    Appearance: Normal appearance. He is obese.  HENT:     Head: Normocephalic and atraumatic.     Right Ear: Hearing and external ear normal.     Left Ear: Hearing and external ear  normal.     Nose: Nose normal. No mucosal edema.     Right Turbinates: Not enlarged.     Left Turbinates: Not enlarged.     Mouth/Throat:     Mouth: Mucous membranes are dry.  Eyes:     Pupils: Pupils are equal, round, and reactive to light.  Neck:     Musculoskeletal: Normal range of motion.  Cardiovascular:     Rate and Rhythm: Normal rate and regular rhythm.     Pulses: Normal pulses.     Heart sounds: Normal heart sounds. No murmur.  Pulmonary:     Effort: Pulmonary effort is normal.     Breath sounds: Normal breath sounds. No decreased breath sounds, wheezing or rales.  Lymphadenopathy:     Cervical: No cervical adenopathy.  Skin:    General: Skin is warm and dry.     Capillary Refill: Capillary refill takes less than 2 seconds.     Findings: No erythema or rash.  Neurological:     General: No focal deficit present.     Mental Status: He is alert and oriented to person, place, and time.     Motor: No weakness.     Coordination: Coordination normal.     Gait: Gait is intact. Gait normal.  Psychiatric:        Mood and Affect: Mood normal.        Behavior: Behavior normal. Behavior is cooperative.        Thought Content: Thought content normal.        Judgment: Judgment normal.       Assessment & Plan:   OBSTRUCTIVE SLEEP APNEA Plan:  Continue CPAP use Order to DME company to adjust humidity Continue to work on weight loss strategies Follow-up in 1 year  Recurrent boils Plan: Continue to monitor your temperature Continue to follow appropriate wound care for your back wound Continue to follow-up with primary care Continue to follow-up with specialists for your back plan  MORBID  OBESITY Plan: Continue to work on weight loss strategies Follow low carbohydrate diet Increase physical activity to reduce your BMI  Abnormal findings on diagnostic imaging of lung Plan: Follow-up with primary care regarding October/2020 CT chest imaging results    Return in  about 1 year (around 07/24/2020), or if symptoms worsen or fail to improve, for Follow up with Dr. Ander Slade, Follow up with Wyn Quaker FNP-C.   Lauraine Rinne, NP 07/25/2019   This appointment was 26 minutes long with over 50% of the time in direct face-to-face patient care, assessment, plan of care, and follow-up.

## 2019-07-25 ENCOUNTER — Other Ambulatory Visit: Payer: Self-pay

## 2019-07-25 ENCOUNTER — Ambulatory Visit (INDEPENDENT_AMBULATORY_CARE_PROVIDER_SITE_OTHER): Payer: Medicare Other | Admitting: Pulmonary Disease

## 2019-07-25 ENCOUNTER — Encounter: Payer: Self-pay | Admitting: Pulmonary Disease

## 2019-07-25 VITALS — BP 128/74 | HR 59 | Temp 97.2°F | Ht 75.0 in | Wt 382.0 lb

## 2019-07-25 DIAGNOSIS — G4733 Obstructive sleep apnea (adult) (pediatric): Secondary | ICD-10-CM | POA: Diagnosis not present

## 2019-07-25 DIAGNOSIS — L0293 Carbuncle, unspecified: Secondary | ICD-10-CM

## 2019-07-25 DIAGNOSIS — R918 Other nonspecific abnormal finding of lung field: Secondary | ICD-10-CM

## 2019-07-25 NOTE — Patient Instructions (Addendum)
You were seen today by Lauraine Rinne, NP  for:   1. OBSTRUCTIVE SLEEP APNEA  We will place an order to your DME company for them to adjust the humidity settings of your CPAP You can also try to use Biotene mouthwash to help with the dryness in your mouth  We recommend that you continue using your CPAP daily >>>Keep up the hard work using your device >>> Goal should be wearing this for the entire night that you are sleeping, at least 4 to 6 hours  Remember:   Do not drive or operate heavy machinery if tired or drowsy.   Please notify the supply company and office if you are unable to use your device regularly due to missing supplies or machine being broken.   Work on maintaining a healthy weight and following your recommended nutrition plan   Maintain proper daily exercise and movement   Maintaining proper use of your device can also help improve management of other chronic illnesses such as: Blood pressure, blood sugars, and weight management.   BiPAP/ CPAP Cleaning:  >>>Clean weekly, with Dawn soap, and bottle brush.  Set up to air dry.    2. MORBID OBESITY  Continue to work on increasing physical activity and reducing your BMI  3. Back Wound  Please keep regular follow-up with primary care as well as your skin specialist Please keep regular follow-up with home health If you start having fevers or discolored drainage coming from your wound please notify primary care   Follow Up:    Return in about 1 year (around 07/24/2020), or if symptoms worsen or fail to improve, for Follow up with Dr. Ander Slade, Follow up with Wyn Quaker FNP-C.   Please do your part to reduce the spread of COVID-19:      Reduce your risk of any infection  and COVID19 by using the similar precautions used for avoiding the common cold or flu:   Wash your hands often with soap and warm water for at least 20 seconds.  If soap and water are not readily available, use an alcohol-based hand sanitizer  with at least 60% alcohol.   If coughing or sneezing, cover your mouth and nose by coughing or sneezing into the elbow areas of your shirt or coat, into a tissue or into your sleeve (not your hands).  WEAR A MASK when in public   Avoid shaking hands with others and consider head nods or verbal greetings only.  Avoid touching your eyes, nose, or mouth with unwashed hands.   Avoid close contact with people who are sick.  Avoid places or events with large numbers of people in one location, like concerts or sporting events.  If you have some symptoms but not all symptoms, continue to monitor at home and seek medical attention if your symptoms worsen.  If you are having a medical emergency, call 911.   [place] / e-Visit: [email or web address]         [facility]: [facility]: [id number]                   [facility]: [id number]     It is flu season:   >>> Best ways to protect herself from the flu: Receive the yearly flu vaccine, practice good hand hygiene washing with soap and also using hand sanitizer when available, eat a nutritious meals, get adequate rest, hydrate appropriately  Please contact the office if your symptoms worsen or you have concerns that you are not improving.   Thank you for choosing [facility] for your healthcare, and for allowing [place] to partner with you on your healthcare journey. I am thankful to be able to provide care to you today.   Wyn Quaker FNP-C    Sleep Apnea Sleep apnea affects breathing during sleep. It causes breathing to stop for a short time or to become shallow. It can also increase the risk of:  Heart attack.  Stroke.  Being very overweight (obese).  Diabetes.  Heart failure.  Irregular heartbeat. The goal of treatment is to help you breathe normally  again. What are the causes? There are three kinds of sleep apnea:  Obstructive sleep apnea. This is caused by a blocked or collapsed airway.  Central sleep apnea. This happens when the brain does not send the right signals to the muscles that control breathing.  Mixed sleep apnea. This is a combination of obstructive and central sleep apnea. The most common cause of this condition is a collapsed or blocked airway. This can happen if:  Your throat muscles are too relaxed.  Your tongue and tonsils are too large.  You are overweight.  Your airway is too small. What increases the risk?  Being overweight.  Smoking.  Having a small airway.  Being older.  Being male.  Drinking alcohol.  Taking medicines to calm yourself (sedatives or tranquilizers).  Having family members with the condition. What are the signs or symptoms?  Trouble staying asleep.  Being sleepy or tired during the day.  Getting angry a lot.  Loud snoring.  Headaches in the morning.  Not being able to focus your mind (concentrate).  Forgetting things.  Less interest in sex.  Mood swings.  Personality changes.  Feelings of sadness (depression).  Waking up a lot during the night to pee (urinate).  Dry mouth.  Sore throat. How is this diagnosed?  Your medical history.  A physical exam.  A test that is done when you are sleeping (sleep study). The test is most often done in a sleep lab but may also be done at home. How is this treated?   Sleeping on your side.  Using a medicine to get rid of mucus in your nose (decongestant).  Avoiding the use of alcohol, medicines to help you relax, or certain pain medicines (narcotics).  Losing weight, if needed.  Changing your diet.  Not smoking.  Using a machine to open your airway while you sleep, such as: ? An oral appliance. This is a mouthpiece that shifts your lower jaw forward. ? A CPAP device. This device blows air through a mask  when you breathe out (exhale). ? An EPAP device. This has valves that you put in each nostril. ? A BPAP device. This device blows air through a mask when you breathe in (inhale) and breathe out.  Having surgery if other treatments do not work. It is important to get treatment for sleep apnea. Without treatment, it can lead to:  High blood pressure.  Coronary artery disease.  In men, not being able to have an erection (impotence).  Reduced thinking ability. Follow these instructions at home: Lifestyle  Make changes that your doctor recommends.  Eat a healthy diet.  Lose weight if needed.  Avoid alcohol, medicines to help you relax, and some pain medicines.  Do not use any products that contain nicotine or tobacco, such as cigarettes, e-cigarettes,  and chewing tobacco. If you need help quitting, ask your doctor. General instructions  Take over-the-counter and prescription medicines only as told by your doctor.  If you were given a machine to use while you sleep, use it only as told by your doctor.  If you are having surgery, make sure to tell your doctor you have sleep apnea. You may need to bring your device with you.  Keep all follow-up visits as told by your doctor. This is important. Contact a doctor if:  The machine that you were given to use during sleep bothers you or does not seem to be working.  You do not get better.  You get worse. Get help right away if:  Your chest hurts.  You have trouble breathing in enough air.  You have an uncomfortable feeling in your back, arms, or stomach.  You have trouble talking.  One side of your body feels weak.  A part of your face is hanging down. These symptoms may be an emergency. Do not wait to see if the symptoms will go away. Get medical help right away. Call your local emergency services (911 in the [place] Do not drive yourself to the hospital. Summary  This condition affects breathing during sleep.  The most  common cause is a collapsed or blocked airway.  The goal of treatment is to help you breathe normally while you sleep. This information is not intended to replace advice given to you by your health care provider. Make sure you discuss any questions you have with your health care provider. Document Released: 06/21/2008 Document Revised: 06/29/2018 Document Reviewed: 05/08/2018 Elsevier Patient Education  [place].   CPAP and BPAP Information CPAP and BPAP are methods of helping a person breathe with the use of air pressure. CPAP stands for "continuous positive airway pressure." BPAP stands for "bi-level positive airway pressure." In both methods, air is blown through your nose or mouth and into your air passages to help you breathe well. CPAP and BPAP use different amounts of pressure to blow air. With CPAP, the amount of pressure stays the same while you breathe in and out. With BPAP, the amount of pressure is increased when you breathe in (inhale) so that you can take larger breaths. Your health care provider will recommend whether CPAP or BPAP would be more helpful for you. Why are CPAP and BPAP treatments used? CPAP or BPAP can be helpful if you have:  Sleep apnea.  Chronic obstructive pulmonary disease (COPD).  Heart failure.  Medical conditions that weaken the muscles of the chest including muscular dystrophy, or neurological diseases such as amyotrophic lateral sclerosis (ALS).  Other problems that cause breathing to be weak, abnormal, or difficult. CPAP is most commonly used for obstructive sleep apnea (OSA) to keep the airways from collapsing when the muscles relax during sleep. How is CPAP or BPAP administered? Both CPAP and BPAP are provided by a small machine with a flexible plastic tube that attaches to a plastic mask. You wear the mask. Air is blown through the mask into your nose or mouth. The amount of pressure that is used to blow the air can be adjusted on the  machine. Your health care provider will determine the pressure setting that should be used based on your individual needs. When should CPAP or BPAP be used? In most cases, the mask only needs to be worn during sleep. Generally, the mask needs to be worn throughout the night and during any daytime naps.  People with certain medical conditions may also need to wear the mask at other times when they are awake. Follow instructions from your health care provider about when to use the machine. What are some tips for using the mask?   Because the mask needs to be snug, some people feel trapped or closed-in (claustrophobic) when first using the mask. If you feel this way, you may need to get used to the mask. One way to do this is by holding the mask loosely over your nose or mouth and then gradually applying the mask more snugly. You can also gradually increase the amount of time that you use the mask.  Masks are available in various types and sizes. Some fit over your mouth and nose while others fit over just your nose. If your mask does not fit well, talk with your health care provider about getting a different one.  If you are using a mask that fits over your nose and you tend to breathe through your mouth, a chin strap may be applied to help keep your mouth closed.  The CPAP and BPAP machines have alarms that may sound if the mask comes off or develops a leak.  If you have trouble with the mask, it is very important that you talk with your health care provider about finding a way to make the mask easier to tolerate. Do not stop using the mask. Stopping the use of the mask could have a negative impact on your health. What are some tips for using the machine?  Place your CPAP or BPAP machine on a secure table or stand near an electrical outlet.  Know where the on/off switch is located on the machine.  Follow instructions from your health care provider about how to set the pressure on your machine and  when you should use it.  Do not eat or drink while the CPAP or BPAP machine is on. Food or fluids could get pushed into your lungs by the pressure of the CPAP or BPAP.  Do not smoke. Tobacco smoke residue can damage the machine.  For home use, CPAP and BPAP machines can be rented or purchased through home health care companies. Many different brands of machines are available. Renting a machine before purchasing may help you find out which particular machine works well for you.  Keep the CPAP or BPAP machine and attachments clean. Ask your health care provider for specific instructions. Get help right away if:  You have redness or open areas around your nose or mouth where the mask fits.  You have trouble using the CPAP or BPAP machine.  You cannot tolerate wearing the CPAP or BPAP mask.  You have pain, discomfort, and bloating in your abdomen. Summary  CPAP and BPAP are methods of helping a person breathe with the use of air pressure.  Both CPAP and BPAP are provided by a small machine with a flexible plastic tube that attaches to a plastic mask.  If you have trouble with the mask, it is very important that you talk with your health care provider about finding a way to make the mask easier to tolerate. This information is not intended to replace advice given to you by your health care provider. Make sure you discuss any questions you have with your health care provider. Document Released: 06/10/2004 Document Revised: 01/02/2019 Document Reviewed: 08/01/2016 Elsevier Patient Education  2020 [employer].

## 2019-07-25 NOTE — Assessment & Plan Note (Signed)
Plan:  Continue CPAP use Order to DME company to adjust humidity Continue to work on weight loss strategies Follow-up in 1 year

## 2019-07-25 NOTE — Addendum Note (Signed)
Addended by: Valerie Salts on: 07/25/2019 10:02 AM   Modules accepted: Orders

## 2019-07-25 NOTE — Assessment & Plan Note (Signed)
Plan: Follow-up with primary care regarding October/2020 CT chest imaging results

## 2019-07-25 NOTE — Assessment & Plan Note (Signed)
Plan: Continue to work on weight loss strategies Follow low carbohydrate diet Increase physical activity to reduce your BMI

## 2019-07-25 NOTE — Assessment & Plan Note (Signed)
Plan: Continue to monitor your temperature Continue to follow appropriate wound care for your back wound Continue to follow-up with primary care Continue to follow-up with specialists for your back plan

## 2019-07-31 ENCOUNTER — Ambulatory Visit (INDEPENDENT_AMBULATORY_CARE_PROVIDER_SITE_OTHER): Payer: Medicare Other | Admitting: Internal Medicine

## 2019-07-31 ENCOUNTER — Encounter: Payer: Self-pay | Admitting: Internal Medicine

## 2019-07-31 ENCOUNTER — Other Ambulatory Visit: Payer: Self-pay

## 2019-07-31 VITALS — BP 107/73 | HR 73 | Temp 98.6°F | Wt 386.4 lb

## 2019-07-31 DIAGNOSIS — L02212 Cutaneous abscess of back [any part, except buttock]: Secondary | ICD-10-CM | POA: Diagnosis not present

## 2019-07-31 NOTE — Progress Notes (Signed)
RFV: follow up for deep tissue infection to back  Patient ID: Justin LATTANZI, male   DOB: 1971/03/01, 48 y.o.   MRN: [id number]  HPI 48yo M with large abscess between shoulder blades requiring I x D. Doing well still doing twice a day wet to dry  Outpatient Encounter Medications as of 07/31/2019  Medication Sig  . glipiZIDE (GLUCOTROL) 10 MG tablet Take 1 tablet (10 mg total) by mouth 2 (two) times daily before a meal.  . irbesartan (AVAPRO) 300 MG tablet Take 300 mg by mouth daily.  . metFORMIN (GLUCOPHAGE) 500 MG tablet Take 1 tablet (500 mg total) by mouth 2 (two) times daily with a meal.  . oxyCODONE (OXY IR/ROXICODONE) 5 MG immediate release tablet Take 1 tablet (5 mg total) by mouth every 6 (six) hours as needed for up to 10 doses for severe pain.  . rosuvastatin (CRESTOR) 10 MG tablet Take 10 mg by mouth daily.  . tadalafil (CIALIS) 5 MG tablet Take 5 mg by mouth daily.  . tamsulosin (FLOMAX) 0.4 MG CAPS capsule Take 0.4 mg by mouth at bedtime.   No facility-administered encounter medications on file as of 07/31/2019.      Patient Active Problem List   Diagnosis Date Noted  . Abnormal findings on diagnostic imaging of lung 07/25/2019  . Abscess of back 07/16/2019  . Hyperglycemia 07/08/2019  . Cellulitis of back   . Sepsis ([place]) 07/07/2019  . Preventative health care 08/11/2015  . HTN (hypertension) 12/20/2012  . Recurrent boils 12/20/2012  . OBSTRUCTIVE SLEEP APNEA 08/13/2010  . ANA POSITIVE 04/01/2010  . ERECTILE DYSFUNCTION, ORGANIC 03/30/2010  . Idiopathic chronic gout of right ankle without tophus 06/16/2009  . HYPERLIPIDEMIA 11/14/2008  . MORBID OBESITY 10/17/2008  . Diabetes mellitus type II, uncontrolled ([place]) 03/01/2007  . PROTEINURIA 03/01/2007  . SCHIZOPHRENIA 02/26/2007  . Bipolar disorder, unspecified ([place]) 02/26/2007     Health Maintenance Due  Topic Date Due  . FOOT EXAM  05/16/2015  . OPHTHALMOLOGY EXAM  02/08/2016  . COLONOSCOPY  07/21/2019     Review of Systems 12 point ros is negative Physical Exam   BP 107/73   Pulse 73   Temp 98.6 F (37 C)   Wt (!) 386 lb 6.4 oz (175.3 kg)   BMI 48.30 kg/m    gen = a x o by 4 in nad Back = deep wide excision but wound bed has good granulation tissue CBC Lab Results  Component Value Date   WBC 9.0 07/16/2019   RBC 4.04 (L) 07/16/2019   HGB 11.4 (L) 07/16/2019   HCT 35.5 (L) 07/16/2019   PLT 304 07/16/2019   MCV 87.9 07/16/2019   MCH 28.2 07/16/2019   MCHC 32.1 07/16/2019   RDW 13.1 07/16/2019   LYMPHSABS 2.0 07/16/2019   MONOABS 0.7 07/16/2019   EOSABS 0.5 07/16/2019    BMET Lab Results  Component Value Date   NA 136 07/14/2019   K 3.9 07/14/2019   CL 104 07/14/2019   CO2 21 (L) 07/14/2019   GLUCOSE 156 (H) 07/14/2019   BUN 11 07/14/2019   CREATININE 1.23 07/14/2019   CALCIUM 8.0 (L) 07/14/2019   GFRNONAA >60 07/14/2019   GFRAA >60 07/14/2019   Lab Results  Component Value Date   ESRSEDRATE 31 (H) 07/31/2019   Lab Results  Component Value Date   CRP 5.5 07/31/2019      Assessment and Plan  Deep tissue infection =  Continue with finishing out course of abtx  Will check labs

## 2019-08-01 LAB — CBC WITH DIFFERENTIAL/PLATELET
Absolute Monocytes: 770 cells/uL (ref 200–950)
Basophils Absolute: 49 cells/uL (ref 0–200)
Basophils Relative: 0.6 %
Eosinophils Absolute: 462 cells/uL (ref 15–500)
Eosinophils Relative: 5.7 %
HCT: 37.3 % — ABNORMAL LOW (ref 38.5–50.0)
Hemoglobin: 11.9 g/dL — ABNORMAL LOW (ref 13.2–17.1)
Lymphs Abs: 1887 cells/uL (ref 850–3900)
MCH: 28 pg (ref 27.0–33.0)
MCHC: 31.9 g/dL — ABNORMAL LOW (ref 32.0–36.0)
MCV: 87.8 fL (ref 80.0–100.0)
MPV: 9.8 fL (ref 7.5–12.5)
Monocytes Relative: 9.5 %
Neutro Abs: 4933 cells/uL (ref 1500–7800)
Neutrophils Relative %: 60.9 %
Platelets: 168 10*3/uL (ref 140–400)
RBC: 4.25 10*6/uL (ref 4.20–5.80)
RDW: 13.1 % (ref 11.0–15.0)
Total Lymphocyte: 23.3 %
WBC: 8.1 10*3/uL (ref 3.8–10.8)

## 2019-08-01 LAB — BASIC METABOLIC PANEL
BUN/Creatinine Ratio: 12 (calc) (ref 6–22)
BUN: 18 mg/dL (ref 7–25)
CO2: 25 mmol/L (ref 20–32)
Calcium: 8.9 mg/dL (ref 8.6–10.3)
Chloride: 106 mmol/L (ref 98–110)
Creat: 1.54 mg/dL — ABNORMAL HIGH (ref 0.70–1.33)
Glucose, Bld: 153 mg/dL — ABNORMAL HIGH (ref 65–99)
Potassium: 4.4 mmol/L (ref 3.5–5.3)
Sodium: 138 mmol/L (ref 135–146)

## 2019-08-01 LAB — C-REACTIVE PROTEIN: CRP: 5.5 mg/L (ref <8.0)

## 2019-08-01 LAB — SEDIMENTATION RATE: Sed Rate: 31 mm/h — ABNORMAL HIGH (ref 0–15)

## 2019-09-06 ENCOUNTER — Other Ambulatory Visit: Payer: Self-pay

## 2019-09-06 ENCOUNTER — Encounter: Payer: Self-pay | Admitting: Thoracic Surgery (Cardiothoracic Vascular Surgery)

## 2019-09-06 ENCOUNTER — Ambulatory Visit (INDEPENDENT_AMBULATORY_CARE_PROVIDER_SITE_OTHER): Payer: Medicare Other | Admitting: Thoracic Surgery (Cardiothoracic Vascular Surgery)

## 2019-09-06 VITALS — BP 134/78 | HR 65 | Temp 97.8°F | Resp 20 | Ht 75.0 in | Wt 386.0 lb

## 2019-09-06 DIAGNOSIS — J9859 Other diseases of mediastinum, not elsewhere classified: Secondary | ICD-10-CM | POA: Diagnosis not present

## 2019-09-06 DIAGNOSIS — R918 Other nonspecific abnormal finding of lung field: Secondary | ICD-10-CM | POA: Diagnosis not present

## 2019-09-06 NOTE — Progress Notes (Signed)
     [address]       [place] [address]             [phone]       [name] is a 48 year old gentleman that was originally seen as an inpatient consult for mediastinal nodularity thymic gland.  This case was discussed in tumor board and it was recommended that he undergo repeat imaging in 6 months.  He currently does not have any symptoms and is in agreement with the plan.  Repeat scan will be performed in 6 months.  We will follow up to review the results.

## 2020-02-03 ENCOUNTER — Other Ambulatory Visit: Payer: Self-pay | Admitting: Thoracic Surgery (Cardiothoracic Vascular Surgery)

## 2020-02-03 DIAGNOSIS — J9859 Other diseases of mediastinum, not elsewhere classified: Secondary | ICD-10-CM

## 2020-02-05 ENCOUNTER — Other Ambulatory Visit: Payer: Self-pay | Admitting: Thoracic Surgery (Cardiothoracic Vascular Surgery)

## 2020-03-19 ENCOUNTER — Ambulatory Visit: Payer: Medicare Other | Admitting: Thoracic Surgery (Cardiothoracic Vascular Surgery)

## 2020-03-20 ENCOUNTER — Ambulatory Visit: Payer: Medicare Other | Admitting: Thoracic Surgery (Cardiothoracic Vascular Surgery)

## 2020-03-20 ENCOUNTER — Other Ambulatory Visit: Payer: Medicare Other

## 2020-03-27 ENCOUNTER — Ambulatory Visit: Payer: Medicare Other | Admitting: Thoracic Surgery (Cardiothoracic Vascular Surgery)

## 2020-07-02 ENCOUNTER — Other Ambulatory Visit: Payer: Self-pay | Admitting: Internal Medicine

## 2020-07-02 ENCOUNTER — Other Ambulatory Visit: Payer: Self-pay

## 2020-07-02 ENCOUNTER — Ambulatory Visit
Admission: RE | Admit: 2020-07-02 | Discharge: 2020-07-02 | Disposition: A | Discharge status: Home / Self Care | Payer: Self-pay | Source: Ambulatory Visit | Attending: Internal Medicine | Admitting: Internal Medicine

## 2020-07-02 DIAGNOSIS — R2240 Localized swelling, mass and lump, unspecified lower limb: Secondary | ICD-10-CM

## 2020-12-17 ENCOUNTER — Other Ambulatory Visit: Payer: Self-pay | Admitting: Internal Medicine

## 2020-12-17 DIAGNOSIS — K7689 Other specified diseases of liver: Secondary | ICD-10-CM

## 2020-12-18 ENCOUNTER — Other Ambulatory Visit: Payer: Self-pay | Admitting: Internal Medicine

## 2020-12-18 ENCOUNTER — Ambulatory Visit
Admission: RE | Admit: 2020-12-18 | Discharge: 2020-12-18 | Disposition: A | Discharge status: Home / Self Care | Payer: Medicare Other | Source: Ambulatory Visit | Attending: Internal Medicine | Admitting: Internal Medicine

## 2020-12-18 DIAGNOSIS — K7689 Other specified diseases of liver: Secondary | ICD-10-CM

## 2020-12-18 MED ORDER — GADOBENATE DIMEGLUMINE 529 MG/ML IV SOLN
20.0000 mL | Freq: Once | INTRAVENOUS | Status: AC | PRN
  Administered 2020-12-18: 20 mL via INTRAVENOUS

## 2023-10-06 DIAGNOSIS — Z5181 Encounter for therapeutic drug level monitoring: Secondary | ICD-10-CM | POA: Diagnosis not present

## 2023-10-06 DIAGNOSIS — I119 Hypertensive heart disease without heart failure: Secondary | ICD-10-CM | POA: Diagnosis not present

## 2023-10-06 DIAGNOSIS — K3184 Gastroparesis: Secondary | ICD-10-CM | POA: Diagnosis not present

## 2023-10-06 DIAGNOSIS — M109 Gout, unspecified: Secondary | ICD-10-CM | POA: Diagnosis not present

## 2023-10-06 DIAGNOSIS — C220 Liver cell carcinoma: Secondary | ICD-10-CM | POA: Diagnosis not present

## 2023-10-06 DIAGNOSIS — G4733 Obstructive sleep apnea (adult) (pediatric): Secondary | ICD-10-CM | POA: Diagnosis not present

## 2023-10-06 DIAGNOSIS — J4540 Moderate persistent asthma, uncomplicated: Secondary | ICD-10-CM | POA: Diagnosis not present

## 2023-10-06 DIAGNOSIS — E559 Vitamin D deficiency, unspecified: Secondary | ICD-10-CM | POA: Diagnosis not present

## 2023-10-06 DIAGNOSIS — L02214 Cutaneous abscess of groin: Secondary | ICD-10-CM | POA: Diagnosis not present

## 2023-10-06 DIAGNOSIS — H65192 Other acute nonsuppurative otitis media, left ear: Secondary | ICD-10-CM | POA: Diagnosis not present

## 2023-10-06 DIAGNOSIS — E1165 Type 2 diabetes mellitus with hyperglycemia: Secondary | ICD-10-CM | POA: Diagnosis not present

## 2023-11-07 DIAGNOSIS — C7A8 Other malignant neuroendocrine tumors: Secondary | ICD-10-CM | POA: Diagnosis not present

## 2023-12-01 DIAGNOSIS — M109 Gout, unspecified: Secondary | ICD-10-CM | POA: Diagnosis not present

## 2023-12-01 DIAGNOSIS — E039 Hypothyroidism, unspecified: Secondary | ICD-10-CM | POA: Diagnosis not present

## 2023-12-01 DIAGNOSIS — J4540 Moderate persistent asthma, uncomplicated: Secondary | ICD-10-CM | POA: Diagnosis not present

## 2023-12-01 DIAGNOSIS — E1165 Type 2 diabetes mellitus with hyperglycemia: Secondary | ICD-10-CM | POA: Diagnosis not present

## 2023-12-01 DIAGNOSIS — H65192 Other acute nonsuppurative otitis media, left ear: Secondary | ICD-10-CM | POA: Diagnosis not present

## 2023-12-01 DIAGNOSIS — L02214 Cutaneous abscess of groin: Secondary | ICD-10-CM | POA: Diagnosis not present

## 2023-12-01 DIAGNOSIS — C220 Liver cell carcinoma: Secondary | ICD-10-CM | POA: Diagnosis not present

## 2023-12-01 DIAGNOSIS — K3184 Gastroparesis: Secondary | ICD-10-CM | POA: Diagnosis not present

## 2023-12-01 DIAGNOSIS — G4733 Obstructive sleep apnea (adult) (pediatric): Secondary | ICD-10-CM | POA: Diagnosis not present

## 2023-12-01 DIAGNOSIS — I119 Hypertensive heart disease without heart failure: Secondary | ICD-10-CM | POA: Diagnosis not present

## 2023-12-04 DIAGNOSIS — M25812 Other specified joint disorders, left shoulder: Secondary | ICD-10-CM | POA: Diagnosis not present

## 2023-12-04 DIAGNOSIS — M7542 Impingement syndrome of left shoulder: Secondary | ICD-10-CM | POA: Diagnosis not present

## 2023-12-04 DIAGNOSIS — S40912A Unspecified superficial injury of left shoulder, initial encounter: Secondary | ICD-10-CM | POA: Diagnosis not present

## 2023-12-04 DIAGNOSIS — G4730 Sleep apnea, unspecified: Secondary | ICD-10-CM | POA: Diagnosis not present

## 2023-12-04 DIAGNOSIS — Z87891 Personal history of nicotine dependence: Secondary | ICD-10-CM | POA: Diagnosis not present

## 2023-12-04 DIAGNOSIS — Z794 Long term (current) use of insulin: Secondary | ICD-10-CM | POA: Diagnosis not present

## 2023-12-04 DIAGNOSIS — C229 Malignant neoplasm of liver, not specified as primary or secondary: Secondary | ICD-10-CM | POA: Diagnosis not present

## 2023-12-04 DIAGNOSIS — Z7984 Long term (current) use of oral hypoglycemic drugs: Secondary | ICD-10-CM | POA: Diagnosis not present

## 2023-12-04 DIAGNOSIS — E785 Hyperlipidemia, unspecified: Secondary | ICD-10-CM | POA: Diagnosis not present

## 2023-12-04 DIAGNOSIS — I10 Essential (primary) hypertension: Secondary | ICD-10-CM | POA: Diagnosis not present

## 2023-12-04 DIAGNOSIS — Z91018 Allergy to other foods: Secondary | ICD-10-CM | POA: Diagnosis not present

## 2023-12-04 DIAGNOSIS — M109 Gout, unspecified: Secondary | ICD-10-CM | POA: Diagnosis not present

## 2023-12-04 DIAGNOSIS — Z79891 Long term (current) use of opiate analgesic: Secondary | ICD-10-CM | POA: Diagnosis not present

## 2023-12-04 DIAGNOSIS — J45909 Unspecified asthma, uncomplicated: Secondary | ICD-10-CM | POA: Diagnosis not present

## 2023-12-04 DIAGNOSIS — Z79899 Other long term (current) drug therapy: Secondary | ICD-10-CM | POA: Diagnosis not present

## 2023-12-04 DIAGNOSIS — E119 Type 2 diabetes mellitus without complications: Secondary | ICD-10-CM | POA: Diagnosis not present

## 2024-01-15 DIAGNOSIS — C7A8 Other malignant neuroendocrine tumors: Secondary | ICD-10-CM | POA: Diagnosis not present

## 2024-01-15 DIAGNOSIS — R160 Hepatomegaly, not elsewhere classified: Secondary | ICD-10-CM | POA: Diagnosis not present

## 2024-01-15 DIAGNOSIS — Z87891 Personal history of nicotine dependence: Secondary | ICD-10-CM | POA: Diagnosis not present

## 2024-01-15 DIAGNOSIS — C7B8 Other secondary neuroendocrine tumors: Secondary | ICD-10-CM | POA: Diagnosis not present

## 2024-01-24 DIAGNOSIS — M25512 Pain in left shoulder: Secondary | ICD-10-CM | POA: Diagnosis not present

## 2024-01-24 DIAGNOSIS — G8929 Other chronic pain: Secondary | ICD-10-CM | POA: Diagnosis not present

## 2024-02-09 DIAGNOSIS — I119 Hypertensive heart disease without heart failure: Secondary | ICD-10-CM | POA: Diagnosis not present

## 2024-02-09 DIAGNOSIS — E039 Hypothyroidism, unspecified: Secondary | ICD-10-CM | POA: Diagnosis not present

## 2024-02-09 DIAGNOSIS — G4733 Obstructive sleep apnea (adult) (pediatric): Secondary | ICD-10-CM | POA: Diagnosis not present

## 2024-02-09 DIAGNOSIS — E1165 Type 2 diabetes mellitus with hyperglycemia: Secondary | ICD-10-CM | POA: Diagnosis not present

## 2024-02-09 DIAGNOSIS — D649 Anemia, unspecified: Secondary | ICD-10-CM | POA: Diagnosis not present

## 2024-02-09 DIAGNOSIS — C220 Liver cell carcinoma: Secondary | ICD-10-CM | POA: Diagnosis not present

## 2024-02-09 DIAGNOSIS — K3184 Gastroparesis: Secondary | ICD-10-CM | POA: Diagnosis not present

## 2024-02-09 DIAGNOSIS — M109 Gout, unspecified: Secondary | ICD-10-CM | POA: Diagnosis not present

## 2024-02-09 DIAGNOSIS — H65192 Other acute nonsuppurative otitis media, left ear: Secondary | ICD-10-CM | POA: Diagnosis not present

## 2024-02-09 DIAGNOSIS — L02214 Cutaneous abscess of groin: Secondary | ICD-10-CM | POA: Diagnosis not present

## 2024-03-12 DIAGNOSIS — C7A8 Other malignant neuroendocrine tumors: Secondary | ICD-10-CM | POA: Diagnosis not present

## 2024-03-20 DIAGNOSIS — M7502 Adhesive capsulitis of left shoulder: Secondary | ICD-10-CM | POA: Diagnosis not present

## 2024-03-28 DIAGNOSIS — M7502 Adhesive capsulitis of left shoulder: Secondary | ICD-10-CM | POA: Diagnosis not present

## 2024-04-10 DIAGNOSIS — M7502 Adhesive capsulitis of left shoulder: Secondary | ICD-10-CM | POA: Diagnosis not present

## 2024-04-12 DIAGNOSIS — G4733 Obstructive sleep apnea (adult) (pediatric): Secondary | ICD-10-CM | POA: Diagnosis not present

## 2024-04-12 DIAGNOSIS — E039 Hypothyroidism, unspecified: Secondary | ICD-10-CM | POA: Diagnosis not present

## 2024-04-12 DIAGNOSIS — K3184 Gastroparesis: Secondary | ICD-10-CM | POA: Diagnosis not present

## 2024-04-12 DIAGNOSIS — I119 Hypertensive heart disease without heart failure: Secondary | ICD-10-CM | POA: Diagnosis not present

## 2024-04-12 DIAGNOSIS — J4540 Moderate persistent asthma, uncomplicated: Secondary | ICD-10-CM | POA: Diagnosis not present

## 2024-04-12 DIAGNOSIS — E1165 Type 2 diabetes mellitus with hyperglycemia: Secondary | ICD-10-CM | POA: Diagnosis not present

## 2024-04-12 DIAGNOSIS — L02211 Cutaneous abscess of abdominal wall: Secondary | ICD-10-CM | POA: Diagnosis not present

## 2024-04-12 DIAGNOSIS — M109 Gout, unspecified: Secondary | ICD-10-CM | POA: Diagnosis not present

## 2024-04-12 DIAGNOSIS — C220 Liver cell carcinoma: Secondary | ICD-10-CM | POA: Diagnosis not present

## 2024-04-16 DIAGNOSIS — R195 Other fecal abnormalities: Secondary | ICD-10-CM | POA: Diagnosis not present

## 2024-04-16 DIAGNOSIS — Z923 Personal history of irradiation: Secondary | ICD-10-CM | POA: Diagnosis not present

## 2024-04-16 DIAGNOSIS — R1084 Generalized abdominal pain: Secondary | ICD-10-CM | POA: Diagnosis not present

## 2024-04-16 DIAGNOSIS — R410 Disorientation, unspecified: Secondary | ICD-10-CM | POA: Diagnosis not present

## 2024-04-16 DIAGNOSIS — G4730 Sleep apnea, unspecified: Secondary | ICD-10-CM | POA: Diagnosis not present

## 2024-04-16 DIAGNOSIS — M109 Gout, unspecified: Secondary | ICD-10-CM | POA: Diagnosis not present

## 2024-04-16 DIAGNOSIS — E785 Hyperlipidemia, unspecified: Secondary | ICD-10-CM | POA: Diagnosis not present

## 2024-04-16 DIAGNOSIS — Z91018 Allergy to other foods: Secondary | ICD-10-CM | POA: Diagnosis not present

## 2024-04-16 DIAGNOSIS — Z79899 Other long term (current) drug therapy: Secondary | ICD-10-CM | POA: Diagnosis not present

## 2024-04-16 DIAGNOSIS — Z7984 Long term (current) use of oral hypoglycemic drugs: Secondary | ICD-10-CM | POA: Diagnosis not present

## 2024-04-16 DIAGNOSIS — R1030 Lower abdominal pain, unspecified: Secondary | ICD-10-CM | POA: Diagnosis not present

## 2024-04-16 DIAGNOSIS — K922 Gastrointestinal hemorrhage, unspecified: Secondary | ICD-10-CM | POA: Diagnosis not present

## 2024-04-16 DIAGNOSIS — K7689 Other specified diseases of liver: Secondary | ICD-10-CM | POA: Diagnosis not present

## 2024-04-16 DIAGNOSIS — Z87891 Personal history of nicotine dependence: Secondary | ICD-10-CM | POA: Diagnosis not present

## 2024-04-16 DIAGNOSIS — K625 Hemorrhage of anus and rectum: Secondary | ICD-10-CM | POA: Diagnosis not present

## 2024-04-16 DIAGNOSIS — I10 Essential (primary) hypertension: Secondary | ICD-10-CM | POA: Diagnosis not present

## 2024-04-16 DIAGNOSIS — C787 Secondary malignant neoplasm of liver and intrahepatic bile duct: Secondary | ICD-10-CM | POA: Diagnosis not present

## 2024-04-16 DIAGNOSIS — Z794 Long term (current) use of insulin: Secondary | ICD-10-CM | POA: Diagnosis not present

## 2024-04-16 DIAGNOSIS — E119 Type 2 diabetes mellitus without complications: Secondary | ICD-10-CM | POA: Diagnosis not present

## 2024-04-16 DIAGNOSIS — C169 Malignant neoplasm of stomach, unspecified: Secondary | ICD-10-CM | POA: Diagnosis not present

## 2024-05-11 DIAGNOSIS — M109 Gout, unspecified: Secondary | ICD-10-CM | POA: Diagnosis not present

## 2024-05-11 DIAGNOSIS — G4730 Sleep apnea, unspecified: Secondary | ICD-10-CM | POA: Diagnosis not present

## 2024-05-11 DIAGNOSIS — I10 Essential (primary) hypertension: Secondary | ICD-10-CM | POA: Diagnosis not present

## 2024-05-11 DIAGNOSIS — Z79899 Other long term (current) drug therapy: Secondary | ICD-10-CM | POA: Diagnosis not present

## 2024-05-11 DIAGNOSIS — Z794 Long term (current) use of insulin: Secondary | ICD-10-CM | POA: Diagnosis not present

## 2024-05-11 DIAGNOSIS — Z7984 Long term (current) use of oral hypoglycemic drugs: Secondary | ICD-10-CM | POA: Diagnosis not present

## 2024-05-11 DIAGNOSIS — E119 Type 2 diabetes mellitus without complications: Secondary | ICD-10-CM | POA: Diagnosis not present

## 2024-05-11 DIAGNOSIS — R310 Gross hematuria: Secondary | ICD-10-CM | POA: Diagnosis not present

## 2024-05-11 DIAGNOSIS — C799 Secondary malignant neoplasm of unspecified site: Secondary | ICD-10-CM | POA: Diagnosis not present

## 2024-05-11 DIAGNOSIS — C229 Malignant neoplasm of liver, not specified as primary or secondary: Secondary | ICD-10-CM | POA: Diagnosis not present

## 2024-05-11 DIAGNOSIS — E785 Hyperlipidemia, unspecified: Secondary | ICD-10-CM | POA: Diagnosis not present

## 2024-05-11 DIAGNOSIS — D649 Anemia, unspecified: Secondary | ICD-10-CM | POA: Diagnosis not present

## 2024-05-11 DIAGNOSIS — Z91018 Allergy to other foods: Secondary | ICD-10-CM | POA: Diagnosis not present

## 2024-05-11 DIAGNOSIS — Z87891 Personal history of nicotine dependence: Secondary | ICD-10-CM | POA: Diagnosis not present

## 2024-05-11 DIAGNOSIS — N3289 Other specified disorders of bladder: Secondary | ICD-10-CM | POA: Diagnosis not present

## 2024-05-11 DIAGNOSIS — N3001 Acute cystitis with hematuria: Secondary | ICD-10-CM | POA: Diagnosis not present

## 2024-05-12 DIAGNOSIS — N3289 Other specified disorders of bladder: Secondary | ICD-10-CM | POA: Diagnosis not present

## 2024-05-23 DIAGNOSIS — R310 Gross hematuria: Secondary | ICD-10-CM | POA: Diagnosis not present

## 2024-05-23 DIAGNOSIS — E1165 Type 2 diabetes mellitus with hyperglycemia: Secondary | ICD-10-CM | POA: Diagnosis not present

## 2024-05-23 DIAGNOSIS — R319 Hematuria, unspecified: Secondary | ICD-10-CM | POA: Diagnosis not present

## 2024-05-23 DIAGNOSIS — Z794 Long term (current) use of insulin: Secondary | ICD-10-CM | POA: Diagnosis not present

## 2024-05-23 DIAGNOSIS — E119 Type 2 diabetes mellitus without complications: Secondary | ICD-10-CM | POA: Diagnosis not present

## 2024-06-05 DIAGNOSIS — K6389 Other specified diseases of intestine: Secondary | ICD-10-CM | POA: Diagnosis not present

## 2024-06-05 DIAGNOSIS — K625 Hemorrhage of anus and rectum: Secondary | ICD-10-CM | POA: Diagnosis not present

## 2024-06-05 DIAGNOSIS — Z8589 Personal history of malignant neoplasm of other organs and systems: Secondary | ICD-10-CM | POA: Diagnosis not present

## 2024-07-05 DIAGNOSIS — E039 Hypothyroidism, unspecified: Secondary | ICD-10-CM | POA: Diagnosis not present

## 2024-07-05 DIAGNOSIS — G4733 Obstructive sleep apnea (adult) (pediatric): Secondary | ICD-10-CM | POA: Diagnosis not present

## 2024-07-05 DIAGNOSIS — L02211 Cutaneous abscess of abdominal wall: Secondary | ICD-10-CM | POA: Diagnosis not present

## 2024-07-05 DIAGNOSIS — C220 Liver cell carcinoma: Secondary | ICD-10-CM | POA: Diagnosis not present

## 2024-07-05 DIAGNOSIS — J4540 Moderate persistent asthma, uncomplicated: Secondary | ICD-10-CM | POA: Diagnosis not present

## 2024-07-05 DIAGNOSIS — I119 Hypertensive heart disease without heart failure: Secondary | ICD-10-CM | POA: Diagnosis not present

## 2024-07-05 DIAGNOSIS — M109 Gout, unspecified: Secondary | ICD-10-CM | POA: Diagnosis not present

## 2024-07-05 DIAGNOSIS — K3184 Gastroparesis: Secondary | ICD-10-CM | POA: Diagnosis not present

## 2024-07-05 DIAGNOSIS — E1165 Type 2 diabetes mellitus with hyperglycemia: Secondary | ICD-10-CM | POA: Diagnosis not present

## 2024-07-09 DIAGNOSIS — R310 Gross hematuria: Secondary | ICD-10-CM | POA: Diagnosis not present

## 2024-07-09 DIAGNOSIS — E1165 Type 2 diabetes mellitus with hyperglycemia: Secondary | ICD-10-CM | POA: Diagnosis not present

## 2024-07-09 DIAGNOSIS — Z794 Long term (current) use of insulin: Secondary | ICD-10-CM | POA: Diagnosis not present

## 2024-07-15 DIAGNOSIS — K769 Liver disease, unspecified: Secondary | ICD-10-CM | POA: Diagnosis not present

## 2024-07-15 DIAGNOSIS — C7B8 Other secondary neuroendocrine tumors: Secondary | ICD-10-CM | POA: Diagnosis not present

## 2024-07-15 DIAGNOSIS — C7A8 Other malignant neuroendocrine tumors: Secondary | ICD-10-CM | POA: Diagnosis not present
